# Patient Record
Sex: Male | Born: 1965 | Race: White | Hispanic: No | Marital: Married | Smoking: Current some day smoker
Health system: Southern US, Community
[De-identification: ages and names within clinical notes are randomized; demographics above are authoritative.]

## PROBLEM LIST (undated history)

## (undated) DIAGNOSIS — E11621 Type 2 diabetes mellitus with foot ulcer: Secondary | ICD-10-CM

## (undated) DIAGNOSIS — Z951 Presence of aortocoronary bypass graft: Secondary | ICD-10-CM

## (undated) DIAGNOSIS — G4733 Obstructive sleep apnea (adult) (pediatric): Secondary | ICD-10-CM

## (undated) DIAGNOSIS — I739 Peripheral vascular disease, unspecified: Secondary | ICD-10-CM

## (undated) DIAGNOSIS — Z8781 Personal history of (healed) traumatic fracture: Secondary | ICD-10-CM

## (undated) DIAGNOSIS — F32A Depression, unspecified: Secondary | ICD-10-CM

## (undated) DIAGNOSIS — Z972 Presence of dental prosthetic device (complete) (partial): Secondary | ICD-10-CM

## (undated) DIAGNOSIS — Z8739 Personal history of other diseases of the musculoskeletal system and connective tissue: Secondary | ICD-10-CM

## (undated) DIAGNOSIS — E119 Type 2 diabetes mellitus without complications: Secondary | ICD-10-CM

## (undated) DIAGNOSIS — I509 Heart failure, unspecified: Secondary | ICD-10-CM

## (undated) DIAGNOSIS — M542 Cervicalgia: Secondary | ICD-10-CM

## (undated) DIAGNOSIS — I208 Other forms of angina pectoris: Secondary | ICD-10-CM

## (undated) DIAGNOSIS — E782 Mixed hyperlipidemia: Secondary | ICD-10-CM

## (undated) DIAGNOSIS — R200 Anesthesia of skin: Secondary | ICD-10-CM

## (undated) DIAGNOSIS — Z955 Presence of coronary angioplasty implant and graft: Secondary | ICD-10-CM

## (undated) DIAGNOSIS — I2510 Atherosclerotic heart disease of native coronary artery without angina pectoris: Secondary | ICD-10-CM

## (undated) DIAGNOSIS — M069 Rheumatoid arthritis, unspecified: Secondary | ICD-10-CM

## (undated) DIAGNOSIS — R202 Paresthesia of skin: Secondary | ICD-10-CM

## (undated) DIAGNOSIS — I252 Old myocardial infarction: Secondary | ICD-10-CM

## (undated) DIAGNOSIS — F319 Bipolar disorder, unspecified: Secondary | ICD-10-CM

## (undated) DIAGNOSIS — F329 Major depressive disorder, single episode, unspecified: Secondary | ICD-10-CM

## (undated) DIAGNOSIS — R399 Unspecified symptoms and signs involving the genitourinary system: Secondary | ICD-10-CM

## (undated) DIAGNOSIS — L97509 Non-pressure chronic ulcer of other part of unspecified foot with unspecified severity: Secondary | ICD-10-CM

## (undated) DIAGNOSIS — Z87898 Personal history of other specified conditions: Secondary | ICD-10-CM

## (undated) DIAGNOSIS — K219 Gastro-esophageal reflux disease without esophagitis: Secondary | ICD-10-CM

## (undated) DIAGNOSIS — I2089 Other forms of angina pectoris: Secondary | ICD-10-CM

## (undated) DIAGNOSIS — G894 Chronic pain syndrome: Secondary | ICD-10-CM

## (undated) DIAGNOSIS — I255 Ischemic cardiomyopathy: Secondary | ICD-10-CM

## (undated) DIAGNOSIS — F1991 Other psychoactive substance use, unspecified, in remission: Secondary | ICD-10-CM

## (undated) DIAGNOSIS — N529 Male erectile dysfunction, unspecified: Secondary | ICD-10-CM

## (undated) DIAGNOSIS — F1190 Opioid use, unspecified, uncomplicated: Secondary | ICD-10-CM

## (undated) DIAGNOSIS — M869 Osteomyelitis, unspecified: Secondary | ICD-10-CM

## (undated) DIAGNOSIS — G5603 Carpal tunnel syndrome, bilateral upper limbs: Secondary | ICD-10-CM

## (undated) HISTORY — PX: LAPAROSCOPIC CHOLECYSTECTOMY: SUR755

## (undated) HISTORY — PX: TRANSTHORACIC ECHOCARDIOGRAM: SHX275

## (undated) HISTORY — PX: NASAL SINUS SURGERY: SHX719

## (undated) HISTORY — DX: Carpal tunnel syndrome, bilateral upper limbs: G56.03

## (undated) HISTORY — PX: CARDIOVASCULAR STRESS TEST: SHX262

## (undated) HISTORY — PX: TONSILLECTOMY: SUR1361

---

## 2003-12-19 HISTORY — PX: CERVICAL LAMINECTOMY: SHX94

## 2004-12-18 HISTORY — PX: OTHER SURGICAL HISTORY: SHX169

## 2013-03-10 DIAGNOSIS — M9981 Other biomechanical lesions of cervical region: Secondary | ICD-10-CM | POA: Diagnosis not present

## 2013-03-10 DIAGNOSIS — M4712 Other spondylosis with myelopathy, cervical region: Secondary | ICD-10-CM | POA: Diagnosis not present

## 2013-03-10 DIAGNOSIS — M542 Cervicalgia: Secondary | ICD-10-CM | POA: Diagnosis not present

## 2013-03-10 DIAGNOSIS — M5030 Other cervical disc degeneration, unspecified cervical region: Secondary | ICD-10-CM | POA: Diagnosis not present

## 2013-03-11 DIAGNOSIS — M5030 Other cervical disc degeneration, unspecified cervical region: Secondary | ICD-10-CM | POA: Diagnosis not present

## 2013-03-11 DIAGNOSIS — M4712 Other spondylosis with myelopathy, cervical region: Secondary | ICD-10-CM | POA: Diagnosis not present

## 2013-03-11 DIAGNOSIS — M542 Cervicalgia: Secondary | ICD-10-CM | POA: Diagnosis not present

## 2013-03-11 DIAGNOSIS — M9981 Other biomechanical lesions of cervical region: Secondary | ICD-10-CM | POA: Diagnosis not present

## 2013-03-17 DIAGNOSIS — M9981 Other biomechanical lesions of cervical region: Secondary | ICD-10-CM | POA: Diagnosis not present

## 2013-03-17 DIAGNOSIS — M542 Cervicalgia: Secondary | ICD-10-CM | POA: Diagnosis not present

## 2013-03-17 DIAGNOSIS — M4712 Other spondylosis with myelopathy, cervical region: Secondary | ICD-10-CM | POA: Diagnosis not present

## 2013-03-17 DIAGNOSIS — M5030 Other cervical disc degeneration, unspecified cervical region: Secondary | ICD-10-CM | POA: Diagnosis not present

## 2013-03-20 DIAGNOSIS — M5030 Other cervical disc degeneration, unspecified cervical region: Secondary | ICD-10-CM | POA: Diagnosis not present

## 2013-03-20 DIAGNOSIS — M542 Cervicalgia: Secondary | ICD-10-CM | POA: Diagnosis not present

## 2013-03-20 DIAGNOSIS — M4712 Other spondylosis with myelopathy, cervical region: Secondary | ICD-10-CM | POA: Diagnosis not present

## 2013-03-20 DIAGNOSIS — M9981 Other biomechanical lesions of cervical region: Secondary | ICD-10-CM | POA: Diagnosis not present

## 2013-03-25 DIAGNOSIS — N529 Male erectile dysfunction, unspecified: Secondary | ICD-10-CM | POA: Diagnosis not present

## 2013-03-25 DIAGNOSIS — M5030 Other cervical disc degeneration, unspecified cervical region: Secondary | ICD-10-CM | POA: Diagnosis not present

## 2013-03-25 DIAGNOSIS — G4730 Sleep apnea, unspecified: Secondary | ICD-10-CM | POA: Diagnosis not present

## 2013-03-25 DIAGNOSIS — M4712 Other spondylosis with myelopathy, cervical region: Secondary | ICD-10-CM | POA: Diagnosis not present

## 2013-03-25 DIAGNOSIS — M9981 Other biomechanical lesions of cervical region: Secondary | ICD-10-CM | POA: Diagnosis not present

## 2013-03-25 DIAGNOSIS — M542 Cervicalgia: Secondary | ICD-10-CM | POA: Diagnosis not present

## 2013-03-25 DIAGNOSIS — R5381 Other malaise: Secondary | ICD-10-CM | POA: Diagnosis not present

## 2013-03-25 DIAGNOSIS — E119 Type 2 diabetes mellitus without complications: Secondary | ICD-10-CM | POA: Diagnosis not present

## 2013-03-27 DIAGNOSIS — M4712 Other spondylosis with myelopathy, cervical region: Secondary | ICD-10-CM | POA: Diagnosis not present

## 2013-03-27 DIAGNOSIS — M5030 Other cervical disc degeneration, unspecified cervical region: Secondary | ICD-10-CM | POA: Diagnosis not present

## 2013-03-27 DIAGNOSIS — M542 Cervicalgia: Secondary | ICD-10-CM | POA: Diagnosis not present

## 2013-03-27 DIAGNOSIS — M9981 Other biomechanical lesions of cervical region: Secondary | ICD-10-CM | POA: Diagnosis not present

## 2013-04-02 DIAGNOSIS — M4712 Other spondylosis with myelopathy, cervical region: Secondary | ICD-10-CM | POA: Diagnosis not present

## 2013-04-02 DIAGNOSIS — M5030 Other cervical disc degeneration, unspecified cervical region: Secondary | ICD-10-CM | POA: Diagnosis not present

## 2013-04-02 DIAGNOSIS — M542 Cervicalgia: Secondary | ICD-10-CM | POA: Diagnosis not present

## 2013-04-02 DIAGNOSIS — M9981 Other biomechanical lesions of cervical region: Secondary | ICD-10-CM | POA: Diagnosis not present

## 2013-04-03 DIAGNOSIS — M9981 Other biomechanical lesions of cervical region: Secondary | ICD-10-CM | POA: Diagnosis not present

## 2013-04-03 DIAGNOSIS — M5030 Other cervical disc degeneration, unspecified cervical region: Secondary | ICD-10-CM | POA: Diagnosis not present

## 2013-04-03 DIAGNOSIS — M542 Cervicalgia: Secondary | ICD-10-CM | POA: Diagnosis not present

## 2013-04-03 DIAGNOSIS — M4712 Other spondylosis with myelopathy, cervical region: Secondary | ICD-10-CM | POA: Diagnosis not present

## 2013-04-08 DIAGNOSIS — M4712 Other spondylosis with myelopathy, cervical region: Secondary | ICD-10-CM | POA: Diagnosis not present

## 2013-04-08 DIAGNOSIS — M5030 Other cervical disc degeneration, unspecified cervical region: Secondary | ICD-10-CM | POA: Diagnosis not present

## 2013-04-08 DIAGNOSIS — M542 Cervicalgia: Secondary | ICD-10-CM | POA: Diagnosis not present

## 2013-04-08 DIAGNOSIS — M9981 Other biomechanical lesions of cervical region: Secondary | ICD-10-CM | POA: Diagnosis not present

## 2013-04-10 DIAGNOSIS — M9981 Other biomechanical lesions of cervical region: Secondary | ICD-10-CM | POA: Diagnosis not present

## 2013-04-10 DIAGNOSIS — M5030 Other cervical disc degeneration, unspecified cervical region: Secondary | ICD-10-CM | POA: Diagnosis not present

## 2013-04-10 DIAGNOSIS — M542 Cervicalgia: Secondary | ICD-10-CM | POA: Diagnosis not present

## 2013-04-10 DIAGNOSIS — M4712 Other spondylosis with myelopathy, cervical region: Secondary | ICD-10-CM | POA: Diagnosis not present

## 2013-04-15 DIAGNOSIS — M542 Cervicalgia: Secondary | ICD-10-CM | POA: Diagnosis not present

## 2013-04-15 DIAGNOSIS — M5030 Other cervical disc degeneration, unspecified cervical region: Secondary | ICD-10-CM | POA: Diagnosis not present

## 2013-04-15 DIAGNOSIS — M4712 Other spondylosis with myelopathy, cervical region: Secondary | ICD-10-CM | POA: Diagnosis not present

## 2013-04-15 DIAGNOSIS — M9981 Other biomechanical lesions of cervical region: Secondary | ICD-10-CM | POA: Diagnosis not present

## 2013-04-17 DIAGNOSIS — M542 Cervicalgia: Secondary | ICD-10-CM | POA: Diagnosis not present

## 2013-04-17 DIAGNOSIS — M5030 Other cervical disc degeneration, unspecified cervical region: Secondary | ICD-10-CM | POA: Diagnosis not present

## 2013-04-17 DIAGNOSIS — M4712 Other spondylosis with myelopathy, cervical region: Secondary | ICD-10-CM | POA: Diagnosis not present

## 2013-04-17 DIAGNOSIS — M9981 Other biomechanical lesions of cervical region: Secondary | ICD-10-CM | POA: Diagnosis not present

## 2013-04-17 DIAGNOSIS — N529 Male erectile dysfunction, unspecified: Secondary | ICD-10-CM | POA: Diagnosis not present

## 2013-04-22 DIAGNOSIS — M9981 Other biomechanical lesions of cervical region: Secondary | ICD-10-CM | POA: Diagnosis not present

## 2013-04-22 DIAGNOSIS — M542 Cervicalgia: Secondary | ICD-10-CM | POA: Diagnosis not present

## 2013-04-22 DIAGNOSIS — M5030 Other cervical disc degeneration, unspecified cervical region: Secondary | ICD-10-CM | POA: Diagnosis not present

## 2013-04-22 DIAGNOSIS — M4712 Other spondylosis with myelopathy, cervical region: Secondary | ICD-10-CM | POA: Diagnosis not present

## 2013-04-22 DIAGNOSIS — G4730 Sleep apnea, unspecified: Secondary | ICD-10-CM | POA: Diagnosis not present

## 2013-04-24 DIAGNOSIS — M9981 Other biomechanical lesions of cervical region: Secondary | ICD-10-CM | POA: Diagnosis not present

## 2013-04-24 DIAGNOSIS — M542 Cervicalgia: Secondary | ICD-10-CM | POA: Diagnosis not present

## 2013-04-24 DIAGNOSIS — M5030 Other cervical disc degeneration, unspecified cervical region: Secondary | ICD-10-CM | POA: Diagnosis not present

## 2013-04-24 DIAGNOSIS — M4712 Other spondylosis with myelopathy, cervical region: Secondary | ICD-10-CM | POA: Diagnosis not present

## 2013-04-29 DIAGNOSIS — M5030 Other cervical disc degeneration, unspecified cervical region: Secondary | ICD-10-CM | POA: Diagnosis not present

## 2013-04-29 DIAGNOSIS — M4712 Other spondylosis with myelopathy, cervical region: Secondary | ICD-10-CM | POA: Diagnosis not present

## 2013-04-29 DIAGNOSIS — M9981 Other biomechanical lesions of cervical region: Secondary | ICD-10-CM | POA: Diagnosis not present

## 2013-04-29 DIAGNOSIS — M542 Cervicalgia: Secondary | ICD-10-CM | POA: Diagnosis not present

## 2013-05-01 DIAGNOSIS — M9981 Other biomechanical lesions of cervical region: Secondary | ICD-10-CM | POA: Diagnosis not present

## 2013-05-01 DIAGNOSIS — M4712 Other spondylosis with myelopathy, cervical region: Secondary | ICD-10-CM | POA: Diagnosis not present

## 2013-05-01 DIAGNOSIS — M5030 Other cervical disc degeneration, unspecified cervical region: Secondary | ICD-10-CM | POA: Diagnosis not present

## 2013-05-01 DIAGNOSIS — M542 Cervicalgia: Secondary | ICD-10-CM | POA: Diagnosis not present

## 2013-05-06 DIAGNOSIS — M542 Cervicalgia: Secondary | ICD-10-CM | POA: Diagnosis not present

## 2013-05-06 DIAGNOSIS — M9981 Other biomechanical lesions of cervical region: Secondary | ICD-10-CM | POA: Diagnosis not present

## 2013-05-06 DIAGNOSIS — M5030 Other cervical disc degeneration, unspecified cervical region: Secondary | ICD-10-CM | POA: Diagnosis not present

## 2013-05-06 DIAGNOSIS — M4712 Other spondylosis with myelopathy, cervical region: Secondary | ICD-10-CM | POA: Diagnosis not present

## 2013-05-08 DIAGNOSIS — M542 Cervicalgia: Secondary | ICD-10-CM | POA: Diagnosis not present

## 2013-05-08 DIAGNOSIS — M4712 Other spondylosis with myelopathy, cervical region: Secondary | ICD-10-CM | POA: Diagnosis not present

## 2013-05-08 DIAGNOSIS — M9981 Other biomechanical lesions of cervical region: Secondary | ICD-10-CM | POA: Diagnosis not present

## 2013-05-08 DIAGNOSIS — M5030 Other cervical disc degeneration, unspecified cervical region: Secondary | ICD-10-CM | POA: Diagnosis not present

## 2013-06-18 DIAGNOSIS — L608 Other nail disorders: Secondary | ICD-10-CM | POA: Diagnosis not present

## 2013-06-18 DIAGNOSIS — E1149 Type 2 diabetes mellitus with other diabetic neurological complication: Secondary | ICD-10-CM | POA: Diagnosis not present

## 2013-06-18 DIAGNOSIS — M2020 Hallux rigidus, unspecified foot: Secondary | ICD-10-CM | POA: Diagnosis not present

## 2013-06-18 DIAGNOSIS — L97509 Non-pressure chronic ulcer of other part of unspecified foot with unspecified severity: Secondary | ICD-10-CM | POA: Diagnosis not present

## 2013-07-18 DIAGNOSIS — N529 Male erectile dysfunction, unspecified: Secondary | ICD-10-CM | POA: Diagnosis not present

## 2013-07-29 DIAGNOSIS — M999 Biomechanical lesion, unspecified: Secondary | ICD-10-CM | POA: Diagnosis not present

## 2013-07-29 DIAGNOSIS — M545 Low back pain: Secondary | ICD-10-CM | POA: Diagnosis not present

## 2013-07-29 DIAGNOSIS — M5137 Other intervertebral disc degeneration, lumbosacral region: Secondary | ICD-10-CM | POA: Diagnosis not present

## 2013-07-29 DIAGNOSIS — M4716 Other spondylosis with myelopathy, lumbar region: Secondary | ICD-10-CM | POA: Diagnosis not present

## 2013-08-04 DIAGNOSIS — M999 Biomechanical lesion, unspecified: Secondary | ICD-10-CM | POA: Diagnosis not present

## 2013-08-04 DIAGNOSIS — M5137 Other intervertebral disc degeneration, lumbosacral region: Secondary | ICD-10-CM | POA: Diagnosis not present

## 2013-08-04 DIAGNOSIS — M4716 Other spondylosis with myelopathy, lumbar region: Secondary | ICD-10-CM | POA: Diagnosis not present

## 2013-08-04 DIAGNOSIS — M545 Low back pain: Secondary | ICD-10-CM | POA: Diagnosis not present

## 2013-08-21 DIAGNOSIS — M5137 Other intervertebral disc degeneration, lumbosacral region: Secondary | ICD-10-CM | POA: Diagnosis not present

## 2013-08-21 DIAGNOSIS — M999 Biomechanical lesion, unspecified: Secondary | ICD-10-CM | POA: Diagnosis not present

## 2013-08-21 DIAGNOSIS — E1149 Type 2 diabetes mellitus with other diabetic neurological complication: Secondary | ICD-10-CM | POA: Diagnosis not present

## 2013-08-21 DIAGNOSIS — M545 Low back pain: Secondary | ICD-10-CM | POA: Diagnosis not present

## 2013-08-21 DIAGNOSIS — M4716 Other spondylosis with myelopathy, lumbar region: Secondary | ICD-10-CM | POA: Diagnosis not present

## 2013-08-21 DIAGNOSIS — L608 Other nail disorders: Secondary | ICD-10-CM | POA: Diagnosis not present

## 2013-09-01 DIAGNOSIS — G4730 Sleep apnea, unspecified: Secondary | ICD-10-CM | POA: Diagnosis not present

## 2013-09-01 DIAGNOSIS — R5381 Other malaise: Secondary | ICD-10-CM | POA: Diagnosis not present

## 2013-09-01 DIAGNOSIS — E119 Type 2 diabetes mellitus without complications: Secondary | ICD-10-CM | POA: Diagnosis not present

## 2013-09-04 DIAGNOSIS — M4716 Other spondylosis with myelopathy, lumbar region: Secondary | ICD-10-CM | POA: Diagnosis not present

## 2013-09-04 DIAGNOSIS — M999 Biomechanical lesion, unspecified: Secondary | ICD-10-CM | POA: Diagnosis not present

## 2013-09-04 DIAGNOSIS — M545 Low back pain: Secondary | ICD-10-CM | POA: Diagnosis not present

## 2013-09-04 DIAGNOSIS — M5137 Other intervertebral disc degeneration, lumbosacral region: Secondary | ICD-10-CM | POA: Diagnosis not present

## 2013-09-08 DIAGNOSIS — E119 Type 2 diabetes mellitus without complications: Secondary | ICD-10-CM | POA: Diagnosis not present

## 2013-09-08 DIAGNOSIS — G4730 Sleep apnea, unspecified: Secondary | ICD-10-CM | POA: Diagnosis not present

## 2013-09-08 DIAGNOSIS — F319 Bipolar disorder, unspecified: Secondary | ICD-10-CM | POA: Diagnosis not present

## 2013-09-08 DIAGNOSIS — N529 Male erectile dysfunction, unspecified: Secondary | ICD-10-CM | POA: Diagnosis not present

## 2013-09-25 DIAGNOSIS — M545 Low back pain: Secondary | ICD-10-CM | POA: Diagnosis not present

## 2013-09-25 DIAGNOSIS — M4716 Other spondylosis with myelopathy, lumbar region: Secondary | ICD-10-CM | POA: Diagnosis not present

## 2013-09-25 DIAGNOSIS — M999 Biomechanical lesion, unspecified: Secondary | ICD-10-CM | POA: Diagnosis not present

## 2013-09-25 DIAGNOSIS — M5137 Other intervertebral disc degeneration, lumbosacral region: Secondary | ICD-10-CM | POA: Diagnosis not present

## 2013-10-09 DIAGNOSIS — M4716 Other spondylosis with myelopathy, lumbar region: Secondary | ICD-10-CM | POA: Diagnosis not present

## 2013-10-09 DIAGNOSIS — M999 Biomechanical lesion, unspecified: Secondary | ICD-10-CM | POA: Diagnosis not present

## 2013-10-09 DIAGNOSIS — M545 Low back pain: Secondary | ICD-10-CM | POA: Diagnosis not present

## 2013-10-09 DIAGNOSIS — M5137 Other intervertebral disc degeneration, lumbosacral region: Secondary | ICD-10-CM | POA: Diagnosis not present

## 2014-02-19 DIAGNOSIS — E119 Type 2 diabetes mellitus without complications: Secondary | ICD-10-CM | POA: Diagnosis not present

## 2014-02-19 DIAGNOSIS — E785 Hyperlipidemia, unspecified: Secondary | ICD-10-CM | POA: Diagnosis not present

## 2014-02-26 DIAGNOSIS — N529 Male erectile dysfunction, unspecified: Secondary | ICD-10-CM | POA: Diagnosis not present

## 2014-02-26 DIAGNOSIS — G4730 Sleep apnea, unspecified: Secondary | ICD-10-CM | POA: Diagnosis not present

## 2014-02-26 DIAGNOSIS — IMO0001 Reserved for inherently not codable concepts without codable children: Secondary | ICD-10-CM | POA: Diagnosis not present

## 2014-02-26 DIAGNOSIS — E785 Hyperlipidemia, unspecified: Secondary | ICD-10-CM | POA: Diagnosis not present

## 2014-03-13 DIAGNOSIS — F3132 Bipolar disorder, current episode depressed, moderate: Secondary | ICD-10-CM | POA: Diagnosis not present

## 2014-04-28 DIAGNOSIS — F3132 Bipolar disorder, current episode depressed, moderate: Secondary | ICD-10-CM | POA: Diagnosis not present

## 2014-05-05 DIAGNOSIS — F3132 Bipolar disorder, current episode depressed, moderate: Secondary | ICD-10-CM | POA: Diagnosis not present

## 2014-05-26 DIAGNOSIS — F3132 Bipolar disorder, current episode depressed, moderate: Secondary | ICD-10-CM | POA: Diagnosis not present

## 2014-06-04 DIAGNOSIS — Z125 Encounter for screening for malignant neoplasm of prostate: Secondary | ICD-10-CM | POA: Diagnosis not present

## 2014-06-04 DIAGNOSIS — R5381 Other malaise: Secondary | ICD-10-CM | POA: Diagnosis not present

## 2014-06-04 DIAGNOSIS — E785 Hyperlipidemia, unspecified: Secondary | ICD-10-CM | POA: Diagnosis not present

## 2014-06-04 DIAGNOSIS — R5383 Other fatigue: Secondary | ICD-10-CM | POA: Diagnosis not present

## 2014-06-04 DIAGNOSIS — E119 Type 2 diabetes mellitus without complications: Secondary | ICD-10-CM | POA: Diagnosis not present

## 2014-06-11 DIAGNOSIS — E119 Type 2 diabetes mellitus without complications: Secondary | ICD-10-CM | POA: Diagnosis not present

## 2014-06-11 DIAGNOSIS — G4730 Sleep apnea, unspecified: Secondary | ICD-10-CM | POA: Diagnosis not present

## 2014-06-11 DIAGNOSIS — N529 Male erectile dysfunction, unspecified: Secondary | ICD-10-CM | POA: Diagnosis not present

## 2014-06-11 DIAGNOSIS — E785 Hyperlipidemia, unspecified: Secondary | ICD-10-CM | POA: Diagnosis not present

## 2014-06-30 DIAGNOSIS — F3132 Bipolar disorder, current episode depressed, moderate: Secondary | ICD-10-CM | POA: Diagnosis not present

## 2014-07-01 DIAGNOSIS — F3132 Bipolar disorder, current episode depressed, moderate: Secondary | ICD-10-CM | POA: Diagnosis not present

## 2014-07-07 DIAGNOSIS — F3132 Bipolar disorder, current episode depressed, moderate: Secondary | ICD-10-CM | POA: Diagnosis not present

## 2014-07-09 DIAGNOSIS — M79609 Pain in unspecified limb: Secondary | ICD-10-CM | POA: Diagnosis not present

## 2014-07-09 DIAGNOSIS — L84 Corns and callosities: Secondary | ICD-10-CM | POA: Diagnosis not present

## 2014-07-09 DIAGNOSIS — M2010 Hallux valgus (acquired), unspecified foot: Secondary | ICD-10-CM | POA: Diagnosis not present

## 2014-07-09 DIAGNOSIS — E1149 Type 2 diabetes mellitus with other diabetic neurological complication: Secondary | ICD-10-CM | POA: Diagnosis not present

## 2014-07-14 DIAGNOSIS — F3132 Bipolar disorder, current episode depressed, moderate: Secondary | ICD-10-CM | POA: Diagnosis not present

## 2014-08-18 DIAGNOSIS — F3132 Bipolar disorder, current episode depressed, moderate: Secondary | ICD-10-CM | POA: Diagnosis not present

## 2014-08-18 DIAGNOSIS — F4310 Post-traumatic stress disorder, unspecified: Secondary | ICD-10-CM | POA: Diagnosis not present

## 2014-09-01 DIAGNOSIS — F4310 Post-traumatic stress disorder, unspecified: Secondary | ICD-10-CM | POA: Diagnosis not present

## 2014-09-01 DIAGNOSIS — F3132 Bipolar disorder, current episode depressed, moderate: Secondary | ICD-10-CM | POA: Diagnosis not present

## 2014-09-18 DIAGNOSIS — E785 Hyperlipidemia, unspecified: Secondary | ICD-10-CM | POA: Diagnosis not present

## 2014-09-18 DIAGNOSIS — E119 Type 2 diabetes mellitus without complications: Secondary | ICD-10-CM | POA: Diagnosis not present

## 2014-09-18 DIAGNOSIS — E780 Pure hypercholesterolemia: Secondary | ICD-10-CM | POA: Diagnosis not present

## 2014-09-24 DIAGNOSIS — E1165 Type 2 diabetes mellitus with hyperglycemia: Secondary | ICD-10-CM | POA: Diagnosis not present

## 2014-09-24 DIAGNOSIS — N5201 Erectile dysfunction due to arterial insufficiency: Secondary | ICD-10-CM | POA: Diagnosis not present

## 2014-09-24 DIAGNOSIS — E782 Mixed hyperlipidemia: Secondary | ICD-10-CM | POA: Diagnosis not present

## 2014-09-24 DIAGNOSIS — M159 Polyosteoarthritis, unspecified: Secondary | ICD-10-CM | POA: Diagnosis not present

## 2014-12-04 DIAGNOSIS — F3132 Bipolar disorder, current episode depressed, moderate: Secondary | ICD-10-CM | POA: Diagnosis not present

## 2014-12-07 DIAGNOSIS — E1165 Type 2 diabetes mellitus with hyperglycemia: Secondary | ICD-10-CM | POA: Diagnosis not present

## 2014-12-07 DIAGNOSIS — M159 Polyosteoarthritis, unspecified: Secondary | ICD-10-CM | POA: Diagnosis not present

## 2014-12-30 DIAGNOSIS — F3132 Bipolar disorder, current episode depressed, moderate: Secondary | ICD-10-CM | POA: Diagnosis not present

## 2015-01-13 DIAGNOSIS — F3132 Bipolar disorder, current episode depressed, moderate: Secondary | ICD-10-CM | POA: Diagnosis not present

## 2015-01-19 DIAGNOSIS — E1165 Type 2 diabetes mellitus with hyperglycemia: Secondary | ICD-10-CM | POA: Diagnosis not present

## 2015-01-19 DIAGNOSIS — E782 Mixed hyperlipidemia: Secondary | ICD-10-CM | POA: Diagnosis not present

## 2015-01-26 DIAGNOSIS — E1165 Type 2 diabetes mellitus with hyperglycemia: Secondary | ICD-10-CM | POA: Diagnosis not present

## 2015-01-26 DIAGNOSIS — E782 Mixed hyperlipidemia: Secondary | ICD-10-CM | POA: Diagnosis not present

## 2015-01-26 DIAGNOSIS — N5201 Erectile dysfunction due to arterial insufficiency: Secondary | ICD-10-CM | POA: Diagnosis not present

## 2015-02-08 DIAGNOSIS — F3132 Bipolar disorder, current episode depressed, moderate: Secondary | ICD-10-CM | POA: Diagnosis not present

## 2015-02-17 DIAGNOSIS — F3132 Bipolar disorder, current episode depressed, moderate: Secondary | ICD-10-CM | POA: Diagnosis not present

## 2015-03-03 DIAGNOSIS — F3132 Bipolar disorder, current episode depressed, moderate: Secondary | ICD-10-CM | POA: Diagnosis not present

## 2015-03-04 DIAGNOSIS — F314 Bipolar disorder, current episode depressed, severe, without psychotic features: Secondary | ICD-10-CM | POA: Diagnosis not present

## 2015-03-22 DIAGNOSIS — F3132 Bipolar disorder, current episode depressed, moderate: Secondary | ICD-10-CM | POA: Diagnosis not present

## 2015-03-31 DIAGNOSIS — F3132 Bipolar disorder, current episode depressed, moderate: Secondary | ICD-10-CM | POA: Diagnosis not present

## 2015-04-12 DIAGNOSIS — F3132 Bipolar disorder, current episode depressed, moderate: Secondary | ICD-10-CM | POA: Diagnosis not present

## 2015-04-21 DIAGNOSIS — F3132 Bipolar disorder, current episode depressed, moderate: Secondary | ICD-10-CM | POA: Diagnosis not present

## 2015-05-04 DIAGNOSIS — E1165 Type 2 diabetes mellitus with hyperglycemia: Secondary | ICD-10-CM | POA: Diagnosis not present

## 2015-05-04 DIAGNOSIS — E782 Mixed hyperlipidemia: Secondary | ICD-10-CM | POA: Diagnosis not present

## 2015-05-04 DIAGNOSIS — N5201 Erectile dysfunction due to arterial insufficiency: Secondary | ICD-10-CM | POA: Diagnosis not present

## 2015-05-11 DIAGNOSIS — F3132 Bipolar disorder, current episode depressed, moderate: Secondary | ICD-10-CM | POA: Diagnosis not present

## 2015-05-11 DIAGNOSIS — E1165 Type 2 diabetes mellitus with hyperglycemia: Secondary | ICD-10-CM | POA: Diagnosis not present

## 2015-05-11 DIAGNOSIS — M159 Polyosteoarthritis, unspecified: Secondary | ICD-10-CM | POA: Diagnosis not present

## 2015-05-11 DIAGNOSIS — M2550 Pain in unspecified joint: Secondary | ICD-10-CM | POA: Diagnosis not present

## 2015-05-11 DIAGNOSIS — R634 Abnormal weight loss: Secondary | ICD-10-CM | POA: Diagnosis not present

## 2015-05-11 DIAGNOSIS — E782 Mixed hyperlipidemia: Secondary | ICD-10-CM | POA: Diagnosis not present

## 2015-05-11 DIAGNOSIS — M791 Myalgia: Secondary | ICD-10-CM | POA: Diagnosis not present

## 2015-05-18 ENCOUNTER — Other Ambulatory Visit: Payer: Self-pay | Admitting: Internal Medicine

## 2015-05-18 DIAGNOSIS — M159 Polyosteoarthritis, unspecified: Secondary | ICD-10-CM | POA: Diagnosis not present

## 2015-05-18 DIAGNOSIS — M2550 Pain in unspecified joint: Secondary | ICD-10-CM | POA: Diagnosis not present

## 2015-05-18 DIAGNOSIS — R634 Abnormal weight loss: Secondary | ICD-10-CM | POA: Diagnosis not present

## 2015-05-18 DIAGNOSIS — Z202 Contact with and (suspected) exposure to infections with a predominantly sexual mode of transmission: Secondary | ICD-10-CM | POA: Diagnosis not present

## 2015-05-20 ENCOUNTER — Ambulatory Visit
Admission: RE | Admit: 2015-05-20 | Discharge: 2015-05-20 | Disposition: A | Discharge status: Home / Self Care | Payer: Medicare Other | Source: Ambulatory Visit | Attending: Internal Medicine | Admitting: Internal Medicine

## 2015-05-20 DIAGNOSIS — R634 Abnormal weight loss: Secondary | ICD-10-CM | POA: Diagnosis not present

## 2015-05-20 DIAGNOSIS — N200 Calculus of kidney: Secondary | ICD-10-CM | POA: Diagnosis not present

## 2015-05-20 MED ORDER — IOPAMIDOL (ISOVUE-300) INJECTION 61%
125.0000 mL | Freq: Once | INTRAVENOUS | Status: AC | PRN
  Administered 2015-05-20: 125 mL via INTRAVENOUS

## 2015-05-26 DIAGNOSIS — F3132 Bipolar disorder, current episode depressed, moderate: Secondary | ICD-10-CM | POA: Diagnosis not present

## 2015-06-01 DIAGNOSIS — M159 Polyosteoarthritis, unspecified: Secondary | ICD-10-CM | POA: Diagnosis not present

## 2015-06-01 DIAGNOSIS — R634 Abnormal weight loss: Secondary | ICD-10-CM | POA: Diagnosis not present

## 2015-06-01 DIAGNOSIS — E782 Mixed hyperlipidemia: Secondary | ICD-10-CM | POA: Diagnosis not present

## 2015-06-01 DIAGNOSIS — E1165 Type 2 diabetes mellitus with hyperglycemia: Secondary | ICD-10-CM | POA: Diagnosis not present

## 2015-06-22 DIAGNOSIS — R768 Other specified abnormal immunological findings in serum: Secondary | ICD-10-CM | POA: Diagnosis not present

## 2015-06-22 DIAGNOSIS — R5383 Other fatigue: Secondary | ICD-10-CM | POA: Diagnosis not present

## 2015-06-22 DIAGNOSIS — M79642 Pain in left hand: Secondary | ICD-10-CM | POA: Diagnosis not present

## 2015-06-22 DIAGNOSIS — M2550 Pain in unspecified joint: Secondary | ICD-10-CM | POA: Diagnosis not present

## 2015-06-22 DIAGNOSIS — M79641 Pain in right hand: Secondary | ICD-10-CM | POA: Diagnosis not present

## 2015-06-22 DIAGNOSIS — M2560 Stiffness of unspecified joint, not elsewhere classified: Secondary | ICD-10-CM | POA: Diagnosis not present

## 2015-06-23 DIAGNOSIS — F3132 Bipolar disorder, current episode depressed, moderate: Secondary | ICD-10-CM | POA: Diagnosis not present

## 2015-06-23 DIAGNOSIS — F4310 Post-traumatic stress disorder, unspecified: Secondary | ICD-10-CM | POA: Diagnosis not present

## 2015-07-05 DIAGNOSIS — F3132 Bipolar disorder, current episode depressed, moderate: Secondary | ICD-10-CM | POA: Diagnosis not present

## 2015-07-05 DIAGNOSIS — F4310 Post-traumatic stress disorder, unspecified: Secondary | ICD-10-CM | POA: Diagnosis not present

## 2015-07-19 DIAGNOSIS — F3132 Bipolar disorder, current episode depressed, moderate: Secondary | ICD-10-CM | POA: Diagnosis not present

## 2015-07-25 DIAGNOSIS — I499 Cardiac arrhythmia, unspecified: Secondary | ICD-10-CM | POA: Diagnosis not present

## 2015-07-25 DIAGNOSIS — K088 Other specified disorders of teeth and supporting structures: Secondary | ICD-10-CM | POA: Diagnosis not present

## 2015-07-26 DIAGNOSIS — E119 Type 2 diabetes mellitus without complications: Secondary | ICD-10-CM | POA: Diagnosis not present

## 2015-07-26 DIAGNOSIS — H0289 Other specified disorders of eyelid: Secondary | ICD-10-CM | POA: Diagnosis not present

## 2015-07-26 DIAGNOSIS — H16149 Punctate keratitis, unspecified eye: Secondary | ICD-10-CM | POA: Diagnosis not present

## 2015-07-26 DIAGNOSIS — Z79899 Other long term (current) drug therapy: Secondary | ICD-10-CM | POA: Diagnosis not present

## 2015-08-05 DIAGNOSIS — F3132 Bipolar disorder, current episode depressed, moderate: Secondary | ICD-10-CM | POA: Diagnosis not present

## 2015-08-13 DIAGNOSIS — F3132 Bipolar disorder, current episode depressed, moderate: Secondary | ICD-10-CM | POA: Diagnosis not present

## 2015-08-17 DIAGNOSIS — M2560 Stiffness of unspecified joint, not elsewhere classified: Secondary | ICD-10-CM | POA: Diagnosis not present

## 2015-09-02 DIAGNOSIS — Z79899 Other long term (current) drug therapy: Secondary | ICD-10-CM | POA: Diagnosis not present

## 2015-09-02 DIAGNOSIS — G894 Chronic pain syndrome: Secondary | ICD-10-CM | POA: Diagnosis not present

## 2015-09-02 DIAGNOSIS — E669 Obesity, unspecified: Secondary | ICD-10-CM | POA: Diagnosis not present

## 2015-09-02 DIAGNOSIS — M47812 Spondylosis without myelopathy or radiculopathy, cervical region: Secondary | ICD-10-CM | POA: Diagnosis not present

## 2015-09-02 DIAGNOSIS — M542 Cervicalgia: Secondary | ICD-10-CM | POA: Diagnosis not present

## 2015-09-14 DIAGNOSIS — R768 Other specified abnormal immunological findings in serum: Secondary | ICD-10-CM | POA: Diagnosis not present

## 2015-09-14 DIAGNOSIS — M2550 Pain in unspecified joint: Secondary | ICD-10-CM | POA: Diagnosis not present

## 2015-09-14 DIAGNOSIS — M2560 Stiffness of unspecified joint, not elsewhere classified: Secondary | ICD-10-CM | POA: Diagnosis not present

## 2015-09-15 DIAGNOSIS — M47812 Spondylosis without myelopathy or radiculopathy, cervical region: Secondary | ICD-10-CM | POA: Diagnosis not present

## 2015-09-23 DIAGNOSIS — M5412 Radiculopathy, cervical region: Secondary | ICD-10-CM | POA: Diagnosis not present

## 2015-09-23 DIAGNOSIS — R200 Anesthesia of skin: Secondary | ICD-10-CM | POA: Diagnosis not present

## 2015-10-04 DIAGNOSIS — F3132 Bipolar disorder, current episode depressed, moderate: Secondary | ICD-10-CM | POA: Diagnosis not present

## 2015-11-04 DIAGNOSIS — F3132 Bipolar disorder, current episode depressed, moderate: Secondary | ICD-10-CM | POA: Diagnosis not present

## 2015-11-18 DIAGNOSIS — M47812 Spondylosis without myelopathy or radiculopathy, cervical region: Secondary | ICD-10-CM | POA: Diagnosis not present

## 2015-11-24 DIAGNOSIS — F3132 Bipolar disorder, current episode depressed, moderate: Secondary | ICD-10-CM | POA: Diagnosis not present

## 2015-12-01 DIAGNOSIS — F3132 Bipolar disorder, current episode depressed, moderate: Secondary | ICD-10-CM | POA: Diagnosis not present

## 2015-12-30 DIAGNOSIS — F3132 Bipolar disorder, current episode depressed, moderate: Secondary | ICD-10-CM | POA: Diagnosis not present

## 2016-01-12 ENCOUNTER — Emergency Department (HOSPITAL_COMMUNITY): Payer: Medicare Other

## 2016-01-12 ENCOUNTER — Inpatient Hospital Stay (HOSPITAL_COMMUNITY)
Admission: EM | Admit: 2016-01-12 | Discharge: 2016-01-27 | DRG: 234 | Disposition: A | Discharge status: Home / Self Care | Payer: Medicare Other | Attending: Cardiothoracic Surgery | Admitting: Cardiothoracic Surgery

## 2016-01-12 ENCOUNTER — Encounter (HOSPITAL_COMMUNITY): Payer: Self-pay | Admitting: Emergency Medicine

## 2016-01-12 DIAGNOSIS — E8770 Fluid overload, unspecified: Secondary | ICD-10-CM | POA: Diagnosis present

## 2016-01-12 DIAGNOSIS — E1165 Type 2 diabetes mellitus with hyperglycemia: Secondary | ICD-10-CM | POA: Diagnosis not present

## 2016-01-12 DIAGNOSIS — M7989 Other specified soft tissue disorders: Secondary | ICD-10-CM | POA: Diagnosis not present

## 2016-01-12 DIAGNOSIS — R748 Abnormal levels of other serum enzymes: Secondary | ICD-10-CM | POA: Diagnosis not present

## 2016-01-12 DIAGNOSIS — Z7984 Long term (current) use of oral hypoglycemic drugs: Secondary | ICD-10-CM

## 2016-01-12 DIAGNOSIS — I472 Ventricular tachycardia: Secondary | ICD-10-CM | POA: Diagnosis present

## 2016-01-12 DIAGNOSIS — R7989 Other specified abnormal findings of blood chemistry: Secondary | ICD-10-CM

## 2016-01-12 DIAGNOSIS — E871 Hypo-osmolality and hyponatremia: Secondary | ICD-10-CM | POA: Diagnosis present

## 2016-01-12 DIAGNOSIS — D62 Acute posthemorrhagic anemia: Secondary | ICD-10-CM | POA: Diagnosis not present

## 2016-01-12 DIAGNOSIS — I255 Ischemic cardiomyopathy: Secondary | ICD-10-CM | POA: Diagnosis present

## 2016-01-12 DIAGNOSIS — E11621 Type 2 diabetes mellitus with foot ulcer: Secondary | ICD-10-CM | POA: Diagnosis present

## 2016-01-12 DIAGNOSIS — I214 Non-ST elevation (NSTEMI) myocardial infarction: Secondary | ICD-10-CM | POA: Diagnosis not present

## 2016-01-12 DIAGNOSIS — Z01810 Encounter for preprocedural cardiovascular examination: Secondary | ICD-10-CM | POA: Diagnosis not present

## 2016-01-12 DIAGNOSIS — I25810 Atherosclerosis of coronary artery bypass graft(s) without angina pectoris: Secondary | ICD-10-CM | POA: Insufficient documentation

## 2016-01-12 DIAGNOSIS — E118 Type 2 diabetes mellitus with unspecified complications: Secondary | ICD-10-CM

## 2016-01-12 DIAGNOSIS — R008 Other abnormalities of heart beat: Secondary | ICD-10-CM | POA: Diagnosis present

## 2016-01-12 DIAGNOSIS — Z882 Allergy status to sulfonamides status: Secondary | ICD-10-CM | POA: Diagnosis not present

## 2016-01-12 DIAGNOSIS — R778 Other specified abnormalities of plasma proteins: Secondary | ICD-10-CM

## 2016-01-12 DIAGNOSIS — Z7952 Long term (current) use of systemic steroids: Secondary | ICD-10-CM

## 2016-01-12 DIAGNOSIS — Z794 Long term (current) use of insulin: Secondary | ICD-10-CM

## 2016-01-12 DIAGNOSIS — D696 Thrombocytopenia, unspecified: Secondary | ICD-10-CM | POA: Diagnosis not present

## 2016-01-12 DIAGNOSIS — Z09 Encounter for follow-up examination after completed treatment for conditions other than malignant neoplasm: Secondary | ICD-10-CM

## 2016-01-12 DIAGNOSIS — I499 Cardiac arrhythmia, unspecified: Secondary | ICD-10-CM | POA: Diagnosis not present

## 2016-01-12 DIAGNOSIS — J90 Pleural effusion, not elsewhere classified: Secondary | ICD-10-CM | POA: Diagnosis not present

## 2016-01-12 DIAGNOSIS — Z951 Presence of aortocoronary bypass graft: Secondary | ICD-10-CM

## 2016-01-12 DIAGNOSIS — I080 Rheumatic disorders of both mitral and aortic valves: Secondary | ICD-10-CM | POA: Diagnosis not present

## 2016-01-12 DIAGNOSIS — I2510 Atherosclerotic heart disease of native coronary artery without angina pectoris: Secondary | ICD-10-CM

## 2016-01-12 DIAGNOSIS — Z79899 Other long term (current) drug therapy: Secondary | ICD-10-CM | POA: Diagnosis not present

## 2016-01-12 DIAGNOSIS — I25110 Atherosclerotic heart disease of native coronary artery with unstable angina pectoris: Secondary | ICD-10-CM | POA: Diagnosis present

## 2016-01-12 DIAGNOSIS — E785 Hyperlipidemia, unspecified: Secondary | ICD-10-CM | POA: Diagnosis present

## 2016-01-12 DIAGNOSIS — R079 Chest pain, unspecified: Secondary | ICD-10-CM

## 2016-01-12 DIAGNOSIS — F319 Bipolar disorder, unspecified: Secondary | ICD-10-CM | POA: Diagnosis not present

## 2016-01-12 DIAGNOSIS — R509 Fever, unspecified: Secondary | ICD-10-CM | POA: Diagnosis not present

## 2016-01-12 DIAGNOSIS — R0789 Other chest pain: Secondary | ICD-10-CM | POA: Diagnosis not present

## 2016-01-12 DIAGNOSIS — Z01818 Encounter for other preprocedural examination: Secondary | ICD-10-CM

## 2016-01-12 DIAGNOSIS — F17290 Nicotine dependence, other tobacco product, uncomplicated: Secondary | ICD-10-CM | POA: Diagnosis present

## 2016-01-12 DIAGNOSIS — L97519 Non-pressure chronic ulcer of other part of right foot with unspecified severity: Secondary | ICD-10-CM | POA: Diagnosis present

## 2016-01-12 DIAGNOSIS — R0602 Shortness of breath: Secondary | ICD-10-CM | POA: Diagnosis not present

## 2016-01-12 DIAGNOSIS — I252 Old myocardial infarction: Secondary | ICD-10-CM

## 2016-01-12 DIAGNOSIS — IMO0002 Reserved for concepts with insufficient information to code with codable children: Secondary | ICD-10-CM | POA: Diagnosis present

## 2016-01-12 HISTORY — DX: Bipolar disorder, unspecified: F31.9

## 2016-01-12 HISTORY — DX: Gastro-esophageal reflux disease without esophagitis: K21.9

## 2016-01-12 HISTORY — DX: Old myocardial infarction: I25.2

## 2016-01-12 LAB — RAPID URINE DRUG SCREEN, HOSP PERFORMED
AMPHETAMINES: NOT DETECTED
Barbiturates: NOT DETECTED
Benzodiazepines: NOT DETECTED
COCAINE: NOT DETECTED
OPIATES: NOT DETECTED
Tetrahydrocannabinol: NOT DETECTED

## 2016-01-12 LAB — BASIC METABOLIC PANEL
Anion gap: 13 (ref 5–15)
BUN: 17 mg/dL (ref 6–20)
CHLORIDE: 98 mmol/L — AB (ref 101–111)
CO2: 25 mmol/L (ref 22–32)
CREATININE: 1.08 mg/dL (ref 0.61–1.24)
Calcium: 9.8 mg/dL (ref 8.9–10.3)
GFR calc Af Amer: >60 mL/min (ref >60)
GFR calc non Af Amer: >60 mL/min (ref >60)
GLUCOSE: 361 mg/dL — AB (ref 65–99)
POTASSIUM: 4.6 mmol/L (ref 3.5–5.1)
SODIUM: 136 mmol/L (ref 135–145)

## 2016-01-12 LAB — BRAIN NATRIURETIC PEPTIDE: B Natriuretic Peptide: 85.2 pg/mL (ref 0.0–100.0)

## 2016-01-12 LAB — GLUCOSE, CAPILLARY: GLUCOSE-CAPILLARY: 232 mg/dL — AB (ref 65–99)

## 2016-01-12 LAB — I-STAT TROPONIN, ED: Troponin i, poc: 0.86 ng/mL (ref 0.00–0.08)

## 2016-01-12 LAB — PROTIME-INR
INR: 0.92 (ref 0.00–1.49)
PROTHROMBIN TIME: 12.6 s (ref 11.6–15.2)

## 2016-01-12 LAB — CBC
HEMATOCRIT: 47.3 % (ref 39.0–52.0)
HEMOGLOBIN: 16.2 g/dL (ref 13.0–17.0)
MCH: 30.7 pg (ref 26.0–34.0)
MCHC: 34.2 g/dL (ref 30.0–36.0)
MCV: 89.8 fL (ref 78.0–100.0)
PLATELETS: 187 10*3/uL (ref 150–400)
RBC: 5.27 MIL/uL (ref 4.22–5.81)
RDW: 13 % (ref 11.5–15.5)
WBC: 9.5 10*3/uL (ref 4.0–10.5)

## 2016-01-12 LAB — TROPONIN I
Troponin I: 1.19 ng/mL (ref <0.031)
Troponin I: 4.26 ng/mL (ref <0.031)

## 2016-01-12 LAB — APTT: APTT: 28 s (ref 24–37)

## 2016-01-12 MED ORDER — ACETAMINOPHEN 325 MG PO TABS
650.0000 mg | ORAL_TABLET | ORAL | Status: DC | PRN
  Administered 2016-01-13: 650 mg via ORAL
  Filled 2016-01-12: qty 2

## 2016-01-12 MED ORDER — HEPARIN (PORCINE) IN NACL 100-0.45 UNIT/ML-% IJ SOLN
1650.0000 [IU]/h | INTRAMUSCULAR | Status: DC
  Administered 2016-01-12: 1500 [IU]/h via INTRAVENOUS
  Filled 2016-01-12 (×2): qty 250

## 2016-01-12 MED ORDER — ONDANSETRON HCL 4 MG/2ML IJ SOLN: 4.0000 mg | Freq: Four times a day (QID) | INTRAMUSCULAR | Status: DC | PRN

## 2016-01-12 MED ORDER — HEPARIN BOLUS VIA INFUSION
4000.0000 [IU] | Freq: Once | INTRAVENOUS | Status: AC
  Administered 2016-01-12: 4000 [IU] via INTRAVENOUS
  Filled 2016-01-12: qty 4000

## 2016-01-12 MED ORDER — INSULIN ASPART 100 UNIT/ML ~~LOC~~ SOLN
0.0000 [IU] | Freq: Every day | SUBCUTANEOUS | Status: DC
  Administered 2016-01-12 – 2016-01-13 (×2): 2 [IU] via SUBCUTANEOUS
  Administered 2016-01-17: 4 [IU] via SUBCUTANEOUS

## 2016-01-12 MED ORDER — PREDNISOLONE 5 MG PO TABS: 5.0000 mg | ORAL_TABLET | Freq: Every day | ORAL | Status: DC

## 2016-01-12 MED ORDER — PREDNISONE 10 MG PO TABS
5.0000 mg | ORAL_TABLET | Freq: Every day | ORAL | Status: DC
  Administered 2016-01-12 – 2016-01-21 (×10): 5 mg via ORAL
  Filled 2016-01-12 (×10): qty 1

## 2016-01-12 MED ORDER — ATORVASTATIN CALCIUM 40 MG PO TABS
40.0000 mg | ORAL_TABLET | Freq: Every day | ORAL | Status: DC
  Administered 2016-01-12: 40 mg via ORAL
  Filled 2016-01-12: qty 1

## 2016-01-12 MED ORDER — TRAZODONE HCL 100 MG PO TABS
100.0000 mg | ORAL_TABLET | Freq: Every day | ORAL | Status: DC
  Administered 2016-01-12 – 2016-01-26 (×15): 100 mg via ORAL
  Filled 2016-01-12: qty 1
  Filled 2016-01-12: qty 2
  Filled 2016-01-12 (×7): qty 1
  Filled 2016-01-12: qty 2
  Filled 2016-01-12 (×3): qty 1
  Filled 2016-01-12: qty 2
  Filled 2016-01-12: qty 1

## 2016-01-12 MED ORDER — NITROGLYCERIN 0.4 MG SL SUBL
0.4000 mg | SUBLINGUAL_TABLET | SUBLINGUAL | Status: DC | PRN
  Administered 2016-01-12: 0.4 mg via SUBLINGUAL
  Filled 2016-01-12: qty 1

## 2016-01-12 MED ORDER — NITROGLYCERIN 2 % TD OINT
1.0000 [in_us] | TOPICAL_OINTMENT | Freq: Once | TRANSDERMAL | Status: AC
  Administered 2016-01-12: 1 [in_us] via TOPICAL
  Filled 2016-01-12: qty 1

## 2016-01-12 MED ORDER — TICAGRELOR 90 MG PO TABS
180.0000 mg | ORAL_TABLET | Freq: Once | ORAL | Status: AC
  Administered 2016-01-12: 180 mg via ORAL
  Filled 2016-01-12: qty 2

## 2016-01-12 MED ORDER — INSULIN ASPART 100 UNIT/ML ~~LOC~~ SOLN
0.0000 [IU] | Freq: Three times a day (TID) | SUBCUTANEOUS | Status: DC
  Administered 2016-01-13: 07:00:00 5 [IU] via SUBCUTANEOUS
  Administered 2016-01-13: 3 [IU] via SUBCUTANEOUS
  Administered 2016-01-14: 5 [IU] via SUBCUTANEOUS
  Administered 2016-01-14: 3 [IU] via SUBCUTANEOUS
  Administered 2016-01-14 – 2016-01-15 (×2): 5 [IU] via SUBCUTANEOUS
  Administered 2016-01-15 (×2): 3 [IU] via SUBCUTANEOUS
  Administered 2016-01-16: 2 [IU] via SUBCUTANEOUS
  Administered 2016-01-16: 3 [IU] via SUBCUTANEOUS
  Administered 2016-01-16: 5 [IU] via SUBCUTANEOUS
  Administered 2016-01-17 – 2016-01-19 (×7): 3 [IU] via SUBCUTANEOUS

## 2016-01-12 MED ORDER — ASPIRIN 81 MG PO CHEW
324.0000 mg | CHEWABLE_TABLET | Freq: Once | ORAL | Status: AC
  Administered 2016-01-12: 324 mg via ORAL
  Filled 2016-01-12: qty 4

## 2016-01-12 MED ORDER — TICAGRELOR 90 MG PO TABS
90.0000 mg | ORAL_TABLET | Freq: Two times a day (BID) | ORAL | Status: DC
  Administered 2016-01-13: 90 mg via ORAL
  Filled 2016-01-12: qty 1

## 2016-01-12 MED ORDER — NITROGLYCERIN IN D5W 200-5 MCG/ML-% IV SOLN
3.0000 ug/min | INTRAVENOUS | Status: DC
  Administered 2016-01-12 – 2016-01-18 (×2): 5 ug/min via INTRAVENOUS
  Filled 2016-01-12 (×3): qty 250

## 2016-01-12 MED ORDER — METOPROLOL TARTRATE 12.5 MG HALF TABLET
12.5000 mg | ORAL_TABLET | Freq: Two times a day (BID) | ORAL | Status: DC
Start: 2016-01-12 — End: 2016-01-13
  Administered 2016-01-12 – 2016-01-13 (×2): 12.5 mg via ORAL
  Filled 2016-01-12 (×2): qty 1

## 2016-01-12 MED ORDER — ASPIRIN EC 81 MG PO TBEC: 81.0000 mg | DELAYED_RELEASE_TABLET | Freq: Every day | ORAL | Status: DC

## 2016-01-12 MED ORDER — ALUM & MAG HYDROXIDE-SIMETH 200-200-20 MG/5ML PO SUSP
30.0000 mL | ORAL | Status: DC | PRN
  Filled 2016-01-12: qty 30

## 2016-01-12 NOTE — ED Notes (Addendum)
C/o sharp pain to center of chest that radiates to L neck and L arm with sob and dizziness since 4:30 pm.  Reports intermittent pain x 1 week.

## 2016-01-12 NOTE — Consult Note (Signed)
ANTICOAGULATION CONSULT NOTE - Initial Consult  Pharmacy Consult for Heparin Indication: chest pain/ACS  Allergies not on file  Patient Measurements: Height: 6\' 3"  (190.5 cm) Weight: 260 lb (117.935 kg) IBW/kg (Calculated) : 84.5 Heparin Dosing Weight: 109kg  Vital Signs: Temp: 98.7 F (37.1 C) (01/25 1941) Temp Source: Oral (01/25 1941) BP: 155/73 mmHg (01/25 1941) Pulse Rate: 100 (01/25 1941)  Labs:  Recent Labs  01/12/16 1949  HGB 16.2  HCT 47.3  PLT 187  CREATININE 1.08    Estimated Creatinine Clearance: 113.3 mL/min (by C-G formula based on Cr of 1.08).   Medical History: Past Medical History  Diagnosis Date  . Drug abuse   . Irregular heart beat   . Diabetes mellitus without complication (HCC)   . Bipolar 1 disorder (HCC)     Medications:  No anticoagulants pta  Assessment: 49yom presents to the ED with chest pain that radiates to his left neck and arm. First troponin positive at 0.86. He will begin IV heparin. Baseline labs wnl.  Goal of Therapy:  Heparin level 0.3-0.7 units/ml Monitor platelets by anticoagulation protocol: Yes   Plan:  1) Heparin bolus 4000 units x 1 2) Heparin drip at 1500 units/hr 3) Check 6 hour heparin level 4) Daily heparin level and CBC  01/14/16 01/12/2016,8:17 PM

## 2016-01-12 NOTE — ED Provider Notes (Signed)
CSN: [id number]     Arrival date & time 01/12/16  1931 History   First MD Initiated Contact with Patient 01/12/16 2002     Chief Complaint  Patient presents with  . Chest Pain     (Consider location/radiation/quality/duration/timing/severity/associated sxs/prior Treatment) Patient is a 50 y.o. male presenting with chest pain. The history is provided by the patient.  Chest Pain Pain location:  Substernal area Pain quality: pressure   Pain radiates to:  L shoulder and L jaw Pain radiates to the back: no   Pain severity:  Moderate Onset quality:  Sudden Duration:  1 hour Timing:  Constant Progression:  Waxing and waning Chronicity:  Recurrent (has had several times over last week) Context: at rest   Relieved by:  Nothing Worsened by:  Nothing tried Ineffective treatments:  None tried Associated symptoms: no abdominal pain, no back pain, no cough, no lower extremity edema, no shortness of breath and not vomiting   Risk factors: diabetes mellitus, high cholesterol and smoking (cigars)   Risk factors comment:  Father with ACS at young age   Past Medical History  Diagnosis Date  . Drug abuse   . Irregular heart beat   . Diabetes mellitus without complication (HCC)   . Bipolar 1 disorder [facility])    Past Surgical History  Procedure Laterality Date  . Leg surgery    . Neck surgery    . Nasal sinus surgery    . Wrist surgery    . Cholecystectomy     No family history on file. Social History  Substance Use Topics  . Smoking status: Current Every Day Smoker    Types: Cigars  . Smokeless tobacco: None  . Alcohol Use: No    Review of Systems  Respiratory: Negative for cough and shortness of breath.   Cardiovascular: Positive for chest pain.  Gastrointestinal: Negative for vomiting and abdominal pain.  Musculoskeletal: Negative for back pain.  All other systems reviewed and are negative.     Allergies  Review of patient's allergies indicates not on file.  Home  Medications   Prior to Admission medications   Not on File   BP 155/73 mmHg  Pulse 100  Temp(Src) 98.7 F (37.1 C) (Oral)  Resp 18  Ht 6\' 3"  (1.905 m)  Wt 260 lb (117.935 kg)  BMI 32.50 kg/m2  SpO2 100% Physical Exam  Constitutional: He is oriented to person, place, and time. He appears well-developed and well-nourished. No distress.  HENT:  Head: Normocephalic and atraumatic.  Eyes: Conjunctivae are normal.  Neck: Neck supple. No tracheal deviation present.  Cardiovascular: Normal rate, regular rhythm and normal heart sounds.   Pulmonary/Chest: Effort normal and breath sounds normal. No respiratory distress.  Abdominal: Soft. He exhibits no distension.  Neurological: He is alert and oriented to person, place, and time.  Skin: Skin is warm and dry.  Psychiatric: He has a normal mood and affect.  Vitals reviewed.   ED Course  Procedures (including critical care time)  CRITICAL CARE Performed by: Total critical care time: 30 minutes Critical care time was exclusive of separately billable procedures and treating other patients. Critical care was necessary to treat or prevent imminent or life-threatening deterioration. Critical care was time spent personally by me on the following activities: development of treatment plan with patient and/or surrogate as well as nursing, discussions with consultants, evaluation of patient's response to treatment, examination of patient, obtaining history from patient or surrogate, ordering and performing treatments and interventions,  ordering and review of laboratory studies, ordering and review of radiographic studies, pulse oximetry and re-evaluation of patient's condition.   Labs Review Labs Reviewed  Rosezena Sensor, ED - Abnormal; Notable for the following:    Troponin i, poc 0.86 (*)    All other components within normal limits  CBC  BASIC METABOLIC PANEL  TROPONIN I    Imaging Review Dg Chest Port 1  View  01/12/2016  CLINICAL DATA:  Chest pain radiating into left arm for several weeks. Shortness of breath. EXAM: PORTABLE CHEST 1 VIEW COMPARISON:  May 18, 2015 FINDINGS: Lungs are clear. Heart size and pulmonary vascularity are normal. No adenopathy. No bone lesions. No pneumothorax. IMPRESSION: No edema or consolidation. Electronically Signed   By: Bretta Bang III M.D.   On: 01/12/2016 20:48   I have personally reviewed and evaluated these images and lab results as part of my medical decision-making.   EKG Interpretation   Date/Time:  Wednesday January 12 2016 19:36:34 EST Ventricular Rate:  104 PR Interval:  176 QRS Duration: 100 QT Interval:  354 QTC Calculation: 465 R Axis:   -30 Text Interpretation:  Sinus tachycardia with frequent Premature  ventricular complexes Left axis deviation Cannot rule out Anterior infarct  , age undetermined Abnormal ECG No previous tracing Confirmed by Navdeep Halt MD,  Sie Formisano [phone]) on 01/12/2016 8:02:24 PM      MDM   Final diagnoses:  Chest pain  NSTEMI (non-ST elevated myocardial infarction) (HCC)  Troponin level elevated    50 y.o. male presents with central chest pain radiating to L arm intermittently but acutely worse over last few hours. Currently having improved pain. Given SL NTG and paste applied. Given ASA, heparinized. EKG without active ischemic findings but troponin elevated. Cardiology consulted for NSTEMI, recommending Pt get brilinta and will plan for cath. Admitted to cardiology.    Lyndal Pulley, MD 01/13/16 [phone]

## 2016-01-12 NOTE — H&P (Signed)
CC: CP  HPI: 62 [place] man with T2DMx12 years, dyslipidemia, and possible RA (on prednisone x few months as was not able to tolerate some other meds before), prior history of alcohol and illicit drugs including cocaine, active tobacco smoker (cigars), presents with CP. Reports intermittent mid-sternal and epigastric pressure and ache for the past couple months associated with nausea, sweating over head and SOB. Tried OTC antiacids. Today symptoms were severe and lasted over 3 hours before he presented to ER. No prior history of CAD, MI, HF, CVA, cancer, bleeding. No upcoming surgery. Does not recall stress test or heart cath in the past. He reports having abnormal rhythm in the past. Never saw a cardiologist. Denies any recent use of illicit drugs.   Reports trauma to left leg requiring coiling of one artery in his thigh some years ago.   In the ER today, his POC TnI was 0.8 and lab TnI is 1.19,  CBC and renal function within normal limits. ECG: NSR, PVCs. Normal AV conduction, left axis, no ST elevation   Review of Systems:  10 systems reviewed unremarkable except as noted in HPI    Past Medical History  Diagnosis Date  . Drug abuse   . Irregular heart beat   . Diabetes mellitus without complication (HCC)   . Bipolar 1 disorder (HCC)     Home Meds:  Insulin Metformin Trazodone  Prednisone Trazodone   Allergies  Allergen Reactions  . Sulfa Antibiotics Other (See Comments)    Childhood     Social History   Social History  . Marital Status: Married    Spouse Name: N/A  . Number of Children: N/A  . Years of Education: N/A   Occupational History  . Not on file.   Social History Main Topics  . Smoking status: Current Every Day Smoker    Types: Cigars  . Smokeless tobacco: Not on file  . Alcohol Use: No  . Drug Use: Yes    Special: Cocaine  . Sexual Activity: Not on file   Other Topics Concern  . Not on file   Social History Narrative  . No narrative on file     No family history on file.  PHYSICAL EXAM: Filed Vitals:   01/12/16 1941  BP: 155/73  Pulse: 100  Temp: 98.7 F (37.1 C)  Resp: 18   General:  Well appearing. No respiratory difficulty HEENT: normal Neck: supple. no JVD. Carotids 2+ bilat; no bruits. No lymphadenopathy or thryomegaly appreciated. Cor: PMI nondisplaced. Regular rate & rhythm. No rubs, gallops or murmurs. Lungs: clear Abdomen: soft, nontender, nondistended. No hepatosplenomegaly. No bruits or masses. Good bowel sounds. Extremities: no cyanosis, clubbing, rash, edema Neuro: alert & oriented x 3, cranial nerves grossly intact. moves all 4 extremities w/o difficulty. Affect pleasant.   Results for orders placed or performed during the hospital encounter of 01/12/16 (from the past 24 hour(s))  Basic metabolic panel     Status: Abnormal   Collection Time: 01/12/16  7:49 PM  Result Value Ref Range   Sodium 136 135 - 145 mmol/L   Potassium 4.6 3.5 - 5.1 mmol/L   Chloride 98 (L) 101 - 111 mmol/L   CO2 25 22 - 32 mmol/L   Glucose, Bld 361 (H) 65 - 99 mg/dL   BUN 17 6 - 20 mg/dL   Creatinine, Ser 7.68 0.61 - 1.24 mg/dL   Calcium 9.8 8.9 - 11.5 mg/dL   GFR calc non Af Amer >60 >60 mL/min  GFR calc Af Amer >60 >60 mL/min   Anion gap 13 5 - 15  I-stat troponin, ED (not at [facility], [facility])     Status: Abnormal   Collection Time: 01/12/16  7:49 PM  Result Value Ref Range   Troponin i, poc 0.86 (HH) 0.00 - 0.08 ng/mL   Comment NOTIFIED PHYSICIAN    Comment 3          CBC     Status: None   Collection Time: 01/12/16  7:49 PM  Result Value Ref Range   WBC 9.5 4.0 - 10.5 K/uL   RBC 5.27 4.22 - 5.81 MIL/uL   Hemoglobin 16.2 13.0 - 17.0 g/dL   HCT 35.4 65.6 - 81.2 %   MCV 89.8 78.0 - 100.0 fL   MCH 30.7 26.0 - 34.0 pg   MCHC 34.2 30.0 - 36.0 g/dL   RDW 75.1 70.0 - 17.4 %   Platelets 187 150 - 400 K/uL   No results found.   ASSESSMENT:  1. NSTE-ACS - Normal BP - No overt HF or unstable arrhtythmia  2. T2DM  and dyslipidemia 3. Chronic Prednisone for questionable RA 4. Prior history of illicit drug use   PLAN/DISCUSSION:  Please refer to orders for details. He will be admitted to cardiology in step down. ASA and Brilinta loaded in ER and we will continue those. Heparin and NTG infusion are being initiated. Lipitor. SSI. Check BNP, echo, TSH, urine drug screen. Serial TnI.  Plan for cath in am. Discussed risks and benefits of procedure. He agrees to proceed.   Counseled for complete smoking cessation.   Nevin Bloodgood, MD Cardiology

## 2016-01-12 NOTE — Progress Notes (Signed)
UR COMPLETED  

## 2016-01-13 ENCOUNTER — Inpatient Hospital Stay (HOSPITAL_COMMUNITY): Payer: Medicare Other

## 2016-01-13 ENCOUNTER — Encounter (HOSPITAL_COMMUNITY)
Admission: EM | Disposition: A | Discharge status: Home / Self Care | Payer: Self-pay | Source: Home / Self Care | Attending: Cardiothoracic Surgery

## 2016-01-13 ENCOUNTER — Encounter (HOSPITAL_COMMUNITY): Payer: Self-pay | Admitting: General Practice

## 2016-01-13 DIAGNOSIS — R079 Chest pain, unspecified: Secondary | ICD-10-CM

## 2016-01-13 DIAGNOSIS — I2510 Atherosclerotic heart disease of native coronary artery without angina pectoris: Secondary | ICD-10-CM

## 2016-01-13 DIAGNOSIS — R778 Other specified abnormalities of plasma proteins: Secondary | ICD-10-CM | POA: Insufficient documentation

## 2016-01-13 DIAGNOSIS — E118 Type 2 diabetes mellitus with unspecified complications: Secondary | ICD-10-CM

## 2016-01-13 DIAGNOSIS — IMO0002 Reserved for concepts with insufficient information to code with codable children: Secondary | ICD-10-CM | POA: Diagnosis present

## 2016-01-13 DIAGNOSIS — I472 Ventricular tachycardia: Secondary | ICD-10-CM

## 2016-01-13 DIAGNOSIS — R7989 Other specified abnormal findings of blood chemistry: Secondary | ICD-10-CM

## 2016-01-13 DIAGNOSIS — E1165 Type 2 diabetes mellitus with hyperglycemia: Secondary | ICD-10-CM | POA: Diagnosis present

## 2016-01-13 DIAGNOSIS — E785 Hyperlipidemia, unspecified: Secondary | ICD-10-CM

## 2016-01-13 HISTORY — PX: CARDIAC CATHETERIZATION: SHX172

## 2016-01-13 LAB — HEPATIC FUNCTION PANEL
ALBUMIN: 3.6 g/dL (ref 3.5–5.0)
ALK PHOS: 54 U/L (ref 38–126)
ALT: 20 U/L (ref 17–63)
AST: 47 U/L — AB (ref 15–41)
BILIRUBIN DIRECT: 0.2 mg/dL (ref 0.1–0.5)
BILIRUBIN TOTAL: 0.9 mg/dL (ref 0.3–1.2)
Indirect Bilirubin: 0.7 mg/dL (ref 0.3–0.9)
Total Protein: 6.2 g/dL — ABNORMAL LOW (ref 6.5–8.1)

## 2016-01-13 LAB — GLUCOSE, CAPILLARY
GLUCOSE-CAPILLARY: 228 mg/dL — AB (ref 65–99)
GLUCOSE-CAPILLARY: 165 mg/dL — AB (ref 65–99)
GLUCOSE-CAPILLARY: 206 mg/dL — AB (ref 65–99)

## 2016-01-13 LAB — BASIC METABOLIC PANEL
ANION GAP: 10 (ref 5–15)
BUN: 14 mg/dL (ref 6–20)
CALCIUM: 9.3 mg/dL (ref 8.9–10.3)
CHLORIDE: 103 mmol/L (ref 101–111)
CO2: 25 mmol/L (ref 22–32)
Creatinine, Ser: 0.95 mg/dL (ref 0.61–1.24)
GFR calc non Af Amer: >60 mL/min (ref >60)
GLUCOSE: 254 mg/dL — AB (ref 65–99)
POTASSIUM: 4.7 mmol/L (ref 3.5–5.1)
Sodium: 138 mmol/L (ref 135–145)

## 2016-01-13 LAB — LIPID PANEL
CHOL/HDL RATIO: 5.5 ratio
Cholesterol: 188 mg/dL (ref 0–200)
HDL: 34 mg/dL — AB (ref >40)
LDL CALC: 89 mg/dL (ref 0–99)
TRIGLYCERIDES: 326 mg/dL — AB (ref <150)
VLDL: 65 mg/dL — AB (ref 0–40)

## 2016-01-13 LAB — CBC
HEMATOCRIT: 46.3 % (ref 39.0–52.0)
Hemoglobin: 15.9 g/dL (ref 13.0–17.0)
MCH: 30.5 pg (ref 26.0–34.0)
MCHC: 34.3 g/dL (ref 30.0–36.0)
MCV: 88.7 fL (ref 78.0–100.0)
PLATELETS: 167 10*3/uL (ref 150–400)
RBC: 5.22 MIL/uL (ref 4.22–5.81)
RDW: 13 % (ref 11.5–15.5)
WBC: 10.9 10*3/uL — AB (ref 4.0–10.5)

## 2016-01-13 LAB — TROPONIN I
TROPONIN I: 5.95 ng/mL — AB (ref <0.031)
Troponin I: 7.78 ng/mL (ref <0.031)
Troponin I: 7.21 ng/mL (ref <0.031)

## 2016-01-13 LAB — TSH: TSH: 2.137 u[IU]/mL (ref 0.350–4.500)

## 2016-01-13 LAB — MAGNESIUM: Magnesium: 2.1 mg/dL (ref 1.7–2.4)

## 2016-01-13 LAB — HEPARIN LEVEL (UNFRACTIONATED): Heparin Unfractionated: 0.27 IU/mL — ABNORMAL LOW (ref 0.30–0.70)

## 2016-01-13 LAB — MRSA PCR SCREENING: MRSA by PCR: NEGATIVE

## 2016-01-13 SURGERY — LEFT HEART CATH AND CORONARY ANGIOGRAPHY

## 2016-01-13 MED ORDER — MIDAZOLAM HCL 2 MG/2ML IJ SOLN
INTRAMUSCULAR | Status: AC
  Filled 2016-01-13: qty 2

## 2016-01-13 MED ORDER — VERAPAMIL HCL 2.5 MG/ML IV SOLN
INTRAVENOUS | Status: DC | PRN
  Administered 2016-01-13: 18:00:00 via INTRA_ARTERIAL

## 2016-01-13 MED ORDER — HEPARIN (PORCINE) IN NACL 2-0.9 UNIT/ML-% IJ SOLN
INTRAMUSCULAR | Status: AC
  Filled 2016-01-13: qty 1000

## 2016-01-13 MED ORDER — ASPIRIN 81 MG PO CHEW
81.0000 mg | CHEWABLE_TABLET | Freq: Every day | ORAL | Status: DC
  Administered 2016-01-14 – 2016-01-19 (×6): 81 mg via ORAL
  Filled 2016-01-13 (×6): qty 1

## 2016-01-13 MED ORDER — MIDAZOLAM HCL 2 MG/2ML IJ SOLN
INTRAMUSCULAR | Status: DC | PRN
  Administered 2016-01-13 (×3): 1 mg via INTRAVENOUS

## 2016-01-13 MED ORDER — ASPIRIN 81 MG PO CHEW
81.0000 mg | CHEWABLE_TABLET | ORAL | Status: AC
  Administered 2016-01-13: 11:00:00 81 mg via ORAL
  Filled 2016-01-13 (×2): qty 1

## 2016-01-13 MED ORDER — LIDOCAINE HCL (PF) 1 % IJ SOLN
INTRAMUSCULAR | Status: AC
  Filled 2016-01-13: qty 30

## 2016-01-13 MED ORDER — ATORVASTATIN CALCIUM 80 MG PO TABS
80.0000 mg | ORAL_TABLET | Freq: Every day | ORAL | Status: DC
  Administered 2016-01-14 – 2016-01-26 (×12): 80 mg via ORAL
  Filled 2016-01-13 (×11): qty 1

## 2016-01-13 MED ORDER — SODIUM CHLORIDE 0.9 % IV SOLN
INTRAVENOUS | Status: DC
  Administered 2016-01-13 (×2): via INTRAVENOUS

## 2016-01-13 MED ORDER — ~~LOC~~ CARDIAC SURGERY, PATIENT & FAMILY EDUCATION
Freq: Once | Status: AC
  Administered 2016-01-13: 21:00:00
  Filled 2016-01-13: qty 1

## 2016-01-13 MED ORDER — IOHEXOL 350 MG/ML SOLN
INTRAVENOUS | Status: DC | PRN
  Administered 2016-01-13: 95 mL via INTRAVENOUS

## 2016-01-13 MED ORDER — METOPROLOL TARTRATE 25 MG PO TABS
25.0000 mg | ORAL_TABLET | Freq: Two times a day (BID) | ORAL | Status: DC
  Administered 2016-01-13 – 2016-01-19 (×12): 25 mg via ORAL
  Filled 2016-01-13 (×13): qty 1

## 2016-01-13 MED ORDER — LIDOCAINE HCL (PF) 1 % IJ SOLN
INTRAMUSCULAR | Status: DC | PRN
  Administered 2016-01-13: 18:00:00

## 2016-01-13 MED ORDER — NITROGLYCERIN 1 MG/10 ML FOR IR/CATH LAB
INTRA_ARTERIAL | Status: DC | PRN
  Administered 2016-01-13: 200 ug via INTRACORONARY

## 2016-01-13 MED ORDER — VERAPAMIL HCL 2.5 MG/ML IV SOLN
INTRAVENOUS | Status: AC
  Filled 2016-01-13: qty 2

## 2016-01-13 MED ORDER — SODIUM CHLORIDE 0.9% FLUSH: 3.0000 mL | INTRAVENOUS | Status: DC | PRN

## 2016-01-13 MED ORDER — HEPARIN SODIUM (PORCINE) 1000 UNIT/ML IJ SOLN
INTRAMUSCULAR | Status: AC
  Filled 2016-01-13: qty 1

## 2016-01-13 MED ORDER — ACETAMINOPHEN 325 MG PO TABS
650.0000 mg | ORAL_TABLET | ORAL | Status: DC | PRN
  Administered 2016-01-15: 07:00:00 650 mg via ORAL
  Filled 2016-01-13 (×2): qty 2

## 2016-01-13 MED ORDER — HEPARIN (PORCINE) IN NACL 100-0.45 UNIT/ML-% IJ SOLN
1900.0000 [IU]/h | INTRAMUSCULAR | Status: DC
  Administered 2016-01-14: 1650 [IU]/h via INTRAVENOUS
  Administered 2016-01-14 – 2016-01-16 (×4): 1850 [IU]/h via INTRAVENOUS
  Administered 2016-01-18 – 2016-01-19 (×4): 1900 [IU]/h via INTRAVENOUS
  Filled 2016-01-13 (×11): qty 250

## 2016-01-13 MED ORDER — SODIUM CHLORIDE 0.9% FLUSH: 3.0000 mL | Freq: Two times a day (BID) | INTRAVENOUS | Status: DC

## 2016-01-13 MED ORDER — HEPARIN SODIUM (PORCINE) 1000 UNIT/ML IJ SOLN
INTRAMUSCULAR | Status: DC | PRN
  Administered 2016-01-13: 5000 [IU] via INTRAVENOUS

## 2016-01-13 MED ORDER — SODIUM CHLORIDE 0.9 % IV SOLN: 250.0000 mL | INTRAVENOUS | Status: DC | PRN

## 2016-01-13 MED ORDER — SODIUM CHLORIDE 0.9% FLUSH
3.0000 mL | Freq: Two times a day (BID) | INTRAVENOUS | Status: DC
  Administered 2016-01-13: 12:00:00 3 mL via INTRAVENOUS

## 2016-01-13 MED ORDER — METOPROLOL TARTRATE 12.5 MG HALF TABLET
12.5000 mg | ORAL_TABLET | Freq: Once | ORAL | Status: AC
  Administered 2016-01-13: 12:00:00 12.5 mg via ORAL
  Filled 2016-01-13: qty 1

## 2016-01-13 MED ORDER — ANGIOPLASTY BOOK
Freq: Once | Status: AC
  Administered 2016-01-13: 11:00:00
  Filled 2016-01-13: qty 1

## 2016-01-13 MED ORDER — ASPIRIN 81 MG PO CHEW: 81.0000 mg | CHEWABLE_TABLET | ORAL | Status: DC

## 2016-01-13 MED ORDER — SODIUM CHLORIDE 0.9 % IV SOLN: INTRAVENOUS | Status: AC

## 2016-01-13 SURGICAL SUPPLY — 9 items
CATH INFINITI 5 FR JL3.5 (CATHETERS) ×3 IMPLANT
CATH INFINITI JR4 5F (CATHETERS) ×3 IMPLANT
DEVICE RAD COMP TR BAND LRG (VASCULAR PRODUCTS) ×3 IMPLANT
GLIDESHEATH SLEND A-KIT 6F 22G (SHEATH) ×3 IMPLANT
KIT HEART LEFT (KITS) ×3 IMPLANT
PACK CARDIAC CATHETERIZATION (CUSTOM PROCEDURE TRAY) ×3 IMPLANT
TRANSDUCER W/STOPCOCK (MISCELLANEOUS) ×3 IMPLANT
TUBING CIL FLEX 10 FLL-RA (TUBING) ×3 IMPLANT
WIRE SAFE-T 1.5MM-J .035X260CM (WIRE) ×3 IMPLANT

## 2016-01-13 NOTE — Interval H&P Note (Signed)
Cath Lab Visit (complete for each Cath Lab visit)  Clinical Evaluation Leading to the Procedure:   ACS: Yes.    Non-ACS:    Anginal Classification: CCS III  Anti-ischemic medical therapy: Maximal Therapy (2 or more classes of medications)  Non-Invasive Test Results: No non-invasive testing performed  Prior CABG: Previous CABG      History and Physical Interval Note:  01/13/2016 5:03 PM  Austin Arnold  has presented today for surgery, with the diagnosis of non stemi  The various methods of treatment have been discussed with the patient and family. After consideration of risks, benefits and other options for treatment, the patient has consented to  Procedure(s): Left Heart Cath and Coronary Angiography (N/A) as a surgical intervention .  The patient's history has been reviewed, patient examined, no change in status, stable for surgery.  I have reviewed the patient's chart and labs.  Questions were answered to the patient's satisfaction.     Lesleigh Noe

## 2016-01-13 NOTE — H&P (View-Only) (Signed)
Patient Name: Austin Arnold Date of Encounter: 01/13/2016   SUBJECTIVE  Feeling well. No chest pain, sob or palpitations.   CURRENT MEDS . aspirin EC  81 mg Oral Daily  . atorvastatin  40 mg Oral q1800  . insulin aspart  0-15 Units Subcutaneous TID WC  . insulin aspart  0-5 Units Subcutaneous QHS  . metoprolol tartrate  12.5 mg Oral BID  . predniSONE  5 mg Oral QHS  . ticagrelor  90 mg Oral BID  . traZODone  100 mg Oral QHS    OBJECTIVE  Filed Vitals:   01/12/16 2200 01/12/16 2201 01/13/16 0349 01/13/16 0759  BP: 158/97 158/97 109/67 109/59  Pulse: 96 87 72 80  Temp:  98.1 F (36.7 C) 97.7 F (36.5 C) 97.7 F (36.5 C)  TempSrc:  Oral Oral Oral  Resp: 18 15 11 21   Height:  6\' 3"  (1.905 m)    Weight:  254 lb 13.6 oz (115.6 kg) 254 lb 13.6 oz (115.6 kg)   SpO2: 99% 99% 100% 99%    Intake/Output Summary (Last 24 hours) at 01/13/16 0839 Last data filed at 01/13/16 0709  Gross per 24 hour  Intake  171.5 ml  Output    800 ml  Net -628.5 ml   Filed Weights   01/12/16 1941 01/12/16 2201 01/13/16 0349  Weight: 260 lb (117.935 kg) 254 lb 13.6 oz (115.6 kg) 254 lb 13.6 oz (115.6 kg)    PHYSICAL EXAM  General: Pleasant, NAD. Neuro: Alert and oriented X 3. Moves all extremities spontaneously. Psych: Normal affect. HEENT:  Normal  Neck: Supple without bruits or JVD. Lungs:  Resp regular and unlabored, CTA. Heart: RRR no s3, s4, or murmurs. Abdomen: Soft, non-tender, non-distended, BS + x 4.  Extremities: No clubbing, cyanosis or edema. DP/PT/Radials 2+ and equal bilaterally.  Accessory Clinical Findings  CBC  Recent Labs  01/12/16 1949 01/13/16 0342  WBC 9.5 10.9*  HGB 16.2 15.9  HCT 47.3 46.3  MCV 89.8 88.7  PLT 187 167   Basic Metabolic Panel  Recent Labs  01/12/16 1949 01/13/16 0342  NA 136 138  K 4.6 4.7  CL 98* 103  CO2 25 25  GLUCOSE 361* 254*  BUN 17 14  CREATININE 1.08 0.95  CALCIUM 9.8 9.3   Cardiac Enzymes  Recent Labs  01/12/16 1944 01/12/16 2305 01/13/16 0342  TROPONINI 1.19* 4.26* 5.95*  Fasting Lipid Panel  Recent Labs  01/13/16 0342  CHOL 188  HDL 34*  LDLCALC 89  TRIG 01/15/16*  CHOLHDL 5.5   Thyroid Function Tests  Recent Labs  01/12/16 2305  TSH 2.137    TELE  Sinus rhythm with frequent small beats of NSVT and ventricular bigemeny  Radiology/Studies  Dg Chest Port 1 View  01/12/2016  CLINICAL DATA:  Chest pain radiating into left arm for several weeks. Shortness of breath. EXAM: PORTABLE CHEST 1 VIEW COMPARISON:  May 18, 2015 FINDINGS: Lungs are clear. Heart size and pulmonary vascularity are normal. No adenopathy. No bone lesions. No pneumothorax. IMPRESSION: No edema or consolidation. Electronically Signed   By: 01/14/2016 III M.D.   On: 01/12/2016 20:48    ASSESSMENT AND PLAN   1. NSTEMI (non-ST elevated myocardial infarction) (HCC) - Troponin trending up. Continue cycle troponin. Currently chest pain free. For cath later today.  - Continue 81, lipitor 80mg , IV heparin, IV nitro, Briliinta and metoprolol 12.5mg  BID. ASA and Brilinta loaded in ER.  - TSH normal  2. HL -  01/13/2016: Cholesterol 188; HDL 34*; LDL Cholesterol 89; Triglycerides 326*; VLDL 65*  - Lipitor 80mg . Check lipid panel and LFT in 4-6 weeks.   3. NSVT (frequent small beats) and ventricular bigeminy - K normal. Will get Mg. Continue BB. For cath.   4. DM - On SSI. Pending A1C  5. Chronic Prednisone for questionable RA  6. Prior history of illicit drug use: Quit 10 years ago. UDS negative  7. Tobacco abuse - Smokes 4-6 cigars a day. Willing to quit. Not interested in assistance yet.    The patient understands that risks include but are not limited to stroke (1 in 1000), death (1 in 1000), kidney failure [usually temporary] (1 in 500), bleeding (1 in 200), allergic reaction [possibly serious] (1 in 200), and agrees to proceed.   Signed, Bhagat,Bhavinkumar PA-C Pager [phone]  As above,  patient seen and examined. Patient denies chest pain or dyspnea at present. He has ruled in for a non-ST elevation myocardial infarction. Continue aspirin, heparin and statin. Frequent ectopy noted on telemetry. Increase metoprolol to 25 mg twice a day. Proceed with cardiac catheterization today. The risks and benefits were discussed and he agrees to proceed.Patient counseled on discontinuing tobacco use. [phone]

## 2016-01-13 NOTE — Progress Notes (Signed)
  Echocardiogram 2D Echocardiogram has been performed.  Austin Arnold 01/13/2016, 3:15 PM

## 2016-01-13 NOTE — Progress Notes (Signed)
Inpatient Diabetes Program Recommendations  AACE/ADA: New Consensus Statement on Inpatient Glycemic Control (2015)  Target Ranges:  Prepandial:   less than 140 mg/dL      Peak postprandial:   less than 180 mg/dL (1-2 hours)      Critically ill patients:  140 - 180 mg/dL   Results for Austin Arnold, Austin Arnold (MRN [id number]) as of 01/13/2016 10:26  Ref. Range 01/12/2016 22:22 01/13/2016 07:11  Glucose-Capillary Latest Ref Range: 65-99 mg/dL 889 (H) 169 (H)     Admit with: CP  History: DM  Home DM Meds: Toujeo basal insulin 48 units daily       Metformin 1000 mg bid  Current Insulin Orders: Novolog Moderate SSI (0-15 units) TID AC + HS     MD- Note patient NPO this AM for cardiac cath.    Please consider staring at least 50% of patient's home dose of basal insulin tonight.  Recommend Lantus 24 units QHS     --Will follow patient during hospitalization--  Ambrose Finland RN, MSN, CDE Diabetes Coordinator Inpatient Glycemic Control Team Team Pager: [phone] (8a-5p)

## 2016-01-13 NOTE — Consult Note (Signed)
ANTICOAGULATION CONSULT NOTE  Pharmacy Consult for Heparin Indication: chest pain/ACS  Allergies  Allergen Reactions  . Sulfa Antibiotics Other (See Comments)    Childhood     Patient Measurements: Height: 6\' 3"  (190.5 cm) Weight: 254 lb 13.6 oz (115.6 kg) IBW/kg (Calculated) : 84.5 Heparin Dosing Weight: 109kg  Vital Signs: Temp: 97.7 F (36.5 C) (01/26 1625) Temp Source: Oral (01/26 1625) BP: 107/66 mmHg (01/26 1759) Pulse Rate: 73 (01/26 1759)  Labs:  Recent Labs  01/12/16 1949 01/12/16 2142 01/12/16 2305 01/13/16 0342 01/13/16 1019  HGB 16.2  --   --  15.9  --   HCT 47.3  --   --  46.3  --   PLT 187  --   --  167  --   APTT  --  28  --   --   --   LABPROT  --  12.6  --   --   --   INR  --  0.92  --   --   --   HEPARINUNFRC  --   --   --  0.27*  --   CREATININE 1.08  --   --  0.95  --   TROPONINI  --   --  4.26* 5.95* 7.78*    Estimated Creatinine Clearance: 127.5 mL/min (by C-G formula based on Cr of 0.95).   Assessment: 49yom presents to the ED with chest pain that radiates to his left neck and arm. First troponin positive at 0.86. He will begin IV heparin. Baseline labs wnl. Initial HL = 0.27. Pt underwent cath this afternoon and pharmacy is consulted to restart heparin 8h post sheath removal. Sheath was removed at approximately 1800.   Goal of Therapy:  Heparin level 0.3-0.7 units/ml Monitor platelets by anticoagulation protocol: Yes   Plan:  Start heparin 1650 units/hr 6h HL Daily HL/CBC  01/15/16. Arlean Hopping, PharmD, BCPS Clinical Pharmacist Pager [phone] 01/13/2016,6:57 PM

## 2016-01-13 NOTE — Progress Notes (Signed)
 Patient Name: Austin Arnold Date of Encounter: 01/13/2016   SUBJECTIVE  Feeling well. No chest pain, sob or palpitations.   CURRENT MEDS . aspirin EC  81 mg Oral Daily  . atorvastatin  40 mg Oral q1800  . insulin aspart  0-15 Units Subcutaneous TID WC  . insulin aspart  0-5 Units Subcutaneous QHS  . metoprolol tartrate  12.5 mg Oral BID  . predniSONE  5 mg Oral QHS  . ticagrelor  90 mg Oral BID  . traZODone  100 mg Oral QHS    OBJECTIVE  Filed Vitals:   01/12/16 2200 01/12/16 2201 01/13/16 0349 01/13/16 0759  BP: 158/97 158/97 109/67 109/59  Pulse: 96 87 72 80  Temp:  98.1 F (36.7 C) 97.7 F (36.5 C) 97.7 F (36.5 C)  TempSrc:  Oral Oral Oral  Resp: 18 15 11 21  Height:  6' 3" (1.905 m)    Weight:  254 lb 13.6 oz (115.6 kg) 254 lb 13.6 oz (115.6 kg)   SpO2: 99% 99% 100% 99%    Intake/Output Summary (Last 24 hours) at 01/13/16 0839 Last data filed at 01/13/16 0709  Gross per 24 hour  Intake  171.5 ml  Output    800 ml  Net -628.5 ml   Filed Weights   01/12/16 1941 01/12/16 2201 01/13/16 0349  Weight: 260 lb (117.935 kg) 254 lb 13.6 oz (115.6 kg) 254 lb 13.6 oz (115.6 kg)    PHYSICAL EXAM  General: Pleasant, NAD. Neuro: Alert and oriented X 3. Moves all extremities spontaneously. Psych: Normal affect. HEENT:  Normal  Neck: Supple without bruits or JVD. Lungs:  Resp regular and unlabored, CTA. Heart: RRR no s3, s4, or murmurs. Abdomen: Soft, non-tender, non-distended, BS + x 4.  Extremities: No clubbing, cyanosis or edema. DP/PT/Radials 2+ and equal bilaterally.  Accessory Clinical Findings  CBC  Recent Labs  01/12/16 1949 01/13/16 0342  WBC 9.5 10.9*  HGB 16.2 15.9  HCT 47.3 46.3  MCV 89.8 88.7  PLT 187 167   Basic Metabolic Panel  Recent Labs  01/12/16 1949 01/13/16 0342  NA 136 138  K 4.6 4.7  CL 98* 103  CO2 25 25  GLUCOSE 361* 254*  BUN 17 14  CREATININE 1.08 0.95  CALCIUM 9.8 9.3   Cardiac Enzymes  Recent Labs  01/12/16 1944 01/12/16 2305 01/13/16 0342  TROPONINI 1.19* 4.26* 5.95*  Fasting Lipid Panel  Recent Labs  01/13/16 0342  CHOL 188  HDL 34*  LDLCALC 89  TRIG 326*  CHOLHDL 5.5   Thyroid Function Tests  Recent Labs  01/12/16 2305  TSH 2.137    TELE  Sinus rhythm with frequent small beats of NSVT and ventricular bigemeny  Radiology/Studies  Dg Chest Port 1 View  01/12/2016  CLINICAL DATA:  Chest pain radiating into left arm for several weeks. Shortness of breath. EXAM: PORTABLE CHEST 1 VIEW COMPARISON:  May 18, 2015 FINDINGS: Lungs are clear. Heart size and pulmonary vascularity are normal. No adenopathy. No bone lesions. No pneumothorax. IMPRESSION: No edema or consolidation. Electronically Signed   By: William  Woodruff III M.D.   On: 01/12/2016 20:48    ASSESSMENT AND PLAN   1. NSTEMI (non-ST elevated myocardial infarction) (HCC) - Troponin trending up. Continue cycle troponin. Currently chest pain free. For cath later today.  - Continue 81, lipitor 80mg, IV heparin, IV nitro, Briliinta and metoprolol 12.5mg BID. ASA and Brilinta loaded in ER.  - TSH normal  2. HL -   01/13/2016: Cholesterol 188; HDL 34*; LDL Cholesterol 89; Triglycerides 326*; VLDL 65*  - Lipitor 80mg . Check lipid panel and LFT in 4-6 weeks.   3. NSVT (frequent small beats) and ventricular bigeminy - K normal. Will get Mg. Continue BB. For cath.   4. DM - On SSI. Pending A1C  5. Chronic Prednisone for questionable RA  6. Prior history of illicit drug use: Quit 10 years ago. UDS negative  7. Tobacco abuse - Smokes 4-6 cigars a day. Willing to quit. Not interested in assistance yet.    The patient understands that risks include but are not limited to stroke (1 in 1000), death (1 in 1000), kidney failure [usually temporary] (1 in 500), bleeding (1 in 200), allergic reaction [possibly serious] (1 in 200), and agrees to proceed.   Signed, Bhagat,Bhavinkumar PA-C Pager [phone]  As above,  patient seen and examined. Patient denies chest pain or dyspnea at present. He has ruled in for a non-ST elevation myocardial infarction. Continue aspirin, heparin and statin. Frequent ectopy noted on telemetry. Increase metoprolol to 25 mg twice a day. Proceed with cardiac catheterization today. The risks and benefits were discussed and he agrees to proceed.Patient counseled on discontinuing tobacco use. [phone]

## 2016-01-13 NOTE — Consult Note (Signed)
ANTICOAGULATION CONSULT NOTE  Pharmacy Consult for Heparin Indication: chest pain/ACS  Allergies  Allergen Reactions  . Sulfa Antibiotics Other (See Comments)    Childhood     Patient Measurements: Height: 6\' 3"  (190.5 cm) Weight: 254 lb 13.6 oz (115.6 kg) IBW/kg (Calculated) : 84.5 Heparin Dosing Weight: 109kg  Vital Signs: Temp: 97.7 F (36.5 C) (01/26 0349) Temp Source: Oral (01/26 0349) BP: 109/67 mmHg (01/26 0349) Pulse Rate: 72 (01/26 0349)  Labs:  Recent Labs  01/12/16 1944 01/12/16 1949 01/12/16 2142 01/12/16 2305 01/13/16 0342  HGB  --  16.2  --   --  15.9  HCT  --  47.3  --   --  46.3  PLT  --  187  --   --  167  APTT  --   --  28  --   --   LABPROT  --   --  12.6  --   --   INR  --   --  0.92  --   --   HEPARINUNFRC  --   --   --   --  0.27*  CREATININE  --  1.08  --   --   --   TROPONINI 1.19*  --   --  4.26*  --     Estimated Creatinine Clearance: 112.2 mL/min (by C-G formula based on Cr of 1.08).   Assessment: 49yom presents to the ED with chest pain that radiates to his left neck and arm. First troponin positive at 0.86. He will begin IV heparin. Baseline labs wnl. Initial HL = 0.27   Goal of Therapy:  Heparin level 0.3-0.7 units/ml Monitor platelets by anticoagulation protocol: Yes   Plan:  Heparin to 1650 units / hr Follow up after cath  Thank you 01/15/16, PharmD [phone]  [phone] 01/13/2016,4:10 AM

## 2016-01-14 ENCOUNTER — Inpatient Hospital Stay (HOSPITAL_COMMUNITY): Payer: Medicare Other

## 2016-01-14 ENCOUNTER — Other Ambulatory Visit: Payer: Self-pay | Admitting: *Deleted

## 2016-01-14 ENCOUNTER — Encounter (HOSPITAL_COMMUNITY): Payer: Self-pay | Admitting: Interventional Cardiology

## 2016-01-14 DIAGNOSIS — I2510 Atherosclerotic heart disease of native coronary artery without angina pectoris: Secondary | ICD-10-CM

## 2016-01-14 LAB — CBC
HCT: 41.9 % (ref 39.0–52.0)
Hemoglobin: 14.1 g/dL (ref 13.0–17.0)
MCH: 30.5 pg (ref 26.0–34.0)
MCHC: 33.7 g/dL (ref 30.0–36.0)
MCV: 90.7 fL (ref 78.0–100.0)
Platelets: 167 10*3/uL (ref 150–400)
RBC: 4.62 MIL/uL (ref 4.22–5.81)
RDW: 13.2 % (ref 11.5–15.5)
WBC: 9.6 10*3/uL (ref 4.0–10.5)

## 2016-01-14 LAB — HEMOGLOBIN A1C
HEMOGLOBIN A1C: 8.2 % — AB (ref 4.8–5.6)
MEAN PLASMA GLUCOSE: 189 mg/dL

## 2016-01-14 LAB — TROPONIN I: Troponin I: 4.75 ng/mL (ref <0.031)

## 2016-01-14 LAB — HEPARIN LEVEL (UNFRACTIONATED)
HEPARIN UNFRACTIONATED: 0.27 [IU]/mL — AB (ref 0.30–0.70)
Heparin Unfractionated: 0.49 IU/mL (ref 0.30–0.70)

## 2016-01-14 LAB — GLUCOSE, CAPILLARY
GLUCOSE-CAPILLARY: 204 mg/dL — AB (ref 65–99)
GLUCOSE-CAPILLARY: 156 mg/dL — AB (ref 65–99)
Glucose-Capillary: 214 mg/dL — ABNORMAL HIGH (ref 65–99)
Glucose-Capillary: 182 mg/dL — ABNORMAL HIGH (ref 65–99)

## 2016-01-14 MED ORDER — LISINOPRIL 2.5 MG PO TABS
2.5000 mg | ORAL_TABLET | Freq: Every day | ORAL | Status: DC
  Administered 2016-01-14 – 2016-01-19 (×6): 2.5 mg via ORAL
  Filled 2016-01-14 (×7): qty 1

## 2016-01-14 MED ORDER — MUPIROCIN CALCIUM 2 % EX CREA
TOPICAL_CREAM | Freq: Two times a day (BID) | CUTANEOUS | Status: DC
  Administered 2016-01-14 – 2016-01-15 (×3): via TOPICAL
  Administered 2016-01-15 – 2016-01-16 (×2): 1 via TOPICAL
  Administered 2016-01-17 (×2): via TOPICAL
  Administered 2016-01-18 – 2016-01-19 (×3): 1 via TOPICAL
  Administered 2016-01-19: 22:00:00 via TOPICAL
  Administered 2016-01-21: 1 via TOPICAL
  Administered 2016-01-21 – 2016-01-24 (×4): via TOPICAL
  Administered 2016-01-24 – 2016-01-25 (×2): 1 via TOPICAL
  Administered 2016-01-25 – 2016-01-27 (×4): via TOPICAL
  Filled 2016-01-14 (×3): qty 15

## 2016-01-14 NOTE — Progress Notes (Signed)
ANTICOAGULATION CONSULT NOTE - Follow Up Consult  Pharmacy Consult for Heparin Indication: chest pain/ACS, severe CAD pending TCTS eval  Allergies  Allergen Reactions  . Sulfa Antibiotics Other (See Comments)    Childhood     Patient Measurements: Height: 6\' 3"  (190.5 cm) Weight: 253 lb 15.5 oz (115.2 kg) IBW/kg (Calculated) : 84.5 Heparin Dosing Weight: 108 kg  Vital Signs: Temp: 98.2 F (36.8 C) (01/27 1922) Temp Source: Oral (01/27 1922) BP: 126/76 mmHg (01/27 1922) Pulse Rate: 80 (01/27 1922)  Labs:  Recent Labs  01/12/16 1949 01/12/16 2142  01/13/16 0342 01/13/16 1019 01/13/16 1830 01/13/16 2357 01/14/16 0259 01/14/16 0830 01/14/16 1924  HGB 16.2  --   --  15.9  --   --   --  14.1  --   --   HCT 47.3  --   --  46.3  --   --   --  41.9  --   --   PLT 187  --   --  167  --   --   --  167  --   --   APTT  --  28  --   --   --   --   --   --   --   --   LABPROT  --  12.6  --   --   --   --   --   --   --   --   INR  --  0.92  --   --   --   --   --   --   --   --   HEPARINUNFRC  --   --   --  0.27*  --   --   --   --  0.27* 0.49  CREATININE 1.08  --   --  0.95  --   --   --   --   --   --   TROPONINI  --   --   < > 5.95* 7.78* 7.21* 4.75*  --   --   --   < > = values in this interval not displayed.  Estimated Creatinine Clearance: 127.4 mL/min (by C-G formula based on Cr of 0.95).   Medications:  Scheduled:  . aspirin  81 mg Oral Daily  . atorvastatin  80 mg Oral q1800  . insulin aspart  0-15 Units Subcutaneous TID WC  . insulin aspart  0-5 Units Subcutaneous QHS  . lisinopril  2.5 mg Oral Daily  . metoprolol tartrate  25 mg Oral BID  . mupirocin cream   Topical BID  . predniSONE  5 mg Oral QHS  . traZODone  100 mg Oral QHS   Infusions:  . heparin 1,850 Units/hr (01/14/16 1553)  . nitroGLYCERIN 10 mcg/min (01/14/16 0400)    Assessment: 51 yo M s/p cath 1/26 showing severe CAD pending TCTS eval.   Heparin level is at goal on 1850 units/hr.    Goal of Therapy:  Heparin level 0.3-0.7 units/ml Monitor platelets by anticoagulation protocol: Yes   Plan:  Continue heparin at 1850 units/hr Daily heparin level and CBC while on heparin Follow up TCTS recommendations  2/26, PharmD, BCPS Clinical Pharmacist [phone]  01/14/2016 8:06 PM

## 2016-01-14 NOTE — Progress Notes (Signed)
Patient Name: Shamon Cothran Date of Encounter: 01/14/2016   SUBJECTIVE  No chest pain or dyspnea  CURRENT MEDS . aspirin  81 mg Oral Daily  . atorvastatin  80 mg Oral q1800  . insulin aspart  0-15 Units Subcutaneous TID WC  . insulin aspart  0-5 Units Subcutaneous QHS  . metoprolol tartrate  25 mg Oral BID  . predniSONE  5 mg Oral QHS  . traZODone  100 mg Oral QHS    OBJECTIVE  Filed Vitals:   01/13/16 2000 01/14/16 0329 01/14/16 0742 01/14/16 0800  BP: 123/69 104/50 119/65 112/55  Pulse: 78 83 62 47  Temp: 98 F (36.7 C) 98 F (36.7 C) 97.9 F (36.6 C)   TempSrc: Oral Oral Oral   Resp: 13 16 16 11   Height:      Weight:  253 lb 15.5 oz (115.2 kg)    SpO2: 100% 96% 96% 96%    Intake/Output Summary (Last 24 hours) at 01/14/16 0933 Last data filed at 01/14/16 0717  Gross per 24 hour  Intake 3577.84 ml  Output    450 ml  Net 3127.84 ml   Filed Weights   01/12/16 2201 01/13/16 0349 01/14/16 0329  Weight: 254 lb 13.6 oz (115.6 kg) 254 lb 13.6 oz (115.6 kg) 253 lb 15.5 oz (115.2 kg)    PHYSICAL EXAM  General: Pleasant, NAD. Neuro: Alert and oriented X 3. Moves all extremities spontaneously. Psych: Normal affect. HEENT:  Normal  Neck: Supple  Lungs:  CTA. Heart: RRR  Abdomen: Soft, non-tender, non-distended Extremities: No edema. Radial cath site with no hematoma Accessory Clinical Findings  CBC  Recent Labs  01/13/16 0342 01/14/16 0259  WBC 10.9* 9.6  HGB 15.9 14.1  HCT 46.3 41.9  MCV 88.7 90.7  PLT 167 167   Basic Metabolic Panel  Recent Labs  01/12/16 1949 01/13/16 0342 01/13/16 1019  NA 136 138  --   K 4.6 4.7  --   CL 98* 103  --   CO2 25 25  --   GLUCOSE 361* 254*  --   BUN 17 14  --   CREATININE 1.08 0.95  --   CALCIUM 9.8 9.3  --   MG  --   --  2.1   Cardiac Enzymes  Recent Labs  01/13/16 1019 01/13/16 1830 01/13/16 2357  TROPONINI 7.78* 7.21* 4.75*  Fasting Lipid Panel  Recent Labs  01/13/16 0342  CHOL 188    HDL 34*  LDLCALC 89  TRIG 01/15/16*  CHOLHDL 5.5   Thyroid Function Tests  Recent Labs  01/12/16 2305  TSH 2.137    TELE  Sinus rhythm with frequent PVCs  Radiology/Studies  Dg Chest Port 1 View  01/12/2016  CLINICAL DATA:  Chest pain radiating into left arm for several weeks. Shortness of breath. EXAM: PORTABLE CHEST 1 VIEW COMPARISON:  May 18, 2015 FINDINGS: Lungs are clear. Heart size and pulmonary vascularity are normal. No adenopathy. No bone lesions. No pneumothorax. IMPRESSION: No edema or consolidation. Electronically Signed   By: May 20, 2015 III M.D.   On: 01/12/2016 20:48    ASSESSMENT AND PLAN   1. NSTEMI (non-ST elevated myocardial infarction) (HCC) - Cath reveals severe CAD; CVTS consult pending for CABG. - Continue ASA 81, lipitor 80mg , IV heparin, IV nitro, and metoprolol 12.5mg  BID. Brilinta started by fellow on admission has been DCed.  2. HL - continue statin  3. Ventricular ectopy - continue metoprolol  4. DM - On SSI.  5. Chronic Prednisone for questionable RA  6. Prior history of illicit drug use: Quit 10 years ago. UDS negative  7. Tobacco abuse - Counseled on DCing  8. ICM - LV function reduced on cath but by report normal on echo; will review. Add low dose ACEI.  Signed, Milus Height

## 2016-01-14 NOTE — Care Management Important Message (Signed)
Important Message  Patient Details  Name: Austin Arnold MRN: [id number] Date of Birth: 1966/03/13   Medicare Important Message Given:  Yes    Austin Arnold Austin Arnold 01/14/2016, 4:29 PM

## 2016-01-14 NOTE — Progress Notes (Signed)
TR BAND REMOVAL  LOCATION:    right radial  DEFLATED PER PROTOCOL:    Yes.    TIME BAND OFF / DRESSING APPLIED:    22:00   SITE UPON ARRIVAL:    Level 0  SITE AFTER BAND REMOVAL:    Level 0  CIRCULATION SENSATION AND MOVEMENT:    Within Normal Limits   Yes.    COMMENTS:    

## 2016-01-14 NOTE — Progress Notes (Addendum)
Inpatient Diabetes Program Recommendations  AACE/ADA: New Consensus Statement on Inpatient Glycemic Control (2015)  Target Ranges:  Prepandial:   less than 140 mg/dL      Peak postprandial:   less than 180 mg/dL (1-2 hours)      Critically ill patients:  140 - 180 mg/dL   Results for Austin Arnold, Austin Arnold (MRN [id number]) as of 01/14/2016 10:08  Ref. Range 01/13/2016 07:11 01/13/2016 13:36 01/13/2016 19:57  Glucose-Capillary Latest Ref Range: 65-99 mg/dL 716 (H) 967 (H) 893 (H)   Results for Austin Arnold, Austin Arnold (MRN [id number]) as of 01/14/2016 10:08  Ref. Range 01/14/2016 06:02  Glucose-Capillary Latest Ref Range: 65-99 mg/dL 585 (H)   Admit with: CP  History: DM  Home DM Meds: Toujeo basal insulin 48 units daily  Metformin 1000 mg bid  Current Insulin Orders: Novolog Moderate SSI (0-15 units) TID AC + HS     MD- Please consider staring at least 50% of patient's home dose of basal insulin.  Recommend Lantus 24 units daily (please start today)     --Will follow patient during hospitalization--  Ambrose Finland RN, MSN, CDE Diabetes Coordinator Inpatient Glycemic Control Team Team Pager: [phone] (8a-5p)

## 2016-01-14 NOTE — Progress Notes (Signed)
CARDIAC REHAB PHASE I   Pt off unit in testing. Will follow-up tomorrow for pre-op education.  Joylene Grapes, RN, BSN 01/14/2016 2:52 PM

## 2016-01-14 NOTE — Progress Notes (Signed)
VASCULAR LAB PRELIMINARY  PRELIMINARY  PRELIMINARY  PRELIMINARY  Pre-op Cardiac Surgery  Carotid Findings:  Bilateral:  1-39% ICA stenosis.  Vertebral artery flow is antegrade.      Upper Extremity Right Left  Brachial Pressures 118  T 109  T  Radial Waveforms T T  Ulnar Waveforms T T  Palmar Arch (Allen's Test) Doppler normal with radial compression, obliterates with ulnar compression. Doppler obliterates with radial compression, normal with ulnar compression.    Lower  Extremity Right Left  Dorsalis Pedis 148  T 140  T  Anterior Tibial    Posterior Tibial 144  T 150  T  Ankle/Brachial Indices 1.25 1.27    Austin Arnold, RVT 01/14/2016, 3:42 PM

## 2016-01-14 NOTE — Consult Note (Addendum)
WOC wound consult note Reason for Consult: Consult requested for right great toe wound.  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 He has been using Bactroban ointment and was getting his callous trimmed periodically.  He is very well-informed regarding topical treatment. Pt states "wound appearance is usually red but he had it uncovered and was wearing socks and it became dirty recently." Wound type: Right plantar great toe with full thickness wound Measurement: .5X.5X.2cm Wound bed: dark brown dry wound bed Drainage (amount, consistency, odor) Scant amt tan drainage, no odor  Periwound: Intact skin surrounding Dressing procedure/placement/frequency: Continue present plan of care with Bactroban to provide antibacterial benefits and promote moist healing.  Encouraged pt to find a new podiatrist in this area after discharge. Please re-consult if further assistance is needed.  Thank-you,  Cammie Mcgee MSN, RN, CWOCN, [place], CNS [phone]

## 2016-01-14 NOTE — Progress Notes (Signed)
ANTICOAGULATION CONSULT NOTE - Follow Up Consult  Pharmacy Consult for Heparin Indication: chest pain/ACS, severe CAD pending TCTS eval  Allergies  Allergen Reactions  . Sulfa Antibiotics Other (See Comments)    Childhood     Patient Measurements: Height: 6\' 3"  (190.5 cm) Weight: 253 lb 15.5 oz (115.2 kg) IBW/kg (Calculated) : 84.5 Heparin Dosing Weight: 108 kg  Vital Signs: Temp: 97.9 F (36.6 C) (01/27 0742) Temp Source: Oral (01/27 0742) BP: 112/55 mmHg (01/27 0800) Pulse Rate: 47 (01/27 0800)  Labs:  Recent Labs  01/12/16 1949 01/12/16 2142  01/13/16 0342 01/13/16 1019 01/13/16 1830 01/13/16 2357 01/14/16 0259 01/14/16 0830  HGB 16.2  --   --  15.9  --   --   --  14.1  --   HCT 47.3  --   --  46.3  --   --   --  41.9  --   PLT 187  --   --  167  --   --   --  167  --   APTT  --  28  --   --   --   --   --   --   --   LABPROT  --  12.6  --   --   --   --   --   --   --   INR  --  0.92  --   --   --   --   --   --   --   HEPARINUNFRC  --   --   --  0.27*  --   --   --   --  0.27*  CREATININE 1.08  --   --  0.95  --   --   --   --   --   TROPONINI  --   --   < > 5.95* 7.78* 7.21* 4.75*  --   --   < > = values in this interval not displayed.  Estimated Creatinine Clearance: 127.4 mL/min (by C-G formula based on Cr of 0.95).   Medications:  Scheduled:  . aspirin  81 mg Oral Daily  . atorvastatin  80 mg Oral q1800  . insulin aspart  0-15 Units Subcutaneous TID WC  . insulin aspart  0-5 Units Subcutaneous QHS  . lisinopril  2.5 mg Oral Daily  . metoprolol tartrate  25 mg Oral BID  . mupirocin cream   Topical BID  . predniSONE  5 mg Oral QHS  . traZODone  100 mg Oral QHS   Infusions:  . heparin 1,650 Units/hr (01/14/16 0400)  . nitroGLYCERIN 10 mcg/min (01/14/16 0400)    Assessment: 50 yo M s/p cath 1/26 showing severe CAD pending TCTS eval.  Heparin level is slightly < goal on 1650 units/hr.  No bleeding noted.  Goal of Therapy:  Heparin level  0.3-0.7 units/ml Monitor platelets by anticoagulation protocol: Yes   Plan:  Increase heparin to 1850 units/hr Heparin level at 1800 Daily heparin level and CBC while on heparin Follow up TCTS recommendations  2/26, Pharm.D., BCPS Clinical Pharmacist Pager [phone] 01/14/2016 11:29 AM

## 2016-01-15 DIAGNOSIS — E118 Type 2 diabetes mellitus with unspecified complications: Secondary | ICD-10-CM

## 2016-01-15 DIAGNOSIS — Z794 Long term (current) use of insulin: Secondary | ICD-10-CM

## 2016-01-15 DIAGNOSIS — E1165 Type 2 diabetes mellitus with hyperglycemia: Secondary | ICD-10-CM

## 2016-01-15 DIAGNOSIS — I25110 Atherosclerotic heart disease of native coronary artery with unstable angina pectoris: Secondary | ICD-10-CM

## 2016-01-15 LAB — HEPARIN LEVEL (UNFRACTIONATED): HEPARIN UNFRACTIONATED: 0.4 [IU]/mL (ref 0.30–0.70)

## 2016-01-15 LAB — BASIC METABOLIC PANEL
ANION GAP: 5 (ref 5–15)
BUN: 16 mg/dL (ref 6–20)
CALCIUM: 9 mg/dL (ref 8.9–10.3)
CO2: 24 mmol/L (ref 22–32)
Chloride: 107 mmol/L (ref 101–111)
Creatinine, Ser: 1.01 mg/dL (ref 0.61–1.24)
GFR calc Af Amer: >60 mL/min (ref >60)
GLUCOSE: 265 mg/dL — AB (ref 65–99)
Potassium: 4.9 mmol/L (ref 3.5–5.1)
Sodium: 136 mmol/L (ref 135–145)

## 2016-01-15 LAB — GLUCOSE, CAPILLARY
GLUCOSE-CAPILLARY: 191 mg/dL — AB (ref 65–99)
GLUCOSE-CAPILLARY: 175 mg/dL — AB (ref 65–99)
Glucose-Capillary: 203 mg/dL — ABNORMAL HIGH (ref 65–99)
Glucose-Capillary: 176 mg/dL — ABNORMAL HIGH (ref 65–99)

## 2016-01-15 LAB — CBC
HCT: 41.5 % (ref 39.0–52.0)
Hemoglobin: 13.9 g/dL (ref 13.0–17.0)
MCH: 30 pg (ref 26.0–34.0)
MCHC: 33.5 g/dL (ref 30.0–36.0)
MCV: 89.6 fL (ref 78.0–100.0)
PLATELETS: 151 10*3/uL (ref 150–400)
RBC: 4.63 MIL/uL (ref 4.22–5.81)
RDW: 13.1 % (ref 11.5–15.5)
WBC: 9.3 10*3/uL (ref 4.0–10.5)

## 2016-01-15 LAB — URINALYSIS, ROUTINE W REFLEX MICROSCOPIC
Bilirubin Urine: NEGATIVE
Glucose, UA: >1000 mg/dL — AB
Ketones, ur: NEGATIVE mg/dL
Leukocytes, UA: NEGATIVE
Nitrite: NEGATIVE
Protein, ur: NEGATIVE mg/dL
Specific Gravity, Urine: 1.017 (ref 1.005–1.030)
pH: 5.5 (ref 5.0–8.0)

## 2016-01-15 LAB — URINE MICROSCOPIC-ADD ON

## 2016-01-15 MED ORDER — INSULIN GLARGINE 100 UNIT/ML ~~LOC~~ SOLN
20.0000 [IU] | Freq: Every day | SUBCUTANEOUS | Status: DC
  Administered 2016-01-15: 10:00:00 20 [IU] via SUBCUTANEOUS
  Filled 2016-01-15: qty 0.2

## 2016-01-15 MED ORDER — INSULIN GLARGINE 100 UNIT/ML ~~LOC~~ SOLN
20.0000 [IU] | Freq: Two times a day (BID) | SUBCUTANEOUS | Status: DC
  Administered 2016-01-15 – 2016-01-19 (×8): 20 [IU] via SUBCUTANEOUS
  Filled 2016-01-15 (×11): qty 0.2

## 2016-01-15 MED ORDER — MOVING RIGHT ALONG BOOK
Freq: Once | Status: AC
  Administered 2016-01-15: 10:00:00
  Filled 2016-01-15: qty 1

## 2016-01-15 NOTE — Progress Notes (Signed)
U/A obtained; surgical PCR obtained 01/12/16 prior previous procedure - second request PCR screen d/c'ed.
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
## 2016-01-15 NOTE — Progress Notes (Signed)
CARDIAC REHAB PHASE I   PRE:  Rate/Rhythm: SR with pvc 74  BP:  Supine:   Sitting: 104/62  Standing:    SaO2: 97 RA  MODE:  Ambulation: 550 ft   POST:  Rate/Rhythm: 82   BP:  Supine:   Sitting: 108/58  Standing:    SaO2: 97 RA  Pt scheduled for heart surgery next week.  Pre-op teaching completed with pt and family at the bedside.  Pt given heart surgery booklet on yesterday.  Reviewed with pt what to to anticipate, exercise/ambulation.   Pt to watch the what to expect for heart surgery and recovery.  Pt given the phone number and video number to dial. Pt demonstrated appropriately how to get up from the bed without using his arms.  Pt asked about incentive spirometer. Advised that someone from RT would be.  Questions answered.  Ambulation without any assistance or complaints of chest discomfort.  Encouraged pt to ambulate as tolerated. Alanson Aly, BSN

## 2016-01-15 NOTE — Progress Notes (Signed)
ANTICOAGULATION CONSULT NOTE - Follow Up Consult  Pharmacy Consult for Heparin Indication: chest pain/ACS, severe CAD pending TCTS eval  Allergies  Allergen Reactions  . Sulfa Antibiotics Other (See Comments)    Childhood     Patient Measurements: Height: 6\' 3"  (190.5 cm) Weight: 254 lb 10.1 oz (115.5 kg) IBW/kg (Calculated) : 84.5 Heparin Dosing Weight: 108 kg  Vital Signs: Temp: 97.6 F (36.4 C) (01/28 0300) Temp Source: Oral (01/28 0300) BP: 111/65 mmHg (01/28 0300)  Labs:  Recent Labs  01/12/16 1949 01/12/16 2142  01/13/16 0342 01/13/16 1019 01/13/16 1830 01/13/16 2357 01/14/16 0259 01/14/16 0830 01/14/16 1924 01/15/16 0253  HGB 16.2  --   --  15.9  --   --   --  14.1  --   --  13.9  HCT 47.3  --   --  46.3  --   --   --  41.9  --   --  41.5  PLT 187  --   --  167  --   --   --  167  --   --  151  APTT  --  28  --   --   --   --   --   --   --   --   --   LABPROT  --  12.6  --   --   --   --   --   --   --   --   --   INR  --  0.92  --   --   --   --   --   --   --   --   --   HEPARINUNFRC  --   --   < > 0.27*  --   --   --   --  0.27* 0.49 0.40  CREATININE 1.08  --   --  0.95  --   --   --   --   --   --  1.01  TROPONINI  --   --   < > 5.95* 7.78* 7.21* 4.75*  --   --   --   --   < > = values in this interval not displayed.  Estimated Creatinine Clearance: 119.9 mL/min (by C-G formula based on Cr of 1.01).   Medications:  Scheduled:  . aspirin  81 mg Oral Daily  . atorvastatin  80 mg Oral q1800  . insulin aspart  0-15 Units Subcutaneous TID WC  . insulin aspart  0-5 Units Subcutaneous QHS  . insulin glargine  20 Units Subcutaneous Daily  . lisinopril  2.5 mg Oral Daily  . metoprolol tartrate  25 mg Oral BID  . mupirocin cream   Topical BID  . predniSONE  5 mg Oral QHS  . traZODone  100 mg Oral QHS   Infusions:  . heparin 1,850 Units/hr (01/15/16 0700)  . nitroGLYCERIN 10 mcg/min (01/15/16 0700)    Assessment: 50 yo M s/p cath 1/26 showing  severe CAD pending CABG 1/31 .   Heparin level (0.4) is at goal on 1850 units/hr. Hgb 13.9, plt 151K, stable.  Goal of Therapy:  Heparin level 0.3-0.7 units/ml Monitor platelets by anticoagulation protocol: Yes   Plan:  Continue heparin at 1850 units/hr Daily heparin level and CBC while on heparin Follow up TCTS recommendations  2/31, PharmD, BCPS  Clinical Pharmacist  Pager: [phone]   01/15/2016 8:31 AM

## 2016-01-15 NOTE — Progress Notes (Addendum)
Subjective: No CP, PND, orthopnea or SOB  Objective: Vital signs in last 24 hours: Temp:  [97.6 F (36.4 C)-98.2 F (36.8 C)] 97.6 F (36.4 C) (01/28 0300) Pulse Rate:  [47-80] 75 (01/27 2000) Resp:  [11-25] 15 (01/28 0300) BP: (111-126)/(55-76) 111/65 mmHg (01/28 0300) SpO2:  [96 %-99 %] 99 % (01/28 0300) Weight:  [254 lb 10.1 oz (115.5 kg)] 254 lb 10.1 oz (115.5 kg) (01/28 0300) Last BM Date: 01/14/16  Intake/Output from previous day: 01/27 0701 - 01/28 0700 In: 1524.8 [P.O.:960; I.V.:564.8] Out: -  Intake/Output this shift:    Medications Scheduled Meds: . aspirin  81 mg Oral Daily  . atorvastatin  80 mg Oral q1800  . insulin aspart  0-15 Units Subcutaneous TID WC  . insulin aspart  0-5 Units Subcutaneous QHS  . lisinopril  2.5 mg Oral Daily  . metoprolol tartrate  25 mg Oral BID  . mupirocin cream   Topical BID  . predniSONE  5 mg Oral QHS  . traZODone  100 mg Oral QHS   Continuous Infusions: . heparin 1,850 Units/hr (01/15/16 0700)  . nitroGLYCERIN 10 mcg/min (01/15/16 0700)   PRN Meds:.acetaminophen, alum & mag hydroxide-simeth, nitroGLYCERIN, ondansetron (ZOFRAN) IV  PE: General appearance: alert, cooperative and no distress Lungs: clear to auscultation bilaterally Heart: regular rate and rhythm Extremities: No LEE Pulses: 2+ and symmetric Skin: Warm and dry, radial cath site stable.  No ecchymosis. Neurologic: Grossly normal  Lab Results:   Recent Labs  01/13/16 0342 01/14/16 0259 01/15/16 0253  WBC 10.9* 9.6 9.3  HGB 15.9 14.1 13.9  HCT 46.3 41.9 41.5  PLT 167 167 151   BMET  Recent Labs  01/12/16 1949 01/13/16 0342 01/15/16 0253  NA 136 138 136  K 4.6 4.7 4.9  CL 98* 103 107  CO2 25 25 24   GLUCOSE 361* 254* 265*  BUN 17 14 16   CREATININE 1.08 0.95 1.01  CALCIUM 9.8 9.3 9.0   PT/INR  Recent Labs  01/12/16 2142  LABPROT 12.6  INR 0.92   Cholesterol  Recent Labs  01/13/16 0342  CHOL 188   Lipid Panel       Component Value Date/Time   CHOL 188 01/13/2016 0342   TRIG 326* 01/13/2016 0342   HDL 34* 01/13/2016 0342   CHOLHDL 5.5 01/13/2016 0342   VLDL 65* 01/13/2016 0342   LDLCALC 89 01/13/2016 0342       Assessment/Plan  1. NSTEMI (non-ST elevated myocardial infarction) [facility]) - Cath reveals severe CAD;  CABG on Tuesday.  - Continue ASA 81, lipitor 80mg , IV heparin, IV nitro, and metoprolol 12.5mg  BID. Lisinopril 2.5.  Brilinta started by fellow on admission has been DCed. BP stable and controlled.  2. HL - continue statin  3. Ventricular ectopy - continue metoprolol  4. DM - On SSI. A1C 8.2.  On Glargine and metformin at home.  Start 20 units lantus daily today and titrate.  Seen by DM coor already.  5. Chronic Prednisone for questionable RA  6. Prior history of illicit drug use: Quit 10 years ago. UDS negative  7. Tobacco abuse - Counseled on DCing  8. ICM - LV function reduced on cath but by report, normal on echo(frequent ectopy made LVEF assessment difficult);  G2DD     LOS: 3 days    HAGER, BRYAN PA-C 01/15/2016 7:23 AM  Patient seen and examined with Monday, PA-C. We discussed all aspects of the encounter. I agree with the assessment and  plan as stated above. He is stable. Plan for cath on Tuesday. Continue current regimen with IV heparin and NTG. Brilinta is off. Keep on tele. Lantus adjusted.   Bensimhon, Daniel,MD 9:14 AM
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
## 2016-01-15 NOTE — Progress Notes (Addendum)
Patient has expressed concerns about the need for narcotic pain medication post surgery.  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  He has been in regular contact with his sponsor during this hospitalization.   I have offered counsel and advice to him on this topic both Thursday and today.  I have also arranged for him to speak to a pharmacist for further advice and counsel.  Patient states that pharmacist suggested Tramadol and methadone might be one approach, and reassured him that pain medication usage could be minimized and that he will help drive decisions to use.  I discussed with him the pros of adequate pain control, e.g., mobility, coughing/deep breathing.  The patient is highly motivated and needs support from all health care providers in this regard. He has agreed to a spiritual care consult and I have order this as well.  He enjoys a strong marriage and is strongly supported by his wife.

## 2016-01-16 LAB — CBC
HCT: 41.3 % (ref 39.0–52.0)
Hemoglobin: 13.8 g/dL (ref 13.0–17.0)
MCH: 29.9 pg (ref 26.0–34.0)
MCHC: 33.4 g/dL (ref 30.0–36.0)
MCV: 89.6 fL (ref 78.0–100.0)
PLATELETS: 159 10*3/uL (ref 150–400)
RBC: 4.61 MIL/uL (ref 4.22–5.81)
RDW: 13.3 % (ref 11.5–15.5)
WBC: 8.5 10*3/uL (ref 4.0–10.5)

## 2016-01-16 LAB — GLUCOSE, CAPILLARY
GLUCOSE-CAPILLARY: 241 mg/dL — AB (ref 65–99)
GLUCOSE-CAPILLARY: 152 mg/dL — AB (ref 65–99)
Glucose-Capillary: 150 mg/dL — ABNORMAL HIGH (ref 65–99)
Glucose-Capillary: 164 mg/dL — ABNORMAL HIGH (ref 65–99)

## 2016-01-16 LAB — TSH: TSH: 1.299 u[IU]/mL (ref 0.350–4.500)

## 2016-01-16 LAB — HEPARIN LEVEL (UNFRACTIONATED): Heparin Unfractionated: 0.4 IU/mL (ref 0.30–0.70)

## 2016-01-16 NOTE — Progress Notes (Signed)
ANTICOAGULATION CONSULT NOTE - Follow Up Consult  Pharmacy Consult for Heparin Indication: severe CAD awaiting CABG  Allergies  Allergen Reactions  . Sulfa Antibiotics Other (See Comments)    Childhood     Patient Measurements: Height: 6\' 3"  (190.5 cm) Weight: 253 lb 9.6 oz (115.032 kg) IBW/kg (Calculated) : 84.5 Heparin Dosing Weight: 108kg  Vital Signs: Temp: 97.3 F (36.3 C) (01/29 0538) Temp Source: Oral (01/29 0538) BP: 107/59 mmHg (01/29 0538) Pulse Rate: 69 (01/29 0538)  Labs:  Recent Labs  01/13/16 1830 01/13/16 2357  01/14/16 0259  01/14/16 1924 01/15/16 0253 01/16/16 0052 01/16/16 0503  HGB  --   --   < > 14.1  --   --  13.9 13.8  --   HCT  --   --   --  41.9  --   --  41.5 41.3  --   PLT  --   --   --  167  --   --  151 159  --   HEPARINUNFRC  --   --   --   --   < > 0.49 0.40  --  0.40  CREATININE  --   --   --   --   --   --  1.01  --   --   TROPONINI 7.21* 4.75*  --   --   --   --   --   --   --   < > = values in this interval not displayed.  Estimated Creatinine Clearance: 119.7 mL/min (by C-G formula based on Cr of 1.01).   Medications:  Heparin @ 1850 units/hr  Assessment: 49yom continues on heparin for his severe CAD awaiting CABG 1/31. Heparin level is therapeutic at 0.40. CBC stable. No bleeding issues.  Goal of Therapy:  Heparin level 0.3-0.7 units/ml Monitor platelets by anticoagulation protocol: Yes   Plan:  1) Continue heparin at 1850 units/hr 2) Daily heparin level and CBC  2/31 01/16/2016,11:50 AM

## 2016-01-16 NOTE — Progress Notes (Signed)
    Subjective: He states that he is chest pain free, no SOB, walking around the floor.  Objective: Vital signs in last 24 hours: Temp:  [97.3 F (36.3 C)-98.4 F (36.9 C)] 97.3 F (36.3 C) (01/29 0538) Pulse Rate:  [64-69] 69 (01/29 0538) Resp:  [16-18] 16 (01/29 0538) BP: (99-107)/(59-65) 107/59 mmHg (01/29 0538) SpO2:  [97 %-100 %] 99 % (01/29 0538) Weight:  [253 lb 9.6 oz (115.032 kg)] 253 lb 9.6 oz (115.032 kg) (01/29 0538) Last BM Date: 01/15/16  Intake/Output from previous day: 01/28 0701 - 01/29 0700 In: 840 [P.O.:840] Out: 1351 [Urine:1350; Stool:1] Intake/Output this shift:    Medications Scheduled Meds: . aspirin  81 mg Oral Daily  . atorvastatin  80 mg Oral q1800  . insulin aspart  0-15 Units Subcutaneous TID WC  . insulin aspart  0-5 Units Subcutaneous QHS  . insulin glargine  20 Units Subcutaneous BID  . lisinopril  2.5 mg Oral Daily  . metoprolol tartrate  25 mg Oral BID  . mupirocin cream   Topical BID  . predniSONE  5 mg Oral QHS  . traZODone  100 mg Oral QHS   Continuous Infusions: . heparin 1,850 Units/hr (01/16/16 0945)  . nitroGLYCERIN 10 mcg/min (01/15/16 0700)   PRN Meds:.acetaminophen, alum & mag hydroxide-simeth, nitroGLYCERIN, ondansetron (ZOFRAN) IV  PE: General appearance: alert, cooperative and no distress Lungs: clear to auscultation bilaterally Heart: regular rate and rhythm Extremities: No LEE Pulses: 2+ and symmetric Skin: Warm and dry, radial cath site stable.  No ecchymosis. Neurologic: Grossly normal  Lab Results:   Recent Labs  01/14/16 0259 01/15/16 0253 01/16/16 0052  WBC 9.6 9.3 8.5  HGB 14.1 13.9 13.8  HCT 41.9 41.5 41.3  PLT 167 151 159   BMET  Recent Labs  01/15/16 0253  NA 136  K 4.9  CL 107  CO2 24  GLUCOSE 265*  BUN 16  CREATININE 1.01  CALCIUM 9.0   PT/INR No results for input(s): LABPROT, INR in the last 72 hours. Cholesterol No results for input(s): CHOL in the last 72 hours. Lipid  Panel     Component Value Date/Time   CHOL 188 01/13/2016 0342   TRIG 326* 01/13/2016 0342   HDL 34* 01/13/2016 0342   CHOLHDL 5.5 01/13/2016 0342   VLDL 65* 01/13/2016 0342   LDLCALC 89 01/13/2016 0342       Assessment/Plan  1. NSTEMI (non-ST elevated myocardial infarction) [facility]) - Cath reveals severe CAD;  CABG on Tuesday.  - Continue ASA 81, lipitor 80mg , IV heparin, IV nitro, and metoprolol 12.5mg  BID. Lisinopril 2.5.  Brilinta started by fellow on admission has been DCed. BP stable and controlled.  2. HL - continue statin  3. Ventricular ectopy - continue metoprolol  4. DM - On SSI. A1C 8.2.  On Glargine and metformin at home.  Start 20 units lantus daily today and titrate.  Seen by DM coor already.  5. Chronic Prednisone for questionable RA  6. Prior history of illicit drug use: Quit 10 years ago. UDS negative  7. Tobacco abuse - Counseled on DCing  8. ICM - LV function reduced on cath but by report, normal on echo(frequent ectopy made LVEF assessment difficult);  G2DD   H,MD 10:29 AM

## 2016-01-17 ENCOUNTER — Inpatient Hospital Stay (HOSPITAL_COMMUNITY): Payer: Medicare Other

## 2016-01-17 DIAGNOSIS — R7989 Other specified abnormal findings of blood chemistry: Secondary | ICD-10-CM

## 2016-01-17 LAB — GLUCOSE, CAPILLARY
GLUCOSE-CAPILLARY: 182 mg/dL — AB (ref 65–99)
Glucose-Capillary: 186 mg/dL — ABNORMAL HIGH (ref 65–99)
Glucose-Capillary: 190 mg/dL — ABNORMAL HIGH (ref 65–99)
Glucose-Capillary: 332 mg/dL — ABNORMAL HIGH (ref 65–99)

## 2016-01-17 LAB — PULMONARY FUNCTION TEST
DL/VA % pred: 79 %
DL/VA: 3.81 ml/min/mmHg/L
DLCO unc % pred: 87 %
DLCO unc: 31.65 ml/min/mmHg
FEF 25-75 Post: 3.7 L/sec
FEF 25-75 Pre: 3.48 L/sec
FEF2575-%Change-Post: 6 %
FEF2575-%Pred-Post: 98 %
FEF2575-%Pred-Pre: 92 %
FEV1-%Change-Post: -1 %
FEV1-%Pred-Post: 96 %
FEV1-%Pred-Pre: 97 %
FEV1-Post: 4.17 L
FEV1-Pre: 4.22 L
FEV1FVC-%Change-Post: 0 %
FEV1FVC-%Pred-Pre: 96 %
FEV6-%Change-Post: 0 %
FEV6-%Pred-Post: 102 %
FEV6-%Pred-Pre: 103 %
FEV6-Post: 5.53 L
FEV6-Pre: 5.55 L
FEV6FVC-%Change-Post: 0 %
FEV6FVC-%Pred-Post: 102 %
FEV6FVC-%Pred-Pre: 102 %
FVC-%Change-Post: 0 %
FVC-%Pred-Post: 99 %
FVC-%Pred-Pre: 100 %
FVC-Post: 5.56 L
FVC-Pre: 5.59 L
Post FEV1/FVC ratio: 75 %
Post FEV6/FVC ratio: 99 %
Pre FEV1/FVC ratio: 75 %
Pre FEV6/FVC Ratio: 99 %
RV % pred: 113 %
RV: 2.49 L
TLC % pred: 122 %
TLC: 9.28 L

## 2016-01-17 LAB — CBC
HEMATOCRIT: 41.4 % (ref 39.0–52.0)
Hemoglobin: 13.8 g/dL (ref 13.0–17.0)
MCH: 29.9 pg (ref 26.0–34.0)
MCHC: 33.3 g/dL (ref 30.0–36.0)
MCV: 89.8 fL (ref 78.0–100.0)
Platelets: 162 10*3/uL (ref 150–400)
RBC: 4.61 MIL/uL (ref 4.22–5.81)
RDW: 13 % (ref 11.5–15.5)
WBC: 8.9 10*3/uL (ref 4.0–10.5)

## 2016-01-17 LAB — HEPARIN LEVEL (UNFRACTIONATED): Heparin Unfractionated: 0.3 IU/mL (ref 0.30–0.70)

## 2016-01-17 MED ORDER — ALBUTEROL SULFATE (2.5 MG/3ML) 0.083% IN NEBU
2.5000 mg | INHALATION_SOLUTION | Freq: Once | RESPIRATORY_TRACT | Status: AC
  Administered 2016-01-17: 2.5 mg via RESPIRATORY_TRACT

## 2016-01-17 NOTE — Progress Notes (Signed)
Chaplain presented to the patient's room to provide spiritual care support. Patient shared that he is scheduled for heart surgery on Thursday. 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 Chaplain offered prayer of healing and wholeness for the patient. Chaplain Janell Quiet [phone]

## 2016-01-17 NOTE — Progress Notes (Signed)
ANTICOAGULATION CONSULT NOTE - Follow Up Consult  Pharmacy Consult for Heparin Indication: severe CAD awaiting CABG  Allergies  Allergen Reactions  . Sulfa Antibiotics Other (See Comments)    Childhood     Patient Measurements: Height: 6\' 3"  (190.5 cm) Weight: 253 lb 1.6 oz (114.805 kg) IBW/kg (Calculated) : 84.5 Heparin Dosing Weight: 108kg  Vital Signs: Temp: 98.1 F (36.7 C) (01/30 0506) Temp Source: Oral (01/30 0506) BP: 101/59 mmHg (01/30 0506) Pulse Rate: 79 (01/30 0506)  Labs:  Recent Labs  01/15/16 0253 01/16/16 0052 01/16/16 0503 01/17/16 0406  HGB 13.9 13.8  --  13.8  HCT 41.5 41.3  --  41.4  PLT 151 159  --  162  HEPARINUNFRC 0.40  --  0.40 0.30  CREATININE 1.01  --   --   --     Estimated Creatinine Clearance: 119.6 mL/min (by C-G formula based on Cr of 1.01).    Assessment:  Anticoagulation: NSTEMI, restarted after cath for severe CAD, CABG 2/2 (awaiting Brilinta washout). HL 0.3, CBC WNL.  Goal of Therapy:  Heparin level 0.3-0.7 units/ml Monitor platelets by anticoagulation protocol: Yes   Plan:  Increase heparin slightly to 1900 units/hr to prevent drop Daily heparin level and CBC CABG 2/2   Kelise Kuch S. 01/19/16, PharmD, [facility] Clinical Staff Pharmacist Pager [phone]  [phone] [place] 01/17/2016,9:47 AM

## 2016-01-17 NOTE — Care Management Note (Signed)
Case Management Note Donn Pierini RN, BSN Unit 2W-Case Manager [phone]  Patient Details  Name: Austin Arnold MRN: [id number] Date of Birth: 07/30/1966  Subjective/Objective:    Pt admitted with NSTEMI, cath showed severe multi vessel dx- plan for CABG possibly Thur 01/20/16               Action/Plan: PTA pt lived at home with spouse- anticipate return home with spouse- CM will follow post op for pt progression and d/c needs  Expected Discharge Date:                  Expected Discharge Plan:  Home/Self Care  In-House Referral:     Discharge planning Services  CM Consult  Post Acute Care Choice:    Choice offered to:     DME Arranged:    DME Agency:     HH Arranged:    HH Agency:     Status of Service:  In process, will continue to follow  Medicare Important Message Given:  Yes Date Medicare IM Given:    Medicare IM give by:    Date Additional Medicare IM Given:    Additional Medicare Important Message give by:     If discussed at Long Length of Stay Meetings, dates discussed:  01/18/16  Additional Comments:  Darrold Span, RN 01/17/2016, 12:03 PM

## 2016-01-17 NOTE — Progress Notes (Signed)
CARDIAC REHAB PHASE I   Pt walking independently long distance. Sts he had one episode of CP that resolved with rest. Encouraged pt not to leave the floor alone. Pt had questions regarding pain meds post op (deferred to surgeon), diet, smoking cessation, and ex. Very receptive, all questions answered. Will sign off preop and await reorder post-op. Pt is very interested in CRPII after d/c. [phone]  Harriet Masson CES, ACSM 01/17/2016 11:16 AM

## 2016-01-17 NOTE — Progress Notes (Signed)
Utilization review completed.  

## 2016-01-17 NOTE — Progress Notes (Signed)
 Subjective:  No recurrent chest pain; no dyspnea  Objective:   Vital Signs : Filed Vitals:   01/16/16 0538 01/16/16 1359 01/17/16 0506 01/17/16 1114  BP: 107/59 113/56 101/59 120/77  Pulse: 69 56 79 61  Temp: 97.3 F (36.3 C) 98.5 F (36.9 C) 98.1 F (36.7 C)   TempSrc: Oral Oral Oral   Resp: 16 18 18   Height:      Weight: 253 lb 9.6 oz (115.032 kg)  253 lb 1.6 oz (114.805 kg)   SpO2: 99% 100% 100%     Intake/Output from previous day:  Intake/Output Summary (Last 24 hours) at 01/17/16 1347 Last data filed at 01/17/16 0900  Gross per 24 hour  Intake    978 ml  Output    550 ml  Net    428 ml    I/O since admission: +3221  Wt Readings from Last 3 Encounters:  01/17/16 253 lb 1.6 oz (114.805 kg)    Medications: . aspirin  81 mg Oral Daily  . atorvastatin  80 mg Oral q1800  . insulin aspart  0-15 Units Subcutaneous TID WC  . insulin aspart  0-5 Units Subcutaneous QHS  . insulin glargine  20 Units Subcutaneous BID  . lisinopril  2.5 mg Oral Daily  . metoprolol tartrate  25 mg Oral BID  . mupirocin cream   Topical BID  . predniSONE  5 mg Oral QHS  . traZODone  100 mg Oral QHS    . heparin 1,900 Units/hr (01/17/16 1024)  . nitroGLYCERIN 5 mcg/min (01/17/16 1121)    Physical Exam:   General appearance: alert, cooperative and no distress Neck: no adenopathy, no carotid bruit, no JVD, supple, symmetrical, trachea midline and thyroid not enlarged, symmetric, no tenderness/mass/nodules Lungs: decreased BS; no wheezing Heart: regular rate and rhythm Abdomen: soft, non-tender; bowel sounds normal; no masses,  no organomegaly Extremities: no edema, redness or tenderness in the calves or thighs Pulses: 2+ and symmetric Skin: Skin color, texture, turgor normal. No rashes or lesions or numerous tatoos Neurologic: Grossly normal   Rate: 65  Rhythm: normal sinus rhythm  ECG (independently read by me): from 01/12/16:  Sinus at 91 with frequent PVC  Will repeat  ECG today  Lab Results:   Recent Labs  01/15/16 0253  NA 136  K 4.9  CL 107  CO2 24  GLUCOSE 265*  BUN 16  CREATININE 1.01  CALCIUM 9.0    Hepatic Function Latest Ref Rng 01/13/2016  Total Protein 6.5 - 8.1 g/dL 6.2(L)  Albumin 3.5 - 5.0 g/dL 3.6  AST 15 - 41 U/L 47(H)  ALT 17 - 63 U/L 20  Alk Phosphatase 38 - 126 U/L 54  Total Bilirubin 0.3 - 1.2 mg/dL 0.9  Bilirubin, Direct 0.1 - 0.5 mg/dL 0.2     Recent Labs  01/15/16 0253 01/16/16 0052 01/17/16 0406  WBC 9.3 8.5 8.9  HGB 13.9 13.8 13.8  HCT 41.5 41.3 41.4  MCV 89.6 89.6 89.8  PLT 151 159 162    No results for input(s): TROPONINI in the last 72 hours.  Invalid input(s): CK, MB  Lab Results  Component Value Date   TSH 1.299 01/16/2016   No results for input(s): HGBA1C in the last 72 hours.  No results for input(s): PROT, ALBUMIN, AST, ALT, ALKPHOS, BILITOT, BILIDIR, IBILI in the last 72 hours. No results for input(s): INR in the last 72 hours. BNP (last 3 results)  Recent Labs  01/12/16 2141  BNP   85.2    ProBNP (last 3 results) No results for input(s): PROBNP in the last 8760 hours.   Lipid Panel     Component Value Date/Time   CHOL 188 01/13/2016 0342   TRIG 326* 01/13/2016 0342   HDL 34* 01/13/2016 0342   CHOLHDL 5.5 01/13/2016 0342   VLDL 65* 01/13/2016 0342   LDLCALC 89 01/13/2016 0342      Imaging:  Cardiac Cath   Left dominant coronary anatomy  Segmental 95% proximal LAD  Focal 95% mid circumflex bifurcation lesion  80% first obtuse marginal  Severe anterior wall and apical hypokinesis with EF 30-35%   Echo Study Conclusions  - Left ventricle: The cavity size was mildly dilated. Systolic function was normal. The estimated ejection fraction was in the range of 55% to 60%. Wall motion was normal; there were no regional wall motion abnormalities. Features are consistent with a pseudonormal left ventricular filling pattern, with concomitant abnormal  relaxation and increased filling pressure (grade 2 diastolic dysfunction). - Right ventricle: The cavity size was normal. Wall thickness was normal. Systolic function was normal. - Tricuspid valve: There was no regurgitation. - Difficult to assess LVEF and wall motion due to freqent ventricular ectopy. Appears grossly normal.  Assessment/Plan:   Active Problems:   NSTEMI (non-ST elevated myocardial infarction) (HCC)   DM (diabetes mellitus), type 2, uncontrolled with complications (HCC)   Hyperlipidemia   Troponin level elevated   CAD (coronary artery disease)  1. NSTEMI; cath as above; for CABG now rescheduled for Thursday according to patient 2. Hyperlipidemia: on atorvastatin 80 mg daily 3. Ventricular ectopy on BB. 4. Type 2 DM x 12 yrs 5. Tobacco abuse 6. 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 Will titrate metoprolol to 50 mg bid. F/U ECG. Will add fish oil to high potency statin with atherogenic dyslipidemic pattern on lipid panel.   Thomas A. Kelly, MD, FACC 01/17/2016, 1:47 PM 

## 2016-01-17 NOTE — Progress Notes (Signed)
4 Days Post-Op Procedure(s) (LRB): Left Heart Cath and Coronary Angiography (N/A) Subjective: Stable on iv heparin DM control is improving CABG rescheduled for later in week due to urgent cases  Objective: Vital signs in last 24 hours: Temp:  [98.1 F (36.7 C)-98.5 F (36.9 C)] 98.1 F (36.7 C) (01/30 0506) Pulse Rate:  [56-79] 79 (01/30 0506) Cardiac Rhythm:  [-] Normal sinus rhythm (01/29 1900) Resp:  [18] 18 (01/30 0506) BP: (101-113)/(56-59) 101/59 mmHg (01/30 0506) SpO2:  [100 %] 100 % (01/30 0506) Weight:  [253 lb 1.6 oz (114.805 kg)] 253 lb 1.6 oz (114.805 kg) (01/30 0506)  Hemodynamic parameters for last 24 hours:   stable Intake/Output from previous day: 01/29 0701 - 01/30 0700 In: 1338 [P.O.:1080; I.V.:258] Out: 1750 [Urine:1750] Intake/Output this shift:         Exam    General- alert and comfortable   Lungs- clear without rales, wheezes   Cor- regular rate and rhythm, no murmur , gallop   Abdomen- soft, non-tender   Extremities - warm, non-tender, minimal edema   Neuro- oriented, appropriate, no focal weakness   Lab Results:  Recent Labs  01/16/16 0052 01/17/16 0406  WBC 8.5 8.9  HGB 13.8 13.8  HCT 41.3 41.4  PLT 159 162   BMET:  Recent Labs  01/15/16 0253  NA 136  K 4.9  CL 107  CO2 24  GLUCOSE 265*  BUN 16  CREATININE 1.01  CALCIUM 9.0    PT/INR: No results for input(s): LABPROT, INR in the last 72 hours. ABG No results found for: PHART, HCO3, TCO2, ACIDBASEDEF, O2SAT CBG (last 3)   Recent Labs  01/16/16 1604 01/16/16 2137 01/17/16 0641  GLUCAP 152* 164* 186*    Assessment/Plan: S/P Procedure(s) (LRB): Left Heart Cath and Coronary Angiography (N/A) Mobilize Diabetes control cabg thurs am   LOS: 5 days    Kathlee Nations Trigt III 01/17/2016

## 2016-01-18 DIAGNOSIS — I255 Ischemic cardiomyopathy: Secondary | ICD-10-CM

## 2016-01-18 DIAGNOSIS — F319 Bipolar disorder, unspecified: Secondary | ICD-10-CM

## 2016-01-18 LAB — GLUCOSE, CAPILLARY
GLUCOSE-CAPILLARY: 122 mg/dL — AB (ref 65–99)
GLUCOSE-CAPILLARY: 151 mg/dL — AB (ref 65–99)
GLUCOSE-CAPILLARY: 127 mg/dL — AB (ref 65–99)
Glucose-Capillary: 181 mg/dL — ABNORMAL HIGH (ref 65–99)

## 2016-01-18 LAB — HEPARIN LEVEL (UNFRACTIONATED): Heparin Unfractionated: 0.37 IU/mL (ref 0.30–0.70)

## 2016-01-18 MED ORDER — LITHIUM CARBONATE 300 MG PO CAPS
600.0000 mg | ORAL_CAPSULE | Freq: Every day | ORAL | Status: DC
  Administered 2016-01-18 – 2016-01-27 (×9): 600 mg via ORAL
  Filled 2016-01-18 (×11): qty 2

## 2016-01-18 NOTE — Progress Notes (Signed)
ANTICOAGULATION CONSULT NOTE - Follow Up Consult  Pharmacy Consult for Heparin Indication: severe CAD awaiting CABG  Allergies  Allergen Reactions  . Sulfa Antibiotics Other (See Comments)    Childhood     Patient Measurements: Height: 6\' 3"  (190.5 cm) Weight: 253 lb 3.2 oz (114.851 kg) IBW/kg (Calculated) : 84.5 Heparin Dosing Weight: 108kg  Vital Signs: Temp: 98.3 F (36.8 C) (01/31 0527) Temp Source: Oral (01/31 0527) BP: 117/66 mmHg (01/31 1014) Pulse Rate: 64 (01/31 1017)  Labs:  Recent Labs  01/16/16 0052 01/16/16 0503 01/17/16 0406 01/18/16 0302  HGB 13.8  --  13.8  --   HCT 41.3  --  41.4  --   PLT 159  --  162  --   HEPARINUNFRC  --  0.40 0.30 0.37    Estimated Creatinine Clearance: 119.7 mL/min (by C-G formula based on Cr of 1.01).    Assessment:  Anticoagulation: heparin for r/o ACS, restarted after cath for severe CAD, CABG 2/2 (awaiting Brilinta washout). HL 0.37, CBC WNL.  Goal of Therapy:  Heparin level 0.3-0.7 units/ml Monitor platelets by anticoagulation protocol: Yes   Plan:  Continue heparin at 1900 units/hr Daily heparin level and CBC CABG 2/2  Emery Binz S. 01/20/16, PharmD, [facility] Clinical Staff Pharmacist Pager [phone]  [phone] [place] 01/18/2016,11:18 AM

## 2016-01-18 NOTE — Progress Notes (Signed)
Subjective:  No recurrent chest pain; no dyspnea  Objective:   Vital Signs : Filed Vitals:   01/18/16 0527 01/18/16 1014 01/18/16 1017 01/18/16 1344  BP: 97/56 117/66  104/65  Pulse: 56  64 68  Temp: 98.3 F (36.8 C)   98.1 F (36.7 C)  TempSrc: Oral   Oral  Resp: 18   17  Height:      Weight: 253 lb 3.2 oz (114.851 kg)     SpO2: 99%   100%    Intake/Output from previous day:  Intake/Output Summary (Last 24 hours) at 01/18/16 1501 Last data filed at 01/18/16 1343  Gross per 24 hour  Intake    840 ml  Output      0 ml  Net    840 ml    I/O since admission: +3221  Wt Readings from Last 3 Encounters:  01/18/16 253 lb 3.2 oz (114.851 kg)    Medications: . aspirin  81 mg Oral Daily  . atorvastatin  80 mg Oral q1800  . insulin aspart  0-15 Units Subcutaneous TID WC  . insulin aspart  0-5 Units Subcutaneous QHS  . insulin glargine  20 Units Subcutaneous BID  . lisinopril  2.5 mg Oral Daily  . metoprolol tartrate  25 mg Oral BID  . mupirocin cream   Topical BID  . predniSONE  5 mg Oral QHS  . traZODone  100 mg Oral QHS    . heparin 1,900 Units/hr (01/18/16 0258)  . nitroGLYCERIN 5 mcg/min (01/17/16 1121)    Physical Exam:   General appearance: alert, cooperative and no distress Neck: no adenopathy, no carotid bruit, no JVD, supple, symmetrical, trachea midline and thyroid not enlarged, symmetric, no tenderness/mass/nodules Lungs: decreased BS; no wheezing Heart: regular rate and rhythm Abdomen: soft, non-tender; bowel sounds normal; no masses,  no organomegaly Extremities: no edema, redness or tenderness in the calves or thighs Pulses: 2+ and symmetric Skin: Skin color, texture, turgor normal. No rashes or lesions or numerous tatoos Neurologic: Grossly normal   Rate: 65  Rhythm: normal sinus rhythm PVCs  ECG  01/12/16:  Sinus at 91 with frequent PVC  Lab Results:  No results for input(s): NA, K, CL, CO2, GLUCOSE, BUN, CREATININE, CALCIUM, MG, PHOS in  the last 72 hours.  Hepatic Function Latest Ref Rng 01/13/2016  Total Protein 6.5 - 8.1 g/dL 6.2(L)  Albumin 3.5 - 5.0 g/dL 3.6  AST 15 - 41 U/L 47(H)  ALT 17 - 63 U/L 20  Alk Phosphatase 38 - 126 U/L 54  Total Bilirubin 0.3 - 1.2 mg/dL 0.9  Bilirubin, Direct 0.1 - 0.5 mg/dL 0.2     Recent Labs  01/16/16 0052 01/17/16 0406  WBC 8.5 8.9  HGB 13.8 13.8  HCT 41.3 41.4  MCV 89.6 89.8  PLT 159 162    Lipid Panel     Component Value Date/Time   CHOL 188 01/13/2016 0342   TRIG 326* 01/13/2016 0342   HDL 34* 01/13/2016 0342   CHOLHDL 5.5 01/13/2016 0342   VLDL 65* 01/13/2016 0342   LDLCALC 89 01/13/2016 0342      Imaging:  Cardiac Cath   Left dominant coronary anatomy  Segmental 95% proximal LAD  Focal 95% mid circumflex bifurcation lesion  80% first obtuse marginal  Severe anterior wall and apical hypokinesis with EF 30-35%   Echo Study Conclusions  - Left ventricle: The cavity size was mildly dilated. Systolic function was normal. The estimated ejection fraction was in the  range of 55% to 60%. Wall motion was normal; there were no regional wall motion abnormalities. Features are consistent with a pseudonormal left ventricular filling pattern, with concomitant abnormal relaxation and increased filling pressure (grade 2 diastolic dysfunction). - Right ventricle: The cavity size was normal. Wall thickness was normal. Systolic function was normal. - Tricuspid valve: There was no regurgitation. - Difficult to assess LVEF and wall motion due to freqent ventricular ectopy. Appears grossly normal.  Assessment/Plan:   Active Problems:   NSTEMI (non-ST elevated myocardial infarction) ([place])   CAD 3V at cath- needs CABG   Cardiomyopathy, ischemic-EF 30-35% at cath   DM (diabetes mellitus), type 2, uncontrolled with complications ([place])   Hyperlipidemia   Bipolar 1 disorder ([place])  1. NSTEMI; cath as above; for CABG now rescheduled for Thursday  according to patient 2. Hyperlipidemia: on atorvastatin 80 mg daily 3. Ventricular ectopy on BB. 4. Type 2 DM x 12 yrs 5. Tobacco abuse 6. 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 Change IV NTG to paste.   Pt apparently has bi polar disorder and was on Lithium pta-? OK to resume this (RN reports pt complained to her that he is feeling "depressed" she thinks he left the floor today for a few minutes). He relayed none of this to me- "Im doing fine".   Kerin Ransom PA-C 01/18/2016 3:01 PM   Patient seen and examined. Agree with assessment and plan. No recurrent chest pain on heparin and NTG. OK to resume lithium at home dose. Plan CABG on Thursday.   Troy Sine, MD, [facility] 01/18/2016 3:26 PM

## 2016-01-18 NOTE — Consult Note (Signed)
   [facility] CM Inpatient Consult   01/18/2016  Austin Arnold 07-13-1966 [id number] Referral received from inpatient RNCM, Kristi. Patient evaluated for community based chronic disease management services with [facility] Care Management Program as a benefit of patient's [employer]. Spoke with patient at bedside to explain [facility] Care Management services.  Consent form signed. Patient endorses [name], MD as his primary care provider.. Patient will receive post hospital discharge call and will be evaluated for monthly home visits for assessments and disease process education.  Left contact information and [facility] literature at bedside. Made Inpatient Case Manager aware that [facility] Care Management following. Of note, [facility] Care Management services does not replace or interfere with any services that are arranged by inpatient case management or social work.  For additional questions or referrals please contact:   Charlesetta Shanks, RN BSN CCM [facility]  [phone] business mobile phone Toll free office [phone]

## 2016-01-19 ENCOUNTER — Other Ambulatory Visit: Payer: Self-pay

## 2016-01-19 DIAGNOSIS — I2510 Atherosclerotic heart disease of native coronary artery without angina pectoris: Secondary | ICD-10-CM

## 2016-01-19 LAB — HEPARIN LEVEL (UNFRACTIONATED): HEPARIN UNFRACTIONATED: 0.48 [IU]/mL (ref 0.30–0.70)

## 2016-01-19 LAB — GLUCOSE, CAPILLARY
GLUCOSE-CAPILLARY: 168 mg/dL — AB (ref 65–99)
GLUCOSE-CAPILLARY: 104 mg/dL — AB (ref 65–99)
GLUCOSE-CAPILLARY: 167 mg/dL — AB (ref 65–99)
GLUCOSE-CAPILLARY: 208 mg/dL — AB (ref 65–99)
Glucose-Capillary: 180 mg/dL — ABNORMAL HIGH (ref 65–99)

## 2016-01-19 LAB — ABO/RH: ABO/RH(D): O POS

## 2016-01-19 MED ORDER — POTASSIUM CHLORIDE 2 MEQ/ML IV SOLN
80.0000 meq | INTRAVENOUS | Status: DC
  Filled 2016-01-19 (×2): qty 40

## 2016-01-19 MED ORDER — DEXTROSE 5 % IV SOLN
750.0000 mg | INTRAVENOUS | Status: DC
  Filled 2016-01-19 (×2): qty 750

## 2016-01-19 MED ORDER — INSULIN REGULAR HUMAN 100 UNIT/ML IJ SOLN
INTRAMUSCULAR | Status: DC
Start: 2016-01-20 — End: 2016-01-20
  Filled 2016-01-19 (×2): qty 2.5

## 2016-01-19 MED ORDER — DEXTROSE 5 % IV SOLN
1.5000 g | INTRAVENOUS | Status: DC
  Filled 2016-01-19 (×2): qty 1.5

## 2016-01-19 MED ORDER — BISACODYL 5 MG PO TBEC
5.0000 mg | DELAYED_RELEASE_TABLET | Freq: Once | ORAL | Status: DC
  Filled 2016-01-19: qty 1

## 2016-01-19 MED ORDER — PAPAVERINE HCL 30 MG/ML IJ SOLN
INTRAMUSCULAR | Status: DC
  Filled 2016-01-19 (×2): qty 2.5

## 2016-01-19 MED ORDER — DIAZEPAM 5 MG PO TABS
5.0000 mg | ORAL_TABLET | Freq: Once | ORAL | Status: AC
  Administered 2016-01-20: 5 mg via ORAL
  Filled 2016-01-19: qty 1

## 2016-01-19 MED ORDER — DEXMEDETOMIDINE HCL IN NACL 400 MCG/100ML IV SOLN
0.1000 ug/kg/h | INTRAVENOUS | Status: DC
  Filled 2016-01-19 (×2): qty 100

## 2016-01-19 MED ORDER — SODIUM CHLORIDE 0.9 % IV SOLN
INTRAVENOUS | Status: DC
  Filled 2016-01-19 (×2): qty 30

## 2016-01-19 MED ORDER — PHENYLEPHRINE HCL 10 MG/ML IJ SOLN
30.0000 ug/min | INTRAVENOUS | Status: DC
  Filled 2016-01-19 (×2): qty 2

## 2016-01-19 MED ORDER — CHLORHEXIDINE GLUCONATE 4 % EX LIQD
60.0000 mL | Freq: Once | CUTANEOUS | Status: AC
  Administered 2016-01-20: 4 via TOPICAL
  Filled 2016-01-19: qty 60

## 2016-01-19 MED ORDER — DOPAMINE-DEXTROSE 3.2-5 MG/ML-% IV SOLN
0.0000 ug/kg/min | INTRAVENOUS | Status: DC
  Filled 2016-01-19 (×2): qty 250

## 2016-01-19 MED ORDER — CHLORHEXIDINE GLUCONATE 0.12 % MT SOLN
15.0000 mL | Freq: Once | OROMUCOSAL | Status: AC
  Administered 2016-01-20: 15 mL via OROMUCOSAL
  Filled 2016-01-19: qty 15

## 2016-01-19 MED ORDER — TEMAZEPAM 15 MG PO CAPS: 15.0000 mg | ORAL_CAPSULE | Freq: Once | ORAL | Status: DC | PRN

## 2016-01-19 MED ORDER — SODIUM CHLORIDE 0.9 % IV SOLN
INTRAVENOUS | Status: DC
  Filled 2016-01-19 (×2): qty 40

## 2016-01-19 MED ORDER — MAGNESIUM SULFATE 50 % IJ SOLN
40.0000 meq | INTRAMUSCULAR | Status: DC
  Filled 2016-01-19 (×2): qty 10

## 2016-01-19 MED ORDER — NITROGLYCERIN IN D5W 200-5 MCG/ML-% IV SOLN
2.0000 ug/min | INTRAVENOUS | Status: DC
  Filled 2016-01-19 (×2): qty 250

## 2016-01-19 MED ORDER — VANCOMYCIN HCL 10 G IV SOLR
1500.0000 mg | INTRAVENOUS | Status: DC
  Filled 2016-01-19 (×2): qty 1500

## 2016-01-19 MED ORDER — DEXTROSE 5 % IV SOLN
0.0000 ug/min | INTRAVENOUS | Status: DC
  Filled 2016-01-19 (×2): qty 4

## 2016-01-19 MED ORDER — METOPROLOL TARTRATE 12.5 MG HALF TABLET
12.5000 mg | ORAL_TABLET | Freq: Once | ORAL | Status: DC
  Filled 2016-01-19: qty 1

## 2016-01-19 MED ORDER — CHLORHEXIDINE GLUCONATE 4 % EX LIQD
60.0000 mL | Freq: Once | CUTANEOUS | Status: AC
  Administered 2016-01-19: 4 via TOPICAL
  Filled 2016-01-19: qty 60

## 2016-01-19 NOTE — Progress Notes (Signed)
6 Days Post-Op Procedure(s) (LRB): Left Heart Cath and Coronary Angiography (N/A) Subjective:  No chest pain or shortness of breath. Anxious to get surgery over with.  Objective: Vital signs in last 24 hours: Temp:  [97.7 F (36.5 C)-98.3 F (36.8 C)] 97.7 F (36.5 C) (02/01 1350) Pulse Rate:  [52-87] 52 (02/01 1350) Cardiac Rhythm:  [-] Normal sinus rhythm;Heart block (02/01 0748) Resp:  [18] 18 (02/01 1350) BP: (101-127)/(52-74) 101/52 mmHg (02/01 1350) SpO2:  [97 %-100 %] 100 % (02/01 1350) Weight:  [114.08 kg (251 lb 8 oz)] 114.08 kg (251 lb 8 oz) (02/01 0616)  Hemodynamic parameters for last 24 hours:    Intake/Output from previous day: 01/31 0701 - 02/01 0700 In: 1320 [P.O.:1320] Out: -  Intake/Output this shift: Total I/O In: 360 [P.O.:360] Out: -   General appearance: alert and cooperative Heart: regular rate and rhythm, S1, S2 normal, no murmur, click, rub or gallop Lungs: clear to auscultation bilaterally  Lab Results:  Recent Labs  01/17/16 0406  WBC 8.9  HGB 13.8  HCT 41.4  PLT 162   BMET: No results for input(s): NA, K, CL, CO2, GLUCOSE, BUN, CREATININE, CALCIUM in the last 72 hours.  PT/INR: No results for input(s): LABPROT, INR in the last 72 hours. ABG No results found for: PHART, HCO3, TCO2, ACIDBASEDEF, O2SAT CBG (last 3)   Recent Labs  01/19/16 0612 01/19/16 1150 01/19/16 1617  GLUCAP 168* 104* 167*    Assessment/Plan:  Severe multi-vessel coronary disease Plan CABG by Dr. Maren Arnold tomorrow. I reviewed the procedure, benefits and risks with him and answered his questions. Stressed the importance of smoking cessation and maximum risk factor reduction.   LOS: 7 days    Austin Arnold 01/19/2016

## 2016-01-19 NOTE — Progress Notes (Signed)
ANTICOAGULATION CONSULT NOTE - Follow Up Consult  Pharmacy Consult for Heparin Indication: severe CAD awaiting CABG  Allergies  Allergen Reactions  . Sulfa Antibiotics Other (See Comments)    Childhood     Patient Measurements: Height: 6\' 3"  (190.5 cm) Weight: 251 lb 8 oz (114.08 kg) IBW/kg (Calculated) : 84.5 Heparin Dosing Weight: 108kg  Vital Signs: Temp: 97.9 F (36.6 C) (02/01 0616) Temp Source: Oral (02/01 0616) BP: 101/65 mmHg (02/01 0616) Pulse Rate: 55 (02/01 0616)  Labs:  Recent Labs  01/17/16 0406 01/18/16 0302 01/19/16 0400  HGB 13.8  --   --   HCT 41.4  --   --   PLT 162  --   --   HEPARINUNFRC 0.30 0.37 0.48    Estimated Creatinine Clearance: 119.2 mL/min (by C-G formula based on Cr of 1.01).    Assessment:  Anticoagulation: heparin for r/o ACS, restarted after cath for severe CAD, CABG 2/2 (awaiting Brilinta washout). HL 0.42,No CBC today..  Goal of Therapy:  Heparin level 0.3-0.7 units/ml Monitor platelets by anticoagulation protocol: Yes   Plan:  Continue heparin at 1900 units/hr Daily heparin level and CBC (reordered) CABG 2/2  Sequita Wise S. 03/18/16, PharmD, BCPS Clinical Staff Pharmacist Pager [phone]  [phone] [place] 01/19/2016,11:30 AM

## 2016-01-19 NOTE — Care Management Important Message (Signed)
Important Message  Patient Details  Name: Austin Arnold MRN: [id number] Date of Birth: 1966/12/01   Medicare Important Message Given:  Yes    [name] Abena 01/19/2016, 1:28 PM

## 2016-01-19 NOTE — Progress Notes (Signed)
Patient Name: Austin Arnold Date of Encounter: 01/19/2016   SUBJECTIVE  Feeling well. No chest pain, sob or palpitations.   CURRENT MEDS . [START ON 01/20/2016] aminocaproic acid (AMICAR) for OHS   Intravenous To OR  . aspirin  81 mg Oral Daily  . atorvastatin  80 mg Oral q1800  . [START ON 01/20/2016] cefUROXime (ZINACEF)  IV  1.5 g Intravenous To OR  . [START ON 01/20/2016] cefUROXime (ZINACEF)  IV  750 mg Intravenous To OR  . [START ON 01/20/2016] dexmedetomidine  0.1-0.7 mcg/kg/hr Intravenous To OR  . [START ON 01/20/2016] DOPamine  0-10 mcg/kg/min Intravenous To OR  . [START ON 01/20/2016] epinephrine  0-10 mcg/min Intravenous To OR  . [START ON 01/20/2016] heparin-papaverine-plasmalyte irrigation   Irrigation To OR  . [START ON 01/20/2016] heparin 30,000 units/NS 1000 mL solution for CELLSAVER   Other To OR  . insulin aspart  0-15 Units Subcutaneous TID WC  . insulin aspart  0-5 Units Subcutaneous QHS  . insulin glargine  20 Units Subcutaneous BID  . [START ON 01/20/2016] insulin (NOVOLIN-R) infusion   Intravenous To OR  . lisinopril  2.5 mg Oral Daily  . lithium carbonate  600 mg Oral Daily  . [START ON 01/20/2016] magnesium sulfate  40 mEq Other To OR  . metoprolol tartrate  25 mg Oral BID  . mupirocin cream   Topical BID  . [START ON 01/20/2016] nitroGLYCERIN  2-200 mcg/min Intravenous To OR  . [START ON 01/20/2016] phenylephrine (NEO-SYNEPHRINE) Adult infusion  30-200 mcg/min Intravenous To OR  . [START ON 01/20/2016] potassium chloride  80 mEq Other To OR  . predniSONE  5 mg Oral QHS  . traZODone  100 mg Oral QHS  . [START ON 01/20/2016] vancomycin  1,500 mg Intravenous To OR    OBJECTIVE  Filed Vitals:   01/18/16 1344 01/18/16 2143 01/19/16 0616 01/19/16 1350  BP: 104/65 127/74 101/65 101/52  Pulse: 68 87 55 52  Temp: 98.1 F (36.7 C) 98.3 F (36.8 C) 97.9 F (36.6 C) 97.7 F (36.5 C)  TempSrc: Oral Oral Oral Oral  Resp: 17 18 18 18   Height:      Weight:   251 lb 8 oz (114.08  kg)   SpO2: 100% 100% 97% 100%    Intake/Output Summary (Last 24 hours) at 01/19/16 1355 Last data filed at 01/19/16 0700  Gross per 24 hour  Intake    720 ml  Output      0 ml  Net    720 ml   Filed Weights   01/17/16 0506 01/18/16 0527 01/19/16 0616  Weight: 253 lb 1.6 oz (114.805 kg) 253 lb 3.2 oz (114.851 kg) 251 lb 8 oz (114.08 kg)    PHYSICAL EXAM  General: Pleasant, NAD. Neuro: Alert and oriented X 3. Moves all extremities spontaneously. Psych: Normal affect. HEENT:  Normal  Neck: Supple without bruits or JVD. Lungs:  Resp regular and unlabored, CTA. Heart: RRR no s3, s4, or murmurs. Abdomen: Soft, non-tender, non-distended, BS + x 4.  Extremities: No clubbing, cyanosis or edema. DP/PT/Radials 2+ and equal bilaterally.  Accessory Clinical Findings  CBC  Recent Labs  01/17/16 0406  WBC 8.9  HGB 13.8  HCT 41.4  MCV 89.8  PLT 162   Lipid Panel     Component Value Date/Time   CHOL 188 01/13/2016 0342   TRIG 326* 01/13/2016 0342   HDL 34* 01/13/2016 0342   CHOLHDL 5.5 01/13/2016 0342   VLDL 65* 01/13/2016  0342   LDLCALC 89 01/13/2016 0342    TELE  - sinus rhythm with PACs and PVCs  Radiology/Studies  Dg Chest Port 1 View  01/12/2016  CLINICAL DATA:  Chest pain radiating into left arm for several weeks. Shortness of breath. EXAM: PORTABLE CHEST 1 VIEW COMPARISON:  May 18, 2015 FINDINGS: Lungs are clear. Heart size and pulmonary vascularity are normal. No adenopathy. No bone lesions. No pneumothorax. IMPRESSION: No edema or consolidation. Electronically Signed   By: Bretta Bang III M.D.   On: 01/12/2016 20:48    ASSESSMENT AND PLAN Active Problems:   NSTEMI (non-ST elevated myocardial infarction) (HCC)   DM (diabetes mellitus), type 2, uncontrolled with complications (HCC)   Hyperlipidemia   CAD 3V at cath- needs CABG   Cardiomyopathy, ischemic-EF 30-35% at cath   Bipolar 1 disorder (HCC)    Plan: chest pain free. Cath with severe 3V  disease. For CABG tomorrow. Continue IV heparin and IV nitro. Resumed lithium. No feeling depressed.   Lorelei Pont PA-C Pager [phone]   Patient seen and examined. Agree with assessment and plan. No ecurrent chest pain or dyspnea on heparin and iv NTG. CABG tomorrow. Now on high potency statin, low dose ACE-I, and BB.  Lennette Bihari, MD, [facility] 01/19/2016 3:02 PM

## 2016-01-20 ENCOUNTER — Encounter (HOSPITAL_COMMUNITY)
Admission: EM | Disposition: A | Discharge status: Home / Self Care | Payer: Medicare Other | Source: Home / Self Care | Attending: Cardiothoracic Surgery

## 2016-01-20 ENCOUNTER — Inpatient Hospital Stay (HOSPITAL_COMMUNITY): Payer: Medicare Other | Admitting: Anesthesiology

## 2016-01-20 ENCOUNTER — Encounter (HOSPITAL_COMMUNITY): Payer: Self-pay | Admitting: Certified Registered Nurse Anesthetist

## 2016-01-20 ENCOUNTER — Inpatient Hospital Stay (HOSPITAL_COMMUNITY): Payer: Medicare Other

## 2016-01-20 ENCOUNTER — Inpatient Hospital Stay (HOSPITAL_COMMUNITY)
Admit: 2016-01-20 | Discharge: 2016-01-20 | Disposition: A | Discharge status: Home / Self Care | Payer: Medicare Other | Attending: Cardiothoracic Surgery | Admitting: Cardiothoracic Surgery

## 2016-01-20 ENCOUNTER — Other Ambulatory Visit: Payer: Self-pay

## 2016-01-20 DIAGNOSIS — I25110 Atherosclerotic heart disease of native coronary artery with unstable angina pectoris: Secondary | ICD-10-CM

## 2016-01-20 DIAGNOSIS — Z951 Presence of aortocoronary bypass graft: Secondary | ICD-10-CM

## 2016-01-20 HISTORY — PX: CORONARY ARTERY BYPASS GRAFT: SHX141

## 2016-01-20 HISTORY — DX: Presence of aortocoronary bypass graft: Z95.1

## 2016-01-20 HISTORY — PX: TEE WITHOUT CARDIOVERSION: SHX5443

## 2016-01-20 LAB — POCT I-STAT, CHEM 8
BUN: 18 mg/dL (ref 6–20)
BUN: 16 mg/dL (ref 6–20)
BUN: 16 mg/dL (ref 6–20)
BUN: 16 mg/dL (ref 6–20)
BUN: 17 mg/dL (ref 6–20)
BUN: 17 mg/dL (ref 6–20)
BUN: 16 mg/dL (ref 6–20)
BUN: 17 mg/dL (ref 6–20)
CALCIUM ION: 1.26 mmol/L — AB (ref 1.12–1.23)
CALCIUM ION: 1.23 mmol/L (ref 1.12–1.23)
CALCIUM ION: 1.2 mmol/L (ref 1.12–1.23)
CALCIUM ION: 1.28 mmol/L — AB (ref 1.12–1.23)
CHLORIDE: 106 mmol/L (ref 101–111)
CHLORIDE: 101 mmol/L (ref 101–111)
CREATININE: 0.6 mg/dL — AB (ref 0.61–1.24)
CREATININE: 0.7 mg/dL (ref 0.61–1.24)
CREATININE: 0.6 mg/dL — AB (ref 0.61–1.24)
Calcium, Ion: 1.15 mmol/L (ref 1.12–1.23)
Calcium, Ion: 1.16 mmol/L (ref 1.12–1.23)
Calcium, Ion: 1.21 mmol/L (ref 1.12–1.23)
Calcium, Ion: 1.22 mmol/L (ref 1.12–1.23)
Chloride: 106 mmol/L (ref 101–111)
Chloride: 99 mmol/L — ABNORMAL LOW (ref 101–111)
Chloride: 100 mmol/L — ABNORMAL LOW (ref 101–111)
Chloride: 104 mmol/L (ref 101–111)
Chloride: 101 mmol/L (ref 101–111)
Chloride: 104 mmol/L (ref 101–111)
Creatinine, Ser: 0.6 mg/dL — ABNORMAL LOW (ref 0.61–1.24)
Creatinine, Ser: 0.7 mg/dL (ref 0.61–1.24)
Creatinine, Ser: 0.8 mg/dL (ref 0.61–1.24)
Creatinine, Ser: 0.8 mg/dL (ref 0.61–1.24)
Creatinine, Ser: 0.8 mg/dL (ref 0.61–1.24)
GLUCOSE: 141 mg/dL — AB (ref 65–99)
GLUCOSE: 169 mg/dL — AB (ref 65–99)
GLUCOSE: 210 mg/dL — AB (ref 65–99)
Glucose, Bld: 167 mg/dL — ABNORMAL HIGH (ref 65–99)
Glucose, Bld: 162 mg/dL — ABNORMAL HIGH (ref 65–99)
Glucose, Bld: 213 mg/dL — ABNORMAL HIGH (ref 65–99)
Glucose, Bld: 213 mg/dL — ABNORMAL HIGH (ref 65–99)
Glucose, Bld: 140 mg/dL — ABNORMAL HIGH (ref 65–99)
HCT: 42 % (ref 39.0–52.0)
HCT: 36 % — ABNORMAL LOW (ref 39.0–52.0)
HCT: 33 % — ABNORMAL LOW (ref 39.0–52.0)
HEMATOCRIT: 39 % (ref 39.0–52.0)
HEMATOCRIT: 35 % — AB (ref 39.0–52.0)
HEMATOCRIT: 36 % — AB (ref 39.0–52.0)
HEMATOCRIT: 37 % — AB (ref 39.0–52.0)
HEMATOCRIT: 46 % (ref 39.0–52.0)
HEMOGLOBIN: 12.2 g/dL — AB (ref 13.0–17.0)
HEMOGLOBIN: 13.3 g/dL (ref 13.0–17.0)
HEMOGLOBIN: 11.9 g/dL — AB (ref 13.0–17.0)
HEMOGLOBIN: 15.6 g/dL (ref 13.0–17.0)
Hemoglobin: 14.3 g/dL (ref 13.0–17.0)
Hemoglobin: 12.2 g/dL — ABNORMAL LOW (ref 13.0–17.0)
Hemoglobin: 12.6 g/dL — ABNORMAL LOW (ref 13.0–17.0)
Hemoglobin: 11.2 g/dL — ABNORMAL LOW (ref 13.0–17.0)
POTASSIUM: 3.5 mmol/L (ref 3.5–5.1)
POTASSIUM: 5 mmol/L (ref 3.5–5.1)
Potassium: 4 mmol/L (ref 3.5–5.1)
Potassium: 3.3 mmol/L — ABNORMAL LOW (ref 3.5–5.1)
Potassium: 4.1 mmol/L (ref 3.5–5.1)
Potassium: 5 mmol/L (ref 3.5–5.1)
Potassium: 4.8 mmol/L (ref 3.5–5.1)
Potassium: 4.5 mmol/L (ref 3.5–5.1)
SODIUM: 140 mmol/L (ref 135–145)
SODIUM: 138 mmol/L (ref 135–145)
SODIUM: 138 mmol/L (ref 135–145)
SODIUM: 137 mmol/L (ref 135–145)
SODIUM: 140 mmol/L (ref 135–145)
Sodium: 139 mmol/L (ref 135–145)
Sodium: 135 mmol/L (ref 135–145)
Sodium: 135 mmol/L (ref 135–145)
TCO2: 26 mmol/L (ref 0–100)
TCO2: 25 mmol/L (ref 0–100)
TCO2: 28 mmol/L (ref 0–100)
TCO2: 25 mmol/L (ref 0–100)
TCO2: 23 mmol/L (ref 0–100)
TCO2: 26 mmol/L (ref 0–100)
TCO2: 26 mmol/L (ref 0–100)
TCO2: 24 mmol/L (ref 0–100)

## 2016-01-20 LAB — POCT I-STAT 3, ART BLOOD GAS (G3+)
ACID-BASE DEFICIT: 3 mmol/L — AB (ref 0.0–2.0)
ACID-BASE DEFICIT: 2 mmol/L (ref 0.0–2.0)
Acid-base deficit: 2 mmol/L (ref 0.0–2.0)
Acid-base deficit: 2 mmol/L (ref 0.0–2.0)
Bicarbonate: 23.4 mEq/L (ref 20.0–24.0)
Bicarbonate: 25.1 mEq/L — ABNORMAL HIGH (ref 20.0–24.0)
Bicarbonate: 25.4 mEq/L — ABNORMAL HIGH (ref 20.0–24.0)
Bicarbonate: 24.8 mEq/L — ABNORMAL HIGH (ref 20.0–24.0)
Bicarbonate: 24.1 mEq/L — ABNORMAL HIGH (ref 20.0–24.0)
O2 SAT: 100 %
O2 Saturation: 100 %
O2 Saturation: 100 %
O2 Saturation: 97 %
O2 Saturation: 96 %
PCO2 ART: 49.6 mmHg — AB (ref 35.0–45.0)
PCO2 ART: 46.6 mmHg — AB (ref 35.0–45.0)
PCO2 ART: 45.5 mmHg — AB (ref 35.0–45.0)
PH ART: 7.325 — AB (ref 7.350–7.450)
PH ART: 7.311 — AB (ref 7.350–7.450)
PH ART: 7.333 — AB (ref 7.350–7.450)
PO2 ART: 250 mmHg — AB (ref 80.0–100.0)
Patient temperature: 36.5
Patient temperature: 37.2
TCO2: 25 mmol/L (ref 0–100)
TCO2: 27 mmol/L (ref 0–100)
TCO2: 27 mmol/L (ref 0–100)
TCO2: 26 mmol/L (ref 0–100)
TCO2: 25 mmol/L (ref 0–100)
pCO2 arterial: 44.9 mmHg (ref 35.0–45.0)
pCO2 arterial: 45.5 mmHg — ABNORMAL HIGH (ref 35.0–45.0)
pH, Arterial: 7.354 (ref 7.350–7.450)
pH, Arterial: 7.331 — ABNORMAL LOW (ref 7.350–7.450)
pO2, Arterial: 228 mmHg — ABNORMAL HIGH (ref 80.0–100.0)
pO2, Arterial: 370 mmHg — ABNORMAL HIGH (ref 80.0–100.0)
pO2, Arterial: 98 mmHg (ref 80.0–100.0)
pO2, Arterial: 92 mmHg (ref 80.0–100.0)

## 2016-01-20 LAB — POCT I-STAT 4, (NA,K, GLUC, HGB,HCT)
Glucose, Bld: 180 mg/dL — ABNORMAL HIGH (ref 65–99)
HCT: 42 % (ref 39.0–52.0)
HEMOGLOBIN: 14.3 g/dL (ref 13.0–17.0)
Potassium: 4.4 mmol/L (ref 3.5–5.1)
SODIUM: 139 mmol/L (ref 135–145)

## 2016-01-20 LAB — BASIC METABOLIC PANEL
Anion gap: 10 (ref 5–15)
BUN: 17 mg/dL (ref 6–20)
CALCIUM: 9.6 mg/dL (ref 8.9–10.3)
CO2: 26 mmol/L (ref 22–32)
CREATININE: 1.05 mg/dL (ref 0.61–1.24)
Chloride: 102 mmol/L (ref 101–111)
GFR calc non Af Amer: >60 mL/min (ref >60)
Glucose, Bld: 229 mg/dL — ABNORMAL HIGH (ref 65–99)
Potassium: 4.5 mmol/L (ref 3.5–5.1)
SODIUM: 138 mmol/L (ref 135–145)

## 2016-01-20 LAB — CBC
HCT: 45 % (ref 39.0–52.0)
HCT: 41.8 % (ref 39.0–52.0)
HCT: 43.7 % (ref 39.0–52.0)
Hemoglobin: 15.4 g/dL (ref 13.0–17.0)
Hemoglobin: 14.4 g/dL (ref 13.0–17.0)
Hemoglobin: 15.3 g/dL (ref 13.0–17.0)
MCH: 30.7 pg (ref 26.0–34.0)
MCH: 30.7 pg (ref 26.0–34.0)
MCH: 31.3 pg (ref 26.0–34.0)
MCHC: 34.2 g/dL (ref 30.0–36.0)
MCHC: 34.4 g/dL (ref 30.0–36.0)
MCHC: 35 g/dL (ref 30.0–36.0)
MCV: 89.6 fL (ref 78.0–100.0)
MCV: 89.1 fL (ref 78.0–100.0)
MCV: 89.4 fL (ref 78.0–100.0)
PLATELETS: 106 10*3/uL — AB (ref 150–400)
Platelets: 170 10*3/uL (ref 150–400)
Platelets: 122 10*3/uL — ABNORMAL LOW (ref 150–400)
RBC: 5.02 MIL/uL (ref 4.22–5.81)
RBC: 4.69 MIL/uL (ref 4.22–5.81)
RBC: 4.89 MIL/uL (ref 4.22–5.81)
RDW: 13.2 % (ref 11.5–15.5)
RDW: 13.1 % (ref 11.5–15.5)
RDW: 13.2 % (ref 11.5–15.5)
WBC: 9.3 10*3/uL (ref 4.0–10.5)
WBC: 13.2 10*3/uL — AB (ref 4.0–10.5)
WBC: 16.5 10*3/uL — ABNORMAL HIGH (ref 4.0–10.5)

## 2016-01-20 LAB — BLOOD GAS, ARTERIAL
ACID-BASE DEFICIT: 2 mmol/L (ref 0.0–2.0)
Bicarbonate: 22.6 mEq/L (ref 20.0–24.0)
DRAWN BY: 225631
FIO2: 0.21
O2 SAT: 94.4 %
PATIENT TEMPERATURE: 98.6
PH ART: 7.363 (ref 7.350–7.450)
TCO2: 23.8 mmol/L (ref 0–100)
pCO2 arterial: 40.7 mmHg (ref 35.0–45.0)
pO2, Arterial: 70.6 mmHg — ABNORMAL LOW (ref 80.0–100.0)

## 2016-01-20 LAB — HEPARIN LEVEL (UNFRACTIONATED): HEPARIN UNFRACTIONATED: 0.31 [IU]/mL (ref 0.30–0.70)

## 2016-01-20 LAB — GLUCOSE, CAPILLARY: GLUCOSE-CAPILLARY: 180 mg/dL — AB (ref 65–99)

## 2016-01-20 LAB — PROTIME-INR
INR: 1.05 (ref 0.00–1.49)
INR: 1.34 (ref 0.00–1.49)
PROTHROMBIN TIME: 16.7 s — AB (ref 11.6–15.2)
Prothrombin Time: 13.9 seconds (ref 11.6–15.2)

## 2016-01-20 LAB — MAGNESIUM: Magnesium: 2.5 mg/dL — ABNORMAL HIGH (ref 1.7–2.4)

## 2016-01-20 LAB — PLATELET COUNT: Platelets: 99 10*3/uL — ABNORMAL LOW (ref 150–400)

## 2016-01-20 LAB — CREATININE, SERUM
Creatinine, Ser: 1.04 mg/dL (ref 0.61–1.24)
GFR calc Af Amer: >60 mL/min (ref >60)
GFR calc non Af Amer: >60 mL/min (ref >60)

## 2016-01-20 LAB — HEMOGLOBIN AND HEMATOCRIT, BLOOD
HCT: 34.8 % — ABNORMAL LOW (ref 39.0–52.0)
Hemoglobin: 12.1 g/dL — ABNORMAL LOW (ref 13.0–17.0)

## 2016-01-20 LAB — APTT
APTT: 74 s — AB (ref 24–37)
aPTT: 28 seconds (ref 24–37)

## 2016-01-20 LAB — PREPARE RBC (CROSSMATCH)

## 2016-01-20 SURGERY — CORONARY ARTERY BYPASS GRAFTING (CABG)
Anesthesia: General | Site: Chest

## 2016-01-20 MED ORDER — LACTATED RINGERS IV SOLN: 500.0000 mL | Freq: Once | INTRAVENOUS | Status: DC | PRN

## 2016-01-20 MED ORDER — METOPROLOL TARTRATE 12.5 MG HALF TABLET
12.5000 mg | ORAL_TABLET | Freq: Two times a day (BID) | ORAL | Status: DC
  Administered 2016-01-21: 12.5 mg via ORAL
  Filled 2016-01-20 (×4): qty 1

## 2016-01-20 MED ORDER — SODIUM CHLORIDE 0.9 % IV SOLN: 250.0000 mL | INTRAVENOUS | Status: DC

## 2016-01-20 MED ORDER — MAGNESIUM SULFATE 4 GM/100ML IV SOLN
4.0000 g | Freq: Once | INTRAVENOUS | Status: AC
  Administered 2016-01-20: 4 g via INTRAVENOUS
  Filled 2016-01-20: qty 100

## 2016-01-20 MED ORDER — VANCOMYCIN HCL IN DEXTROSE 1-5 GM/200ML-% IV SOLN
1000.0000 mg | Freq: Once | INTRAVENOUS | Status: DC
  Filled 2016-01-20: qty 200

## 2016-01-20 MED ORDER — BISACODYL 5 MG PO TBEC
10.0000 mg | DELAYED_RELEASE_TABLET | Freq: Every day | ORAL | Status: DC
  Administered 2016-01-21: 10 mg via ORAL
  Filled 2016-01-20 (×2): qty 2

## 2016-01-20 MED ORDER — DOPAMINE-DEXTROSE 1.6-5 MG/ML-% IV SOLN
INTRAVENOUS | Status: DC | PRN
  Administered 2016-01-20: 3 ug/kg/min via INTRAVENOUS

## 2016-01-20 MED ORDER — HYDROCORTISONE NA SUCCINATE PF 1000 MG IJ SOLR
INTRAMUSCULAR | Status: DC | PRN
  Administered 2016-01-20: 100 mg via INTRAVENOUS

## 2016-01-20 MED ORDER — METOCLOPRAMIDE HCL 5 MG/ML IJ SOLN
10.0000 mg | Freq: Four times a day (QID) | INTRAMUSCULAR | Status: AC
  Administered 2016-01-20 – 2016-01-21 (×3): 10 mg via INTRAVENOUS
  Filled 2016-01-20 (×3): qty 2

## 2016-01-20 MED ORDER — PHENYLEPHRINE 40 MCG/ML (10ML) SYRINGE FOR IV PUSH (FOR BLOOD PRESSURE SUPPORT)
PREFILLED_SYRINGE | INTRAVENOUS | Status: AC
  Filled 2016-01-20: qty 20

## 2016-01-20 MED ORDER — HEMOSTATIC AGENTS (NO CHARGE) OPTIME
TOPICAL | Status: DC | PRN
  Administered 2016-01-20: 1 via TOPICAL

## 2016-01-20 MED ORDER — BISACODYL 10 MG RE SUPP: 10.0000 mg | Freq: Every day | RECTAL | Status: DC

## 2016-01-20 MED ORDER — FENTANYL CITRATE (PF) 100 MCG/2ML IJ SOLN
INTRAMUSCULAR | Status: DC | PRN
  Administered 2016-01-20: 150 ug via INTRAVENOUS
  Administered 2016-01-20: 50 ug via INTRAVENOUS
  Administered 2016-01-20: 150 ug via INTRAVENOUS
  Administered 2016-01-20: 250 ug via INTRAVENOUS
  Administered 2016-01-20: 100 ug via INTRAVENOUS
  Administered 2016-01-20: 200 ug via INTRAVENOUS
  Administered 2016-01-20: 250 ug via INTRAVENOUS
  Administered 2016-01-20: 100 ug via INTRAVENOUS
  Administered 2016-01-20: 250 ug via INTRAVENOUS

## 2016-01-20 MED ORDER — ASPIRIN EC 325 MG PO TBEC
325.0000 mg | DELAYED_RELEASE_TABLET | Freq: Every day | ORAL | Status: DC
  Administered 2016-01-21: 325 mg via ORAL
  Filled 2016-01-20 (×2): qty 1

## 2016-01-20 MED ORDER — DEXTROSE 5 % IV SOLN
1.5000 g | Freq: Two times a day (BID) | INTRAVENOUS | Status: DC
  Administered 2016-01-21 – 2016-01-22 (×3): 1.5 g via INTRAVENOUS
  Filled 2016-01-20 (×4): qty 1.5

## 2016-01-20 MED ORDER — SODIUM CHLORIDE 0.9 % IV SOLN
0.5000 g/h | Freq: Once | INTRAVENOUS | Status: DC
  Filled 2016-01-20: qty 20

## 2016-01-20 MED ORDER — LACTATED RINGERS IV SOLN
INTRAVENOUS | Status: DC | PRN
  Administered 2016-01-20: 08:00:00 via INTRAVENOUS

## 2016-01-20 MED ORDER — LACTATED RINGERS IV SOLN
INTRAVENOUS | Status: DC | PRN
Start: 2016-01-20 — End: 2016-01-20
  Administered 2016-01-20: 08:00:00 via INTRAVENOUS

## 2016-01-20 MED ORDER — HYDROCORTISONE NA SUCCINATE PF 100 MG IJ SOLR
INTRAMUSCULAR | Status: AC
  Filled 2016-01-20: qty 2

## 2016-01-20 MED ORDER — TRAMADOL HCL 50 MG PO TABS
50.0000 mg | ORAL_TABLET | ORAL | Status: DC | PRN
  Administered 2016-01-21 – 2016-01-22 (×2): 100 mg via ORAL
  Filled 2016-01-20 (×2): qty 2

## 2016-01-20 MED ORDER — ONDANSETRON HCL 4 MG/2ML IJ SOLN: 4.0000 mg | Freq: Four times a day (QID) | INTRAMUSCULAR | Status: DC | PRN

## 2016-01-20 MED ORDER — SODIUM CHLORIDE 0.9 % IV SOLN
10.0000 g | INTRAVENOUS | Status: DC | PRN
  Administered 2016-01-20: 5 g/h via INTRAVENOUS

## 2016-01-20 MED ORDER — METOPROLOL TARTRATE 25 MG/10 ML ORAL SUSPENSION
12.5000 mg | Freq: Two times a day (BID) | ORAL | Status: DC
  Administered 2016-01-21: 12.5 mg
  Filled 2016-01-20: qty 5

## 2016-01-20 MED ORDER — MIDAZOLAM HCL 5 MG/5ML IJ SOLN
INTRAMUSCULAR | Status: DC | PRN
  Administered 2016-01-20 (×2): 2 mg via INTRAVENOUS
  Administered 2016-01-20: 1 mg via INTRAVENOUS
  Administered 2016-01-20: 3 mg via INTRAVENOUS
  Administered 2016-01-20: 2 mg via INTRAVENOUS

## 2016-01-20 MED ORDER — METOPROLOL TARTRATE 1 MG/ML IV SOLN
2.5000 mg | INTRAVENOUS | Status: DC | PRN
  Administered 2016-01-20: 5 mg via INTRAVENOUS

## 2016-01-20 MED ORDER — MORPHINE SULFATE (PF) 2 MG/ML IV SOLN
2.0000 mg | INTRAVENOUS | Status: DC | PRN
  Administered 2016-01-20 – 2016-01-21 (×6): 4 mg via INTRAVENOUS
  Administered 2016-01-21: 2 mg via INTRAVENOUS
  Administered 2016-01-21: 4 mg via INTRAVENOUS
  Filled 2016-01-20: qty 1
  Filled 2016-01-20 (×2): qty 2
  Filled 2016-01-20: qty 1
  Filled 2016-01-20 (×6): qty 2

## 2016-01-20 MED ORDER — PANTOPRAZOLE SODIUM 40 MG PO TBEC
40.0000 mg | DELAYED_RELEASE_TABLET | Freq: Every day | ORAL | Status: DC
  Filled 2016-01-20: qty 1

## 2016-01-20 MED ORDER — SODIUM CHLORIDE 0.9 % IV SOLN: INTRAVENOUS | Status: DC

## 2016-01-20 MED ORDER — DOPAMINE-DEXTROSE 3.2-5 MG/ML-% IV SOLN
3.0000 ug/kg/min | INTRAVENOUS | Status: DC
  Administered 2016-01-22: 3 ug/kg/min via INTRAVENOUS
  Filled 2016-01-20: qty 250

## 2016-01-20 MED ORDER — ACETAMINOPHEN 160 MG/5ML PO SOLN: 1000.0000 mg | Freq: Four times a day (QID) | ORAL | Status: DC

## 2016-01-20 MED ORDER — HEPARIN SODIUM (PORCINE) 1000 UNIT/ML IJ SOLN
INTRAMUSCULAR | Status: AC
  Filled 2016-01-20: qty 1

## 2016-01-20 MED ORDER — PHENYLEPHRINE HCL 10 MG/ML IJ SOLN
10.0000 mg | INTRAVENOUS | Status: DC | PRN
  Administered 2016-01-20: 15 ug/min via INTRAVENOUS

## 2016-01-20 MED ORDER — SODIUM CHLORIDE 0.45 % IV SOLN: INTRAVENOUS | Status: DC | PRN

## 2016-01-20 MED ORDER — POTASSIUM CHLORIDE 10 MEQ/50ML IV SOLN: 10.0000 meq | INTRAVENOUS | Status: AC

## 2016-01-20 MED ORDER — DEXMEDETOMIDINE HCL IN NACL 400 MCG/100ML IV SOLN
INTRAVENOUS | Status: DC | PRN
  Administered 2016-01-20: .3 ug/kg/h via INTRAVENOUS

## 2016-01-20 MED ORDER — ROCURONIUM BROMIDE 100 MG/10ML IV SOLN
INTRAVENOUS | Status: DC | PRN
  Administered 2016-01-20 (×3): 50 mg via INTRAVENOUS
  Administered 2016-01-20: 20 mg via INTRAVENOUS
  Administered 2016-01-20: 80 mg via INTRAVENOUS
  Administered 2016-01-20 (×2): 50 mg via INTRAVENOUS

## 2016-01-20 MED ORDER — PROTAMINE SULFATE 10 MG/ML IV SOLN
INTRAVENOUS | Status: AC
  Filled 2016-01-20: qty 25

## 2016-01-20 MED ORDER — MIDAZOLAM HCL 2 MG/2ML IJ SOLN
2.0000 mg | INTRAMUSCULAR | Status: DC | PRN
  Administered 2016-01-20 (×2): 2 mg via INTRAVENOUS
  Filled 2016-01-20 (×2): qty 2

## 2016-01-20 MED ORDER — FENTANYL CITRATE (PF) 250 MCG/5ML IJ SOLN
INTRAMUSCULAR | Status: AC
  Filled 2016-01-20: qty 5

## 2016-01-20 MED ORDER — CALCIUM CHLORIDE 10 % IV SOLN
INTRAVENOUS | Status: DC | PRN
  Administered 2016-01-20: 250 mg via INTRAVENOUS

## 2016-01-20 MED ORDER — SODIUM CHLORIDE 0.9 % IV SOLN
INTRAVENOUS | Status: DC
  Administered 2016-01-20: 3.2 [IU]/h via INTRAVENOUS
  Filled 2016-01-20 (×2): qty 2.5

## 2016-01-20 MED ORDER — NITROGLYCERIN IN D5W 200-5 MCG/ML-% IV SOLN: 0.0000 ug/min | INTRAVENOUS | Status: DC

## 2016-01-20 MED ORDER — PROPOFOL 10 MG/ML IV BOLUS
INTRAVENOUS | Status: AC
  Filled 2016-01-20: qty 20

## 2016-01-20 MED ORDER — ALBUMIN HUMAN 5 % IV SOLN
250.0000 mL | INTRAVENOUS | Status: AC | PRN
  Administered 2016-01-20 – 2016-01-21 (×3): 250 mL via INTRAVENOUS
  Filled 2016-01-20: qty 250

## 2016-01-20 MED ORDER — PROTAMINE SULFATE 10 MG/ML IV SOLN
INTRAVENOUS | Status: DC | PRN
Start: 2016-01-20 — End: 2016-01-20
  Administered 2016-01-20: 350 mg via INTRAVENOUS

## 2016-01-20 MED ORDER — PHENYLEPHRINE 40 MCG/ML (10ML) SYRINGE FOR IV PUSH (FOR BLOOD PRESSURE SUPPORT)
PREFILLED_SYRINGE | INTRAVENOUS | Status: AC
  Filled 2016-01-20: qty 10

## 2016-01-20 MED ORDER — CETYLPYRIDINIUM CHLORIDE 0.05 % MT LIQD
7.0000 mL | Freq: Two times a day (BID) | OROMUCOSAL | Status: DC
  Administered 2016-01-20 – 2016-01-27 (×8): 7 mL via OROMUCOSAL

## 2016-01-20 MED ORDER — SODIUM CHLORIDE 0.9% FLUSH: 3.0000 mL | INTRAVENOUS | Status: DC | PRN

## 2016-01-20 MED ORDER — FENTANYL CITRATE (PF) 250 MCG/5ML IJ SOLN
INTRAMUSCULAR | Status: AC
  Filled 2016-01-20: qty 25

## 2016-01-20 MED ORDER — INSULIN REGULAR BOLUS VIA INFUSION
0.0000 [IU] | Freq: Three times a day (TID) | INTRAVENOUS | Status: DC
  Filled 2016-01-20: qty 10

## 2016-01-20 MED ORDER — OXYCODONE HCL 5 MG PO TABS
5.0000 mg | ORAL_TABLET | ORAL | Status: DC | PRN
  Administered 2016-01-20 – 2016-01-22 (×6): 10 mg via ORAL
  Filled 2016-01-20 (×6): qty 2

## 2016-01-20 MED ORDER — ACETAMINOPHEN 650 MG RE SUPP
650.0000 mg | Freq: Once | RECTAL | Status: AC
  Administered 2016-01-20: 650 mg via RECTAL

## 2016-01-20 MED ORDER — MIDAZOLAM HCL 10 MG/2ML IJ SOLN
INTRAMUSCULAR | Status: AC
  Filled 2016-01-20: qty 2

## 2016-01-20 MED ORDER — 0.9 % SODIUM CHLORIDE (POUR BTL) OPTIME
TOPICAL | Status: DC | PRN
  Administered 2016-01-20 (×3): 1000 mL

## 2016-01-20 MED ORDER — LACTATED RINGERS IV SOLN: INTRAVENOUS | Status: DC

## 2016-01-20 MED ORDER — SODIUM CHLORIDE 0.9% FLUSH
3.0000 mL | Freq: Two times a day (BID) | INTRAVENOUS | Status: DC
  Administered 2016-01-21: 3 mL via INTRAVENOUS

## 2016-01-20 MED ORDER — PLASMA-LYTE 148 IV SOLN
INTRAVENOUS | Status: DC
  Filled 2016-01-20: qty 2.5

## 2016-01-20 MED ORDER — CEFUROXIME SODIUM 1.5 G IJ SOLR
1.5000 g | INTRAMUSCULAR | Status: DC | PRN
  Administered 2016-01-20: .75 g via INTRAVENOUS
  Administered 2016-01-20: 1.5 g via INTRAVENOUS

## 2016-01-20 MED ORDER — FAMOTIDINE IN NACL 20-0.9 MG/50ML-% IV SOLN
20.0000 mg | Freq: Two times a day (BID) | INTRAVENOUS | Status: AC
  Administered 2016-01-20: 20 mg via INTRAVENOUS

## 2016-01-20 MED ORDER — MORPHINE SULFATE (PF) 2 MG/ML IV SOLN
1.0000 mg | INTRAVENOUS | Status: AC | PRN
  Administered 2016-01-20: 2 mg via INTRAVENOUS
  Administered 2016-01-20: 4 mg via INTRAVENOUS

## 2016-01-20 MED ORDER — ROCURONIUM BROMIDE 50 MG/5ML IV SOLN
INTRAVENOUS | Status: AC
  Filled 2016-01-20: qty 1

## 2016-01-20 MED ORDER — SODIUM CHLORIDE 0.9 % IV SOLN
250.0000 [IU] | INTRAVENOUS | Status: DC | PRN
  Administered 2016-01-20: 1.6 [IU]/h via INTRAVENOUS

## 2016-01-20 MED ORDER — DOCUSATE SODIUM 100 MG PO CAPS
200.0000 mg | ORAL_CAPSULE | Freq: Every day | ORAL | Status: DC
  Administered 2016-01-21: 200 mg via ORAL
  Filled 2016-01-20 (×2): qty 2

## 2016-01-20 MED ORDER — ASPIRIN 81 MG PO CHEW: 324.0000 mg | CHEWABLE_TABLET | Freq: Every day | ORAL | Status: DC

## 2016-01-20 MED ORDER — ACETAMINOPHEN 160 MG/5ML PO SOLN
650.0000 mg | Freq: Once | ORAL | Status: AC
Start: 2016-01-20 — End: 2016-01-20

## 2016-01-20 MED ORDER — DEXMEDETOMIDINE HCL IN NACL 200 MCG/50ML IV SOLN
0.0000 ug/kg/h | INTRAVENOUS | Status: DC
  Administered 2016-01-20: 0.1 ug/kg/h via INTRAVENOUS
  Filled 2016-01-20: qty 50

## 2016-01-20 MED ORDER — PROPOFOL 10 MG/ML IV BOLUS
INTRAVENOUS | Status: DC | PRN
  Administered 2016-01-20: 80 mg via INTRAVENOUS

## 2016-01-20 MED ORDER — LACTATED RINGERS IV SOLN
INTRAVENOUS | Status: DC | PRN
  Administered 2016-01-20 (×2): via INTRAVENOUS

## 2016-01-20 MED ORDER — PAPAVERINE HCL 30 MG/ML IJ SOLN
INTRAMUSCULAR | Status: DC | PRN
  Administered 2016-01-20: 10:00:00 via INTRAVASCULAR

## 2016-01-20 MED ORDER — VANCOMYCIN HCL 1000 MG IV SOLR
1000.0000 mg | INTRAVENOUS | Status: DC | PRN
  Administered 2016-01-20: 1500 mg via INTRAVENOUS

## 2016-01-20 MED ORDER — CHLORHEXIDINE GLUCONATE 0.12 % MT SOLN
15.0000 mL | OROMUCOSAL | Status: AC
  Administered 2016-01-20: 15 mL via OROMUCOSAL

## 2016-01-20 MED ORDER — PROTAMINE SULFATE 10 MG/ML IV SOLN
INTRAVENOUS | Status: AC
  Filled 2016-01-20: qty 10

## 2016-01-20 MED ORDER — ACETAMINOPHEN 500 MG PO TABS
1000.0000 mg | ORAL_TABLET | Freq: Four times a day (QID) | ORAL | Status: DC
  Administered 2016-01-21 – 2016-01-22 (×6): 1000 mg via ORAL
  Filled 2016-01-20 (×6): qty 2

## 2016-01-20 MED ORDER — KETOROLAC TROMETHAMINE 30 MG/ML IJ SOLN
30.0000 mg | Freq: Four times a day (QID) | INTRAMUSCULAR | Status: DC
  Administered 2016-01-20 – 2016-01-21 (×2): 30 mg via INTRAVENOUS
  Filled 2016-01-20 (×2): qty 1

## 2016-01-20 MED ORDER — HEPARIN SODIUM (PORCINE) 1000 UNIT/ML IJ SOLN
INTRAMUSCULAR | Status: DC | PRN
  Administered 2016-01-20: 3000 [IU] via INTRAVENOUS
  Administered 2016-01-20: 32000 [IU] via INTRAVENOUS
  Administered 2016-01-20: 2000 [IU] via INTRAVENOUS

## 2016-01-20 MED ORDER — PHENYLEPHRINE HCL 10 MG/ML IJ SOLN
0.0000 ug/min | INTRAVENOUS | Status: DC
  Administered 2016-01-21: 20 ug/min via INTRAVENOUS
  Filled 2016-01-20 (×2): qty 2

## 2016-01-20 MED ORDER — ROCURONIUM BROMIDE 50 MG/5ML IV SOLN
INTRAVENOUS | Status: AC
  Filled 2016-01-20: qty 6

## 2016-01-20 MED ORDER — VANCOMYCIN HCL IN DEXTROSE 1-5 GM/200ML-% IV SOLN
1000.0000 mg | Freq: Two times a day (BID) | INTRAVENOUS | Status: AC
  Administered 2016-01-20 – 2016-01-21 (×3): 1000 mg via INTRAVENOUS
  Filled 2016-01-20 (×3): qty 200

## 2016-01-20 MED FILL — Heparin Sodium (Porcine) Inj 1000 Unit/ML: INTRAMUSCULAR | Qty: 30 | Status: AC

## 2016-01-20 MED FILL — Lidocaine HCl IV Inj 20 MG/ML: INTRAVENOUS | Qty: 10 | Status: AC

## 2016-01-20 MED FILL — Mannitol IV Soln 20%: INTRAVENOUS | Qty: 500 | Status: AC

## 2016-01-20 MED FILL — Sodium Chloride IV Soln 0.9%: INTRAVENOUS | Qty: 3000 | Status: AC

## 2016-01-20 MED FILL — Magnesium Sulfate Inj 50%: INTRAMUSCULAR | Qty: 10 | Status: AC

## 2016-01-20 MED FILL — Heparin Sodium (Porcine) Inj 1000 Unit/ML: INTRAMUSCULAR | Qty: 60 | Status: AC

## 2016-01-20 MED FILL — Sodium Bicarbonate IV Soln 8.4%: INTRAVENOUS | Qty: 50 | Status: AC

## 2016-01-20 MED FILL — Electrolyte-R (PH 7.4) Solution: INTRAVENOUS | Qty: 4000 | Status: AC

## 2016-01-20 MED FILL — Potassium Chloride Inj 2 mEq/ML: INTRAVENOUS | Qty: 40 | Status: AC

## 2016-01-20 SURGICAL SUPPLY — 102 items
ADAPTER CARDIO PERF ANTE/RETRO (ADAPTER) ×4 IMPLANT
BAG DECANTER FOR FLEXI CONT (MISCELLANEOUS) ×4 IMPLANT
BANDAGE ACE 4X5 VEL STRL LF (GAUZE/BANDAGES/DRESSINGS) ×4 IMPLANT
BANDAGE ACE 6X5 VEL STRL LF (GAUZE/BANDAGES/DRESSINGS) ×4 IMPLANT
BANDAGE ELASTIC 4 VELCRO ST LF (GAUZE/BANDAGES/DRESSINGS) ×4 IMPLANT
BANDAGE ELASTIC 6 VELCRO ST LF (GAUZE/BANDAGES/DRESSINGS) ×4 IMPLANT
BASKET HEART  (ORDER IN 25'S) (MISCELLANEOUS) ×1
BASKET HEART (ORDER IN 25'S) (MISCELLANEOUS) ×1
BASKET HEART (ORDER IN 25S) (MISCELLANEOUS) ×2 IMPLANT
BLADE STERNUM SYSTEM 6 (BLADE) ×4 IMPLANT
BLADE SURG 11 STRL SS (BLADE) ×4 IMPLANT
BLADE SURG 12 STRL SS (BLADE) ×4 IMPLANT
BLADE SURG ROTATE 9660 (MISCELLANEOUS) IMPLANT
BNDG GAUZE ELAST 4 BULKY (GAUZE/BANDAGES/DRESSINGS) ×4 IMPLANT
CANISTER SUCTION 2500CC (MISCELLANEOUS) ×4 IMPLANT
CANNULA ARTERIAL NVNT 3/8 22FR (MISCELLANEOUS) ×4 IMPLANT
CANNULA GUNDRY RCSP 15FR (MISCELLANEOUS) ×4 IMPLANT
CATH CPB KIT VANTRIGT (MISCELLANEOUS) ×4 IMPLANT
CATH ROBINSON RED A/P 18FR (CATHETERS) ×12 IMPLANT
CATH THORACIC 36FR RT ANG (CATHETERS) ×4 IMPLANT
CLIP TI WIDE RED SMALL 24 (CLIP) ×12 IMPLANT
COVER SURGICAL LIGHT HANDLE (MISCELLANEOUS) ×4 IMPLANT
CRADLE DONUT ADULT HEAD (MISCELLANEOUS) ×4 IMPLANT
DRAIN CHANNEL 32F RND 10.7 FF (WOUND CARE) ×4 IMPLANT
DRAPE CARDIOVASCULAR INCISE (DRAPES) ×2
DRAPE SLUSH/WARMER DISC (DRAPES) ×4 IMPLANT
DRAPE SRG 135X102X78XABS (DRAPES) ×2 IMPLANT
DRSG AQUACEL AG ADV 3.5X14 (GAUZE/BANDAGES/DRESSINGS) ×4 IMPLANT
ELECT BLADE 4.0 EZ CLEAN MEGAD (MISCELLANEOUS) ×4
ELECT BLADE 6.5 EXT (BLADE) ×4 IMPLANT
ELECT CAUTERY BLADE 6.4 (BLADE) ×4 IMPLANT
ELECT REM PT RETURN 9FT ADLT (ELECTROSURGICAL) ×8
ELECTRODE BLDE 4.0 EZ CLN MEGD (MISCELLANEOUS) ×2 IMPLANT
ELECTRODE REM PT RTRN 9FT ADLT (ELECTROSURGICAL) ×4 IMPLANT
GAUZE SPONGE 4X4 12PLY STRL (GAUZE/BANDAGES/DRESSINGS) ×8 IMPLANT
GLOVE BIO SURGEON STRL SZ 6 (GLOVE) ×8 IMPLANT
GLOVE BIO SURGEON STRL SZ 6.5 (GLOVE) ×6 IMPLANT
GLOVE BIO SURGEON STRL SZ7 (GLOVE) ×20 IMPLANT
GLOVE BIO SURGEON STRL SZ7.5 (GLOVE) ×12 IMPLANT
GLOVE BIO SURGEONS STRL SZ 6.5 (GLOVE) ×2
GLOVE BIOGEL PI IND STRL 6 (GLOVE) ×8 IMPLANT
GLOVE BIOGEL PI INDICATOR 6 (GLOVE) ×8
GOWN STRL REUS W/ TWL LRG LVL3 (GOWN DISPOSABLE) ×8 IMPLANT
GOWN STRL REUS W/TWL LRG LVL3 (GOWN DISPOSABLE) ×8
HEMOSTAT POWDER SURGIFOAM 1G (HEMOSTASIS) ×24 IMPLANT
HEMOSTAT SURGICEL 2X14 (HEMOSTASIS) ×4 IMPLANT
INSERT FOGARTY XLG (MISCELLANEOUS) IMPLANT
KIT BASIN OR (CUSTOM PROCEDURE TRAY) ×4 IMPLANT
KIT ROOM TURNOVER OR (KITS) ×4 IMPLANT
KIT SUCTION CATH 14FR (SUCTIONS) ×4 IMPLANT
KIT VASOVIEW W/TROCAR VH 2000 (KITS) ×4 IMPLANT
LEAD PACING MYOCARDI (MISCELLANEOUS) ×4 IMPLANT
MARKER GRAFT CORONARY BYPASS (MISCELLANEOUS) ×12 IMPLANT
NS IRRIG 1000ML POUR BTL (IV SOLUTION) ×20 IMPLANT
PACK OPEN HEART (CUSTOM PROCEDURE TRAY) ×4 IMPLANT
PAD ARMBOARD 7.5X6 YLW CONV (MISCELLANEOUS) ×8 IMPLANT
PAD ELECT DEFIB RADIOL ZOLL (MISCELLANEOUS) ×4 IMPLANT
PENCIL BUTTON HOLSTER BLD 10FT (ELECTRODE) ×4 IMPLANT
PUNCH AORTIC ROTATE  4.5MM 8IN (MISCELLANEOUS) ×4 IMPLANT
PUNCH AORTIC ROTATE 4.0MM (MISCELLANEOUS) IMPLANT
PUNCH AORTIC ROTATE 4.5MM 8IN (MISCELLANEOUS) IMPLANT
PUNCH AORTIC ROTATE 5MM 8IN (MISCELLANEOUS) IMPLANT
SET CARDIOPLEGIA MPS 5001102 (MISCELLANEOUS) ×4 IMPLANT
SOLUTION ANTI FOG 6CC (MISCELLANEOUS) ×4 IMPLANT
SPONGE GAUZE 4X4 12PLY STER LF (GAUZE/BANDAGES/DRESSINGS) ×8 IMPLANT
SPONGE LAP 18X18 X RAY DECT (DISPOSABLE) ×8 IMPLANT
SPONGE LAP 4X18 X RAY DECT (DISPOSABLE) ×4 IMPLANT
SURGIFLO W/THROMBIN 8M KIT (HEMOSTASIS) ×8 IMPLANT
SUT BONE WAX W31G (SUTURE) ×4 IMPLANT
SUT MNCRL AB 4-0 PS2 18 (SUTURE) ×4 IMPLANT
SUT PROLENE 3 0 SH DA (SUTURE) ×28 IMPLANT
SUT PROLENE 3 0 SH1 36 (SUTURE) IMPLANT
SUT PROLENE 4 0 RB 1 (SUTURE) ×2
SUT PROLENE 4 0 SH DA (SUTURE) ×4 IMPLANT
SUT PROLENE 4-0 RB1 .5 CRCL 36 (SUTURE) ×2 IMPLANT
SUT PROLENE 5 0 C 1 36 (SUTURE) IMPLANT
SUT PROLENE 6 0 C 1 30 (SUTURE) ×8 IMPLANT
SUT PROLENE 6 0 CC (SUTURE) ×12 IMPLANT
SUT PROLENE 8 0 BV175 6 (SUTURE) ×16 IMPLANT
SUT PROLENE BLUE 7 0 (SUTURE) ×8 IMPLANT
SUT SILK  1 MH (SUTURE)
SUT SILK 1 MH (SUTURE) IMPLANT
SUT SILK 2 0 SH CR/8 (SUTURE) ×4 IMPLANT
SUT SILK 3 0 SH CR/8 (SUTURE) IMPLANT
SUT STEEL 6MS V (SUTURE) ×8 IMPLANT
SUT STEEL STERNAL CCS#1 18IN (SUTURE) ×8 IMPLANT
SUT STEEL SZ 6 DBL 3X14 BALL (SUTURE) ×8 IMPLANT
SUT VIC AB 1 CTX 36 (SUTURE) ×6
SUT VIC AB 1 CTX36XBRD ANBCTR (SUTURE) ×6 IMPLANT
SUT VIC AB 2-0 CT1 27 (SUTURE) ×2
SUT VIC AB 2-0 CT1 TAPERPNT 27 (SUTURE) ×2 IMPLANT
SUT VIC AB 2-0 CTX 27 (SUTURE) IMPLANT
SUT VIC AB 3-0 X1 27 (SUTURE) IMPLANT
SUTURE E-PAK OPEN HEART (SUTURE) ×4 IMPLANT
SYSTEM SAHARA CHEST DRAIN ATS (WOUND CARE) ×4 IMPLANT
TAPE CLOTH SURG 4X10 WHT LF (GAUZE/BANDAGES/DRESSINGS) ×4 IMPLANT
TOWEL OR 17X24 6PK STRL BLUE (TOWEL DISPOSABLE) ×8 IMPLANT
TOWEL OR 17X26 10 PK STRL BLUE (TOWEL DISPOSABLE) ×8 IMPLANT
TRAY FOLEY IC TEMP SENS 16FR (CATHETERS) ×4 IMPLANT
TUBING INSUFFLATION (TUBING) ×4 IMPLANT
UNDERPAD 30X30 INCONTINENT (UNDERPADS AND DIAPERS) ×4 IMPLANT
WATER STERILE IRR 1000ML POUR (IV SOLUTION) ×8 IMPLANT

## 2016-01-20 NOTE — Transfer of Care (Signed)
Immediate Anesthesia Transfer of Care Note  Patient: Austin Arnold  Procedure(s) Performed: Procedure(s): CORONARY ARTERY BYPASS GRAFTING (CABG) times four using left internal mammary artery and right saphenous vein (N/A) TRANSESOPHAGEAL ECHOCARDIOGRAM (TEE) (N/A)  Patient Location: ICU  Anesthesia Type:General  Level of Consciousness: sedated and Patient remains intubated per anesthesia plan  Airway & Oxygen Therapy: Patient remains intubated per anesthesia plan and Patient placed on Ventilator (see vital sign flow sheet for setting)  Post-op Assessment: Report given to RN and Post -op Vital signs reviewed and stable  Post vital signs: Reviewed and stable  Last Vitals:  Filed Vitals:   01/19/16 2200 01/20/16 0528  BP:  113/62  Pulse: 55 59  Temp:  36.4 C  Resp:  18    Complications: No apparent anesthesia complications

## 2016-01-20 NOTE — OR Nursing (Signed)
1st call to SICU 1258.

## 2016-01-20 NOTE — Progress Notes (Signed)
      [address]       [place] [address]             [phone]       PM rounds  Just extubated, resting quietly  BP 113/69 mmHg  Pulse 92  Temp(Src) 99.1 F (37.3 C) (Oral)  Resp 14  Ht 6\' 2"  (1.88 m)  Wt 249 lb (112.946 kg)  BMI 31.96 kg/m2  SpO2 98%  CI= 2.77   Intake/Output Summary (Last 24 hours) at 01/20/16 1742 Last data filed at 01/20/16 1700  Gross per 24 hour  Intake 4548.3 ml  Output   2540 ml  Net 2008.3 ml    Doing well early postop  2009 C. Viviann Spare, MD [facility] [phone]

## 2016-01-20 NOTE — Progress Notes (Signed)
  Echocardiogram Echocardiogram Transesophageal has been performed.  Austin Arnold 01/20/2016, 8:48 AM

## 2016-01-20 NOTE — Brief Op Note (Signed)
01/12/2016 - 01/20/2016  12:13 PM  PATIENT:  Austin Arnold  50 y.o. male  PRE-OPERATIVE DIAGNOSIS:  CAD  POST-OPERATIVE DIAGNOSIS:  CAD  PROCEDURE:  Procedure(s):  CORONARY ARTERY BYPASS GRAFTING x 4 -LIMA to LAD -SVG to RAMUS -SEQ SVG to PL and OM  ENDOSCOPIC HARVEST GREATER SAPHENOUS VEIN -Right Leg  TRANSESOPHAGEAL ECHOCARDIOGRAM (TEE) (N/A)  SURGEON:  Surgeon(s) and Role:    * Kerin Perna, MD - Primary  PHYSICIAN ASSISTANT: Lowella Dandy PA-C  ANESTHESIA:   general  EBL:  Total I/O In: 1600 [I.V.:1600] Out: 350 [Urine:350]  BLOOD ADMINISTERED: CELLSAVER  DRAINS: Left Pleural Chest Tube, Mediastinal Chest Drains   LOCAL MEDICATIONS USED:  NONE  SPECIMEN:  No Specimen  DISPOSITION OF SPECIMEN:  N/A  COUNTS:  YES  TOURNIQUET:  * No tourniquets in log *  DICTATION: .Dragon Dictation  PLAN OF CARE: Admit to inpatient   PATIENT DISPOSITION:  ICU - intubated and hemodynamically stable.   Delay start of Pharmacological VTE agent (>24hrs) due to surgical blood loss or risk of bleeding: yes

## 2016-01-20 NOTE — Procedures (Signed)
Extubation Procedure Note  Patient Details:   Name: Austin Arnold DOB: January 22, 1966 MRN: [id number]   Airway Documentation:     Evaluation  O2 sats: stable throughout and currently acceptable Complications: No apparent complications Patient did tolerate procedure well. Bilateral Breath Sounds: Clear   Yes  Antoine Poche 01/20/2016, 4:37 PM

## 2016-01-20 NOTE — Anesthesia Postprocedure Evaluation (Signed)
Anesthesia Post Note  Patient: Conservator, museum/gallery  Procedure(s) Performed: Procedure(s) (LRB): CORONARY ARTERY BYPASS GRAFTING (CABG) times four using left internal mammary artery and right saphenous vein (N/A) TRANSESOPHAGEAL ECHOCARDIOGRAM (TEE) (N/A)  Patient location during evaluation: SICU Anesthesia Type: General Level of consciousness: sedated Pain management: pain level controlled Vital Signs Assessment: post-procedure vital signs reviewed and stable Respiratory status: patient remains intubated per anesthesia plan Cardiovascular status: stable Anesthetic complications: no Comments: Bilateral breath sounds, CXR pending, BP stable on dopamine@3 , precedex infusion  B Arria Naim    Last Vitals:  Filed Vitals:   01/19/16 2200 01/20/16 0528  BP:  113/62  Pulse: 55 59  Temp:  36.4 C  Resp:  18    Last Pain:  Filed Vitals:   01/20/16 0650  PainSc: 0-No pain                 Reino Kent

## 2016-01-20 NOTE — Progress Notes (Signed)
The patient was examined and preop studies reviewed. There has been no change from the prior exam and the patient is ready for surgery.  plan CABG on [address] [place]

## 2016-01-20 NOTE — Anesthesia Preprocedure Evaluation (Signed)
Anesthesia Evaluation  Patient identified by MRN, date of birth, ID band Patient awake    Reviewed: Allergy & Precautions, H&P , NPO status , Patient's Chart, lab work & pertinent test results  History of Anesthesia Complications Negative for: history of anesthetic complications  Airway Mallampati: II  TM Distance: >3 FB Neck ROM: full    Dental no notable dental hx.    Pulmonary sleep apnea , Current Smoker,    Pulmonary exam normal breath sounds clear to auscultation       Cardiovascular + CAD and + Past MI  Normal cardiovascular exam Rhythm:regular Rate:Normal  12/2015 Echo with preserved EF 60%, diastolic dysfunction, no sig valve abnormalities   Neuro/Psych PSYCHIATRIC DISORDERS Bipolar Disorder negative neurological ROS     GI/Hepatic Neg liver ROS, GERD  ,  Endo/Other  diabetes, Poorly Controlled, Type 2, Insulin Dependent  Renal/GU negative Renal ROS     Musculoskeletal  (+) Arthritis , Rheumatoid disorders,    Abdominal   Peds  Hematology negative hematology ROS (+)   Anesthesia Other Findings Questionable RA, has been on prednisone, given high stress surgery will provide stress dose steroid with 100mg  hydrocortisone IV    Reproductive/Obstetrics negative OB ROS                             Anesthesia Physical Anesthesia Plan  ASA: IV  Anesthesia Plan: General   Post-op Pain Management:    Induction: Intravenous  Airway Management Planned: Oral ETT  Additional Equipment: Arterial line, CVP, TEE, PA Cath and Ultrasound Guidance Line Placement  Intra-op Plan:   Post-operative Plan: Post-operative intubation/ventilation  Informed Consent: I have reviewed the patients History and Physical, chart, labs and discussed the procedure including the risks, benefits and alternatives for the proposed anesthesia with the patient or authorized representative who has indicated  his/her understanding and acceptance.   Dental Advisory Given  Plan Discussed with: Anesthesiologist, CRNA and Surgeon  Anesthesia Plan Comments:         Anesthesia Quick Evaluation

## 2016-01-20 NOTE — OR Nursing (Signed)
2nd call to SICU 1354.

## 2016-01-20 NOTE — Anesthesia Procedure Notes (Addendum)
Central Venous Catheter Insertion Performed by: anesthesiologist Patient location: Pre-op. Preanesthetic checklist: patient identified, IV checked, site marked, risks and benefits discussed, surgical consent, monitors and equipment checked, pre-op evaluation, timeout performed and anesthesia consent Lidocaine 1% used for infiltration Landmarks identified Catheter size: 8.5 Fr Central line was placed.Single lumen Swan type and PA catheter depth:52PA Cath depth:52 Procedure performed using ultrasound guided technique. Attempts: 1 Following insertion, line sutured and dressing applied. Post procedure assessment: blood return through all ports, free fluid flow and no air. Patient tolerated the procedure well with no immediate complications.    Central Venous Catheter Insertion Performed by: anesthesiologist Patient location: Pre-op. Preanesthetic checklist: patient identified, IV checked, site marked, risks and benefits discussed, surgical consent, monitors and equipment checked, pre-op evaluation, timeout performed and anesthesia consent Landmarks identified PA cath was placed.Swan type and PA catheter depth:thermodilation and 52PA Cath depth:52 Procedure performed using ultrasound guided technique. Attempts: 1 Patient tolerated the procedure well with no immediate complications.    Procedure Name: Intubation Date/Time: 01/20/2016 8:08 AM Performed by: Adonis Housekeeper Pre-anesthesia Checklist: Patient identified, Emergency Drugs available, Suction available and Patient being monitored Patient Re-evaluated:Patient Re-evaluated prior to inductionOxygen Delivery Method: Circle system utilized Preoxygenation: Pre-oxygenation with 100% oxygen Intubation Type: IV induction Ventilation: Mask ventilation without difficulty Laryngoscope Size: Mac and 4 Grade View: Grade II Tube type: Subglottic suction tube Tube size: 8.0 mm Number of attempts: 1 Airway Equipment and Method:  Stylet Placement Confirmation: ETT inserted through vocal cords under direct vision,  positive ETCO2 and breath sounds checked- equal and bilateral Secured at: 25 cm Tube secured with: Tape Dental Injury: Teeth and Oropharynx as per pre-operative assessment

## 2016-01-21 ENCOUNTER — Encounter (HOSPITAL_COMMUNITY): Payer: Self-pay | Admitting: Cardiothoracic Surgery

## 2016-01-21 ENCOUNTER — Inpatient Hospital Stay (HOSPITAL_COMMUNITY): Payer: Medicare Other

## 2016-01-21 LAB — GLUCOSE, CAPILLARY
GLUCOSE-CAPILLARY: 104 mg/dL — AB (ref 65–99)
GLUCOSE-CAPILLARY: 116 mg/dL — AB (ref 65–99)
GLUCOSE-CAPILLARY: 117 mg/dL — AB (ref 65–99)
GLUCOSE-CAPILLARY: 121 mg/dL — AB (ref 65–99)
GLUCOSE-CAPILLARY: 121 mg/dL — AB (ref 65–99)
GLUCOSE-CAPILLARY: 123 mg/dL — AB (ref 65–99)
GLUCOSE-CAPILLARY: 127 mg/dL — AB (ref 65–99)
GLUCOSE-CAPILLARY: 138 mg/dL — AB (ref 65–99)
GLUCOSE-CAPILLARY: 140 mg/dL — AB (ref 65–99)
GLUCOSE-CAPILLARY: 140 mg/dL — AB (ref 65–99)
GLUCOSE-CAPILLARY: 144 mg/dL — AB (ref 65–99)
GLUCOSE-CAPILLARY: 149 mg/dL — AB (ref 65–99)
GLUCOSE-CAPILLARY: 231 mg/dL — AB (ref 65–99)
GLUCOSE-CAPILLARY: 99 mg/dL (ref 65–99)
Glucose-Capillary: 124 mg/dL — ABNORMAL HIGH (ref 65–99)
Glucose-Capillary: 122 mg/dL — ABNORMAL HIGH (ref 65–99)
Glucose-Capillary: 136 mg/dL — ABNORMAL HIGH (ref 65–99)
Glucose-Capillary: 111 mg/dL — ABNORMAL HIGH (ref 65–99)
Glucose-Capillary: 128 mg/dL — ABNORMAL HIGH (ref 65–99)
Glucose-Capillary: 118 mg/dL — ABNORMAL HIGH (ref 65–99)
Glucose-Capillary: 145 mg/dL — ABNORMAL HIGH (ref 65–99)
Glucose-Capillary: 196 mg/dL — ABNORMAL HIGH (ref 65–99)
Glucose-Capillary: 200 mg/dL — ABNORMAL HIGH (ref 65–99)

## 2016-01-21 LAB — PREPARE FRESH FROZEN PLASMA
UNIT DIVISION: 0
Unit division: 0

## 2016-01-21 LAB — BASIC METABOLIC PANEL
ANION GAP: 8 (ref 5–15)
BUN: 15 mg/dL (ref 6–20)
CHLORIDE: 106 mmol/L (ref 101–111)
CO2: 23 mmol/L (ref 22–32)
Calcium: 8.4 mg/dL — ABNORMAL LOW (ref 8.9–10.3)
Creatinine, Ser: 1.08 mg/dL (ref 0.61–1.24)
GFR calc Af Amer: >60 mL/min (ref >60)
GFR calc non Af Amer: >60 mL/min (ref >60)
Glucose, Bld: 125 mg/dL — ABNORMAL HIGH (ref 65–99)
POTASSIUM: 4.4 mmol/L (ref 3.5–5.1)
SODIUM: 137 mmol/L (ref 135–145)

## 2016-01-21 LAB — POCT I-STAT 3, ART BLOOD GAS (G3+)
ACID-BASE DEFICIT: 3 mmol/L — AB (ref 0.0–2.0)
BICARBONATE: 22.8 meq/L (ref 20.0–24.0)
O2 Saturation: 95 %
TCO2: 24 mmol/L (ref 0–100)
pCO2 arterial: 44.9 mmHg (ref 35.0–45.0)
pH, Arterial: 7.318 — ABNORMAL LOW (ref 7.350–7.450)
pO2, Arterial: 86 mmHg (ref 80.0–100.0)

## 2016-01-21 LAB — TYPE AND SCREEN
ABO/RH(D): O POS
Antibody Screen: NEGATIVE
Unit division: 0
Unit division: 0

## 2016-01-21 LAB — CBC
HCT: 38.7 % — ABNORMAL LOW (ref 39.0–52.0)
HEMATOCRIT: 38.5 % — AB (ref 39.0–52.0)
HEMOGLOBIN: 13.3 g/dL (ref 13.0–17.0)
HEMOGLOBIN: 13 g/dL (ref 13.0–17.0)
MCH: 30.6 pg (ref 26.0–34.0)
MCH: 30.4 pg (ref 26.0–34.0)
MCHC: 34.4 g/dL (ref 30.0–36.0)
MCHC: 33.8 g/dL (ref 30.0–36.0)
MCV: 89 fL (ref 78.0–100.0)
MCV: 90 fL (ref 78.0–100.0)
Platelets: 119 10*3/uL — ABNORMAL LOW (ref 150–400)
Platelets: 99 10*3/uL — ABNORMAL LOW (ref 150–400)
RBC: 4.35 MIL/uL (ref 4.22–5.81)
RBC: 4.28 MIL/uL (ref 4.22–5.81)
RDW: 13.3 % (ref 11.5–15.5)
RDW: 13.6 % (ref 11.5–15.5)
WBC: 15.1 10*3/uL — AB (ref 4.0–10.5)
WBC: 13.5 10*3/uL — ABNORMAL HIGH (ref 4.0–10.5)

## 2016-01-21 LAB — POCT I-STAT, CHEM 8
BUN: 18 mg/dL (ref 6–20)
CALCIUM ION: 1.25 mmol/L — AB (ref 1.12–1.23)
CHLORIDE: 98 mmol/L — AB (ref 101–111)
CREATININE: 1 mg/dL (ref 0.61–1.24)
GLUCOSE: 238 mg/dL — AB (ref 65–99)
HCT: 41 % (ref 39.0–52.0)
Hemoglobin: 13.9 g/dL (ref 13.0–17.0)
POTASSIUM: 4.1 mmol/L (ref 3.5–5.1)
Sodium: 134 mmol/L — ABNORMAL LOW (ref 135–145)
TCO2: 21 mmol/L (ref 0–100)

## 2016-01-21 LAB — CREATININE, SERUM
Creatinine, Ser: 1.11 mg/dL (ref 0.61–1.24)
GFR calc Af Amer: >60 mL/min (ref >60)
GFR calc non Af Amer: >60 mL/min (ref >60)

## 2016-01-21 LAB — MAGNESIUM
MAGNESIUM: 2.2 mg/dL (ref 1.7–2.4)
Magnesium: 1.9 mg/dL (ref 1.7–2.4)

## 2016-01-21 LAB — HEMOGLOBIN A1C
HEMOGLOBIN A1C: 8 % — AB (ref 4.8–5.6)
MEAN PLASMA GLUCOSE: 183 mg/dL

## 2016-01-21 MED ORDER — PHENYLEPHRINE HCL 10 MG/ML IJ SOLN
0.0000 ug/min | INTRAMUSCULAR | Status: DC
  Filled 2016-01-21: qty 2

## 2016-01-21 MED ORDER — INSULIN REGULAR BOLUS VIA INFUSION
5.0000 [IU] | Freq: Three times a day (TID) | INTRAVENOUS | Status: DC
  Filled 2016-01-21: qty 5

## 2016-01-21 MED ORDER — INSULIN DETEMIR 100 UNIT/ML ~~LOC~~ SOLN
18.0000 [IU] | Freq: Two times a day (BID) | SUBCUTANEOUS | Status: DC
  Administered 2016-01-21: 18 [IU] via SUBCUTANEOUS
  Filled 2016-01-21 (×2): qty 0.18

## 2016-01-21 MED ORDER — INSULIN DETEMIR 100 UNIT/ML ~~LOC~~ SOLN
25.0000 [IU] | Freq: Two times a day (BID) | SUBCUTANEOUS | Status: DC
  Administered 2016-01-21 – 2016-01-27 (×12): 25 [IU] via SUBCUTANEOUS
  Filled 2016-01-21 (×15): qty 0.25

## 2016-01-21 MED ORDER — METOCLOPRAMIDE HCL 5 MG/ML IJ SOLN
10.0000 mg | Freq: Four times a day (QID) | INTRAMUSCULAR | Status: AC
  Administered 2016-01-21 – 2016-01-22 (×4): 10 mg via INTRAVENOUS
  Filled 2016-01-21 (×4): qty 2

## 2016-01-21 MED ORDER — FUROSEMIDE 10 MG/ML IJ SOLN
20.0000 mg | Freq: Two times a day (BID) | INTRAMUSCULAR | Status: DC
  Administered 2016-01-21 – 2016-01-22 (×3): 20 mg via INTRAVENOUS
  Filled 2016-01-21 (×3): qty 2

## 2016-01-21 MED ORDER — INSULIN ASPART 100 UNIT/ML ~~LOC~~ SOLN
0.0000 [IU] | SUBCUTANEOUS | Status: DC
  Administered 2016-01-21 (×2): 4 [IU] via SUBCUTANEOUS
  Administered 2016-01-21 – 2016-01-22 (×2): 8 [IU] via SUBCUTANEOUS
  Administered 2016-01-22: 4 [IU] via SUBCUTANEOUS
  Administered 2016-01-22: 8 [IU] via SUBCUTANEOUS

## 2016-01-21 MED ORDER — AMIODARONE HCL 200 MG PO TABS
400.0000 mg | ORAL_TABLET | Freq: Two times a day (BID) | ORAL | Status: DC
  Administered 2016-01-21 – 2016-01-22 (×4): 400 mg via ORAL
  Filled 2016-01-21 (×4): qty 2

## 2016-01-21 MED ORDER — KETOROLAC TROMETHAMINE 15 MG/ML IJ SOLN
15.0000 mg | Freq: Four times a day (QID) | INTRAMUSCULAR | Status: DC
  Administered 2016-01-21 – 2016-01-22 (×4): 15 mg via INTRAVENOUS
  Filled 2016-01-21 (×4): qty 1

## 2016-01-21 MED ORDER — ALPRAZOLAM 0.5 MG PO TABS
0.5000 mg | ORAL_TABLET | Freq: Two times a day (BID) | ORAL | Status: DC
  Administered 2016-01-21 – 2016-01-25 (×9): 0.5 mg via ORAL
  Filled 2016-01-21 (×9): qty 1

## 2016-01-21 NOTE — Op Note (Signed)
NAMEJAJA, SWITALSKI NO.:  [id number]  MEDICAL RECORD NO.:  [id number]  LOCATION:                               FACILITY:  [facility]  PHYSICIAN:  Kerin Perna, M.D.  DATE OF BIRTH:  1966/03/17  DATE OF PROCEDURE: DATE OF DISCHARGE:                              OPERATIVE REPORT   OPERATION: 1. Coronary artery bypass grafting x4 (left internal mammary artery to     left anterior descending coronary artery, saphenous vein graft to     ramus intermediate, sequential saphenous vein graft to obtuse     marginal continuing to the posterior lateral branch of the distal     circumflex). 2. Endoscopic harvest of right leg greater saphenous vein.  PREOPERATIVE DIAGNOSES:  Unstable angina, non-ST elevation myocardial infarction with severe 3-vessel coronary disease, and moderate LV dysfunction.  POSTOPERATIVE DIAGNOSES:  Unstable angina, non-ST elevation myocardial infarction with severe 3-vessel coronary disease, and moderate LV dysfunction.  SURGEON:  Kerin Perna, MD  ASSISTANT:  Lowella Dandy, PA-C  ANESTHESIA:  General.  INDICATIONS:  The patient is a 50 year old, diabetic reformed smoker who has poorly controlled diabetes and presented with non ST elevation MI. Previously, he had a PCI several years ago.  He was admitted to the hospital and underwent urgent cardiac catheterization which demonstrated severe 2-vessel coronary disease with a nondominant small right coronary.  There was moderate LV dysfunction.  It was felt that surgical revascularization would be his best long-term therapy with severe multivessel disease and reduced LV function.  I examined the patient in his hospital room and reviewed the results of the cardiac catheterization with the patient and his wife.  I discussed the indications and expected benefits of coronary artery bypass surgery for treatment of his coronary artery disease.  I reviewed the alternatives to surgical therapy as  well.  I discussed with the patient the risks to him of heart bypass surgery including risks of stroke, MI, bleeding, blood transfusion requirement; postoperative pulmonary problems including pleural effusion, postoperative wound infection problems related to his poor diabetic control, and postoperative arrhythmias and/or death.  After reviewing these issues, he demonstrated his understanding and agreed to proceed with surgery under what I felt was an informed consent.  OPERATIVE FINDINGS: 1. The right coronary was too small to graft. 2. Heavily diseased coronary suboptimal targets for grafting. 3. Adequate conduit. 4. Improved global LV function after surgical revascularization after     separation and cardiopulmonary bypass.  OPERATIVE PROCEDURE:  The patient was brought to the operating room, placed supine on the operating table.  General anesthesia was induced. The transesophageal echo probe was placed by the anesthesiologist.  The chest, abdomen, and legs were prepped with Betadine and draped as a sterile field.  A sternal incision was made.  The saphenous vein was harvested endoscopically from the right leg.  The left internal mammary artery was harvested as a pedicle graft from its origin at the subclavian vessels.  It was 1.5-mm vessel with adequate flow.  The sternal retractor was replaced and the pericardium opened and suspended. Pursestrings were placed in the ascending aorta and right atrium.  After heparin was administered and the ACT was  documented as being therapeutic, the patient was cannulated and placed on cardiopulmonary bypass.  The cornua were identified for grafting.  The LAD was intramyocardial, but was dissected out in the mid septal area as an acceptable target.  It was felt that the left IMA graft to the LAD would provide accurate fusions of the diagonal as well.  The ramus intermediate was a small vessel, but graftable.  He had an 80% stenosis. The LAD had  a 95% stenosis.  The OM1 had a 90% stenosis and was an adequate target.  Posterior branch of the distal circumflex with a smaller 1.2-mm vessel, but graftable.  The mammary artery and vein grafts were prepared for the distal anastomoses and cardioplegia cannulas were placed both antegrade and retrograde cold blood cardioplegia.  The patient was cooled to 32 degrees and an aortic crossclamp was applied.  One liter of cold blood cardioplegia was delivered in split doses between the antegrade aortic and retrograde coronary sinus catheters.  There was good cardioplegic arrest and septal temperature dropped less than 12 degrees.  Cardioplegia was delivered every 20 minutes or less.  The distal coronary anastomoses were performed.  The first distal anastomosis was the sequential vein graft to the OM continuing at the PL.  The OM was a 1.5-mm vessel proximal 90% stenosis.  A side-to-side anastomosis with the vein was constructed using running 7-0 Prolene, a good flow through the graft.  The sequential graft was then continued to the posterior lateral branch of the distal circumflex.  This was a smaller 1.2-mm vessel and the end of the vein was sewn end-to-side with running 7-0 Prolene with good flow through the graft.  Cardioplegia was then redosed through the vein graft.  The third distal anastomosis was to the ramus intermediate branch of the left coronary.  There was a 1.5-mm vessel proximal 80% stenosis.  A reverse saphenous vein was sewn end-to-side with running 7-0 Prolene. There was good flow through the graft.  Cardioplegia was redosed.  Attention was then directed to the LAD.  The LAD was opened.  It had deoxygenated blood, but had the wall characteristics of an artery.  I probed the opening distally and the probe followed the path of the intramyocardial vessel to the exposed area of the LAD at the apex confirming that this was the LAD.  I also opened the diagonal 1 mm and passed a  probe proximally to demonstrate and confirm that the diagonal was a branch of the LAD.  The small opening in the LAD was then closed with some interrupted 8-0 Prolenes.  The left IMA pedicle was brought through an opening in the lateral pericardium and brought down onto the LAD and sewn end-to-side with running 8-0 Prolene.  There was good flow through the anastomosis after briefly releasing the pedicle bulldog in the mammary artery.  The bulldog was reapplied and the pedicle secured to the epicardium with 6-0 Prolenes.  Cardioplegia was redosed.  The crossclamp was still in place, 2 proximal vein anastomoses were performed on the ascending aorta using a 4.5 mm punch and running 6-0 Prolene.  Prior to removing the crossclamp, air was vented from the coronaries with a dose of retrograde and warm blood cardioplegia.  The crossclamp was removed.  The heart was cardioverted back to a regular rhythm.  The vein grafts were de-aired and opened, each had good flow.  Hemostasis was documented at the proximal and distal anastomoses.  The patient was rewarmed and reperfused.  Temporary pacing   wires were applied.  Low-dose milrinone and dopamine were started.  The lungs were expanded and ventilator was resumed.  The patient was then weaned off cardiopulmonary bypass without difficulty.  Hemodynamics were stable.  Echo continued to improve and showed significant improved global LV function.  Protamine was administered without adverse reaction.  The cannulas were removed.  The mediastinum was irrigated.  The superior pericardium was closed over the aorta and vein grafts.  Anterior mediastinal and left pleural chest tube were placed and brought out through separate incisions.  The sternum was closed with wire.  The pectoralis fascia was closed with running #1 Vicryl.  Subcutaneous and skin layers were closed with running 0 Vicryl and sterile dressings were applied.  Total cardiopulmonary bypass time  was 141 minutes.  The patient returned to the ICU in stable condition.     Kerin Perna, M.D.   ______________________________ Kerin Perna, M.D.    PV/MEDQ  D:  01/21/2016  T:  01/21/2016  Job:  [id number]  cc:   Lyn Records, M.D.

## 2016-01-21 NOTE — Care Management Note (Signed)
Case Management Note Donn Pierini RN, BSN Unit 2W-Case Manager [phone]  Patient Details  Name: Austin Arnold MRN: [id number] Date of Birth: 28-Apr-1966  Subjective/Objective:    Pt admitted with NSTEMI, cath showed severe multi vessel dx- plan for CABG possibly Thur 01/20/16              Updated Action Plan:  Pt plans to return home with wife post discharge, wife will provide recommended supervision post discharge.  CM will continue to monitor for disposition needs  Action/Plan: PTA pt lived at home with spouse- anticipate return home with spouse- CM will follow post op for pt progression and d/c needs  Expected Discharge Date:                  Expected Discharge Plan:  Home/Self Care  In-House Referral:     Discharge planning Services  CM Consult  Post Acute Care Choice:    Choice offered to:     DME Arranged:    DME Agency:     HH Arranged:    HH Agency:     Status of Service:  In process, will continue to follow  Medicare Important Message Given:  Yes Date Medicare IM Given:    Medicare IM give by:    Date Additional Medicare IM Given:    Additional Medicare Important Message give by:     If discussed at Long Length of Stay Meetings, dates discussed:  01/18/16  Additional Comments:  Cherylann Parr, RN 01/21/2016, 3:55 PM

## 2016-01-21 NOTE — Op Note (Deleted)
Austin Arnold, Austin Arnold NO.:  [id number]  MEDICAL RECORD NO.:  [id number]  LOCATION:                               FACILITY:  [facility]  PHYSICIAN:  Kerin Perna, M.D.  DATE OF BIRTH:  1966/03/17  DATE OF PROCEDURE: DATE OF DISCHARGE:                              OPERATIVE REPORT   OPERATION: 1. Coronary artery bypass grafting x4 (left internal mammary artery to     left anterior descending coronary artery, saphenous vein graft to     ramus intermediate, sequential saphenous vein graft to obtuse     marginal continuing to the posterior lateral branch of the distal     circumflex). 2. Endoscopic harvest of right leg greater saphenous vein.  PREOPERATIVE DIAGNOSES:  Unstable angina, non-ST elevation myocardial infarction with severe 3-vessel coronary disease, and moderate LV dysfunction.  POSTOPERATIVE DIAGNOSES:  Unstable angina, non-ST elevation myocardial infarction with severe 3-vessel coronary disease, and moderate LV dysfunction.  SURGEON:  Kerin Perna, MD  ASSISTANT:  Lowella Dandy, PA-C  ANESTHESIA:  General.  INDICATIONS:  The patient is a 50 year old, diabetic reformed smoker who has poorly controlled diabetes and presented with non ST elevation MI. Previously, he had a PCI several years ago.  He was admitted to the hospital and underwent urgent cardiac catheterization which demonstrated severe 2-vessel coronary disease with a nondominant small right coronary.  There was moderate LV dysfunction.  It was felt that surgical revascularization would be his best long-term therapy with severe multivessel disease and reduced LV function.  I examined the patient in his hospital room and reviewed the results of the cardiac catheterization with the patient and his wife.  I discussed the indications and expected benefits of coronary artery bypass surgery for treatment of his coronary artery disease.  I reviewed the alternatives to surgical therapy as  well.  I discussed with the patient the risks to him of heart bypass surgery including risks of stroke, MI, bleeding, blood transfusion requirement; postoperative pulmonary problems including pleural effusion, postoperative wound infection problems related to his poor diabetic control, and postoperative arrhythmias and/or death.  After reviewing these issues, he demonstrated his understanding and agreed to proceed with surgery under what I felt was an informed consent.  OPERATIVE FINDINGS: 1. The right coronary was too small to graft. 2. Heavily diseased coronary suboptimal targets for grafting. 3. Adequate conduit. 4. Improved global LV function after surgical revascularization after     separation and cardiopulmonary bypass.  OPERATIVE PROCEDURE:  The patient was brought to the operating room, placed supine on the operating table.  General anesthesia was induced. The transesophageal echo probe was placed by the anesthesiologist.  The chest, abdomen, and legs were prepped with Betadine and draped as a sterile field.  A sternal incision was made.  The saphenous vein was harvested endoscopically from the right leg.  The left internal mammary artery was harvested as a pedicle graft from its origin at the subclavian vessels.  It was 1.5-mm vessel with adequate flow.  The sternal retractor was replaced and the pericardium opened and suspended. Pursestrings were placed in the ascending aorta and right atrium.  After heparin was administered and the ACT was  documented as being therapeutic, the patient was cannulated and placed on cardiopulmonary bypass.  The cornua were identified for grafting.  The LAD was intramyocardial, but was dissected out in the mid septal area as an acceptable target.  It was felt that the left IMA graft to the LAD would provide accurate fusions of the diagonal as well.  The ramus intermediate was a small vessel, but graftable.  He had an 80% stenosis. The LAD had  a 95% stenosis.  The OM1 had a 90% stenosis and was an adequate target.  Posterior branch of the distal circumflex with a smaller 1.2-mm vessel, but graftable.  The mammary artery and vein grafts were prepared for the distal anastomoses and cardioplegia cannulas were placed both antegrade and retrograde cold blood cardioplegia.  The patient was cooled to 32 degrees and an aortic crossclamp was applied.  One liter of cold blood cardioplegia was delivered in split doses between the antegrade aortic and retrograde coronary sinus catheters.  There was good cardioplegic arrest and septal temperature dropped less than 12 degrees.  Cardioplegia was delivered every 20 minutes or less.  The distal coronary anastomoses were performed.  The first distal anastomosis was the sequential vein graft to the OM continuing at the PL.  The OM was a 1.5-mm vessel proximal 90% stenosis.  A side-to-side anastomosis with the vein was constructed using running 7-0 Prolene, a good flow through the graft.  The sequential graft was then continued to the posterior lateral branch of the distal circumflex.  This was a smaller 1.2-mm vessel and the end of the vein was sewn end-to-side with running 7-0 Prolene with good flow through the graft.  Cardioplegia was then redosed through the vein graft.  The third distal anastomosis was to the ramus intermediate branch of the left coronary.  There was a 1.5-mm vessel proximal 80% stenosis.  A reverse saphenous vein was sewn end-to-side with running 7-0 Prolene. There was good flow through the graft.  Cardioplegia was redosed.  Attention was then directed to the LAD.  The LAD was opened.  It had deoxygenated blood, but had the wall characteristics of an artery.  I probed the opening distally and the probe followed the path of the intramyocardial vessel to the exposed area of the LAD at the apex confirming that this was the LAD.  I also opened the diagonal 1 mm and passed a  probe proximally to demonstrate and confirm that the diagonal was a branch of the LAD.  The small opening in the LAD was then closed with some interrupted 8-0 Prolenes.  The left IMA pedicle was brought through an opening in the lateral pericardium and brought down onto the LAD and sewn end-to-side with running 8-0 Prolene.  There was good flow through the anastomosis after briefly releasing the pedicle bulldog in the mammary artery.  The bulldog was reapplied and the pedicle secured to the epicardium with 6-0 Prolenes.  Cardioplegia was redosed.  The crossclamp was still in place, 2 proximal vein anastomoses were performed on the ascending aorta using a 4.5 mm punch and running 6-0 Prolene.  Prior to removing the crossclamp, air was vented from the coronaries with a dose of retrograde and warm blood cardioplegia.  The crossclamp was removed.  The heart was cardioverted back to a regular rhythm.  The vein grafts were de-aired and opened, each had good flow.  Hemostasis was documented at the proximal and distal anastomoses.  The patient was rewarmed and reperfused.  Temporary pacing   wires were applied.  Low-dose milrinone and dopamine were started.  The lungs were expanded and ventilator was resumed.  The patient was then weaned off cardiopulmonary bypass without difficulty.  Hemodynamics were stable.  Echo continued to improve and showed significant improved global LV function.  Protamine was administered without adverse reaction.  The cannulas were removed.  The mediastinum was irrigated.  The superior pericardium was closed over the aorta and vein grafts.  Anterior mediastinal and left pleural chest tube were placed and brought out through separate incisions.  The sternum was closed with wire.  The pectoralis fascia was closed with running #1 Vicryl.  Subcutaneous and skin layers were closed with running 0 Vicryl and sterile dressings were applied.  Total cardiopulmonary bypass time  was 141 minutes.  The patient returned to the ICU in stable condition.     Kerin Perna, M.D.   ______________________________ Kerin Perna, M.D.    PV/MEDQ  D:  01/21/2016  T:  01/21/2016  Job:  [id number]  cc:   Lyn Records, M.D.

## 2016-01-21 NOTE — Progress Notes (Signed)
1 Day Post-Op Procedure(s) (LRB): CORONARY ARTERY BYPASS GRAFTING (CABG) times four using left internal mammary artery and right saphenous vein (N/A) TRANSESOPHAGEAL ECHOCARDIOGRAM (TEE) (N/A) Subjective: CABG x4 - ischemic cardiomyopathy with nonstemi at presentation Poorly controlled DM with necrotic R great toe ulcer Stable hemodynamics with PACs  Objective: Vital signs in last 24 hours: Temp:  [97.7 F (36.5 C)-101.7 F (38.7 C)] 99.3 F (37.4 C) (02/03 0700) Pulse Rate:  [39-107] 76 (02/03 0700) Cardiac Rhythm:  [-] Normal sinus rhythm;Heart block (02/03 0745) Resp:  [8-26] 15 (02/03 0700) BP: (86-127)/(55-91) 86/63 mmHg (02/03 0700) SpO2:  [97 %-100 %] 100 % (02/03 0700) Arterial Line BP: (79-143)/(39-73) 86/47 mmHg (02/03 0700) FiO2 (%):  [40 %-50 %] 40 % (02/02 1600) Weight:  [258 lb 9.6 oz (117.3 kg)] 258 lb 9.6 oz (117.3 kg) (02/03 0540)  Hemodynamic parameters for last 24 hours: PAP: (20-48)/(7-27) 32/24 mmHg CO:  [5.2 L/min-7.7 L/min] 6.6 L/min CI:  [2.2 L/min/m2-3.2 L/min/m2] 2.8 L/min/m2  Intake/Output from previous day: 02/02 0701 - 02/03 0700 In: 6779.2 [I.V.:3818.2; Blood:1761; IV Piggyback:1200] Out: 3900 [Urine:3355; Emesis/NG output:75; Chest Tube:470] Intake/Output this shift:        Exam    General- alert and comfortable   Lungs- clear without rales, wheezes   Cor- regular rate and rhythm, no murmur , gallop   Abdomen- soft, non-tender   Extremities - warm, non-tender, minimal edema   Neuro- oriented, appropriate, no focal weakness   Lab Results:  Recent Labs  01/20/16 2100 01/20/16 2111 01/21/16 0420  WBC 16.5*  --  15.1*  HGB 15.3 15.6 13.3  HCT 43.7 46.0 38.7*  PLT 122*  --  119*   BMET:  Recent Labs  01/20/16 0352  01/20/16 2111 01/21/16 0420  NA 138  < > 140 137  K 4.5  < > 4.5 4.4  CL 102  < > 104 106  CO2 26  --   --  23  GLUCOSE 229*  < > 140* 125*  BUN 17  < > 17 15  CREATININE 1.05  < > 0.80 1.08  CALCIUM 9.6  --    --  8.4*  < > = values in this interval not displayed.  PT/INR:  Recent Labs  01/20/16 1440  LABPROT 16.7*  INR 1.34   ABG    Component Value Date/Time   PHART 7.333* 01/20/2016 1629   HCO3 24.1* 01/20/2016 1629   TCO2 24 01/20/2016 2111   ACIDBASEDEF 2.0 01/20/2016 1629   O2SAT 96.0 01/20/2016 1629   CBG (last 3)   Recent Labs  01/21/16 0449 01/21/16 0550 01/21/16 0650  GLUCAP 121* 116* 128*    Assessment/Plan: S/P Procedure(s) (LRB): CORONARY ARTERY BYPASS GRAFTING (CABG) times four using left internal mammary artery and right saphenous vein (N/A) TRANSESOPHAGEAL ECHOCARDIOGRAM (TEE) (N/A) Mobilize Diuresis Diabetes control d/c tubes/lines See progression orders leave on renal dopamine today   LOS: 9 days    Austin Arnold 01/21/2016

## 2016-01-21 NOTE — Progress Notes (Signed)
Patient had a sudden spike in BP of 150-200 MAP 110s, complaints of chest pain, "It feels like it did the first time I came in", restless in the bed, EKG obtained-no changes from this mornings EKG, Neo was turned off and Nitro was restarted, BP back down to 120s over 60s, Nitro at 25 mcgs, notified Dr. Donata Clay, Dr. Donata Clay will put in orders for PRN xanax.  Hermina Barters, RN

## 2016-01-21 NOTE — Op Note (Deleted)
Austin Arnold, SWITALSKI NO.:  [id number]  MEDICAL RECORD NO.:  [id number]  LOCATION:                               FACILITY:  [facility]  PHYSICIAN:  Kerin Perna, M.D.  DATE OF BIRTH:  1966/03/17  DATE OF PROCEDURE: DATE OF DISCHARGE:                              OPERATIVE REPORT   OPERATION: 1. Coronary artery bypass grafting x4 (left internal mammary artery to     left anterior descending coronary artery, saphenous vein graft to     ramus intermediate, sequential saphenous vein graft to obtuse     marginal continuing to the posterior lateral branch of the distal     circumflex). 2. Endoscopic harvest of right leg greater saphenous vein.  PREOPERATIVE DIAGNOSES:  Unstable angina, non-ST elevation myocardial infarction with severe 3-vessel coronary disease, and moderate LV dysfunction.  POSTOPERATIVE DIAGNOSES:  Unstable angina, non-ST elevation myocardial infarction with severe 3-vessel coronary disease, and moderate LV dysfunction.  SURGEON:  Kerin Perna, MD  ASSISTANT:  Lowella Dandy, PA-C  ANESTHESIA:  General.  INDICATIONS:  The patient is a 50 year old, diabetic reformed smoker who has poorly controlled diabetes and presented with non ST elevation MI. Previously, he had a PCI several years ago.  He was admitted to the hospital and underwent urgent cardiac catheterization which demonstrated severe 2-vessel coronary disease with a nondominant small right coronary.  There was moderate LV dysfunction.  It was felt that surgical revascularization would be his best long-term therapy with severe multivessel disease and reduced LV function.  I examined the patient in his hospital room and reviewed the results of the cardiac catheterization with the patient and his wife.  I discussed the indications and expected benefits of coronary artery bypass surgery for treatment of his coronary artery disease.  I reviewed the alternatives to surgical therapy as  well.  I discussed with the patient the risks to him of heart bypass surgery including risks of stroke, MI, bleeding, blood transfusion requirement; postoperative pulmonary problems including pleural effusion, postoperative wound infection problems related to his poor diabetic control, and postoperative arrhythmias and/or death.  After reviewing these issues, he demonstrated his understanding and agreed to proceed with surgery under what I felt was an informed consent.  OPERATIVE FINDINGS: 1. The right coronary was too small to graft. 2. Heavily diseased coronary suboptimal targets for grafting. 3. Adequate conduit. 4. Improved global LV function after surgical revascularization after     separation and cardiopulmonary bypass.  OPERATIVE PROCEDURE:  The patient was brought to the operating room, placed supine on the operating table.  General anesthesia was induced. The transesophageal echo probe was placed by the anesthesiologist.  The chest, abdomen, and legs were prepped with Betadine and draped as a sterile field.  A sternal incision was made.  The saphenous vein was harvested endoscopically from the right leg.  The left internal mammary artery was harvested as a pedicle graft from its origin at the subclavian vessels.  It was 1.5-mm vessel with adequate flow.  The sternal retractor was replaced and the pericardium opened and suspended. Pursestrings were placed in the ascending aorta and right atrium.  After heparin was administered and the ACT was  documented as being therapeutic, the patient was cannulated and placed on cardiopulmonary bypass.  The cornua were identified for grafting.  The LAD was intramyocardial, but was dissected out in the mid septal area as an acceptable target.  It was felt that the left IMA graft to the LAD would provide accurate fusions of the diagonal as well.  The ramus intermediate was a small vessel, but graftable.  He had an 80% stenosis. The LAD had  a 95% stenosis.  The OM1 had a 90% stenosis and was an adequate target.  Posterior branch of the distal circumflex with a smaller 1.2-mm vessel, but graftable.  The mammary artery and vein grafts were prepared for the distal anastomoses and cardioplegia cannulas were placed both antegrade and retrograde cold blood cardioplegia.  The patient was cooled to 32 degrees and an aortic crossclamp was applied.  One liter of cold blood cardioplegia was delivered in split doses between the antegrade aortic and retrograde coronary sinus catheters.  There was good cardioplegic arrest and septal temperature dropped less than 12 degrees.  Cardioplegia was delivered every 20 minutes or less.  The distal coronary anastomoses were performed.  The first distal anastomosis was the sequential vein graft to the OM continuing at the PL.  The OM was a 1.5-mm vessel proximal 90% stenosis.  A side-to-side anastomosis with the vein was constructed using running 7-0 Prolene, a good flow through the graft.  The sequential graft was then continued to the posterior lateral branch of the distal circumflex.  This was a smaller 1.2-mm vessel and the end of the vein was sewn end-to-side with running 7-0 Prolene with good flow through the graft.  Cardioplegia was then redosed through the vein graft.  The third distal anastomosis was to the ramus intermediate branch of the left coronary.  There was a 1.5-mm vessel proximal 80% stenosis.  A reverse saphenous vein was sewn end-to-side with running 7-0 Prolene. There was good flow through the graft.  Cardioplegia was redosed.  Attention was then directed to the LAD.  The LAD was opened.  It had deoxygenated blood, but had the wall characteristics of an artery.  I probed the opening distally and the probe followed the path of the intramyocardial vessel to the exposed area of the LAD at the apex confirming that this was the LAD.  I also opened the diagonal 1 mm and passed a  probe proximally to demonstrate and confirm that the diagonal was a branch of the LAD.  The small opening in the LAD was then closed with some interrupted 8-0 Prolenes.  The left IMA pedicle was brought through an opening in the lateral pericardium and brought down onto the LAD and sewn end-to-side with running 8-0 Prolene.  There was good flow through the anastomosis after briefly releasing the pedicle bulldog in the mammary artery.  The bulldog was reapplied and the pedicle secured to the epicardium with 6-0 Prolenes.  Cardioplegia was redosed.  The crossclamp was still in place, 2 proximal vein anastomoses were performed on the ascending aorta using a 4.5 mm punch and running 6-0 Prolene.  Prior to removing the crossclamp, air was vented from the coronaries with a dose of retrograde and warm blood cardioplegia.  The crossclamp was removed.  The heart was cardioverted back to a regular rhythm.  The vein grafts were de-aired and opened, each had good flow.  Hemostasis was documented at the proximal and distal anastomoses.  The patient was rewarmed and reperfused.  Temporary pacing  wires were applied.  Low-dose milrinone and dopamine were started.  The lungs were expanded and ventilator was resumed.  The patient was then weaned off cardiopulmonary bypass without difficulty.  Hemodynamics were stable.  Echo continued to improve and showed significant improved global LV function.  Protamine was administered without adverse reaction.  The cannulas were removed.  The mediastinum was irrigated.  The superior pericardium was closed over the aorta and vein grafts.  Anterior mediastinal and left pleural chest tube were placed and brought out through separate incisions.  The sternum was closed with wire.  The pectoralis fascia was closed with running #1 Vicryl.  Subcutaneous and skin layers were closed with running 0 Vicryl and sterile dressings were applied.  Total cardiopulmonary bypass time  was 141 minutes.  The patient returned to the ICU in stable condition.     Kerin Perna, M.D.   ______________________________ Kerin Perna, M.D.    PV/MEDQ  D:  01/21/2016  T:  01/21/2016  Job:  [id number]  cc:   Lyn Records, M.D.

## 2016-01-22 ENCOUNTER — Inpatient Hospital Stay (HOSPITAL_COMMUNITY): Payer: Medicare Other

## 2016-01-22 DIAGNOSIS — I25810 Atherosclerosis of coronary artery bypass graft(s) without angina pectoris: Secondary | ICD-10-CM | POA: Insufficient documentation

## 2016-01-22 LAB — BASIC METABOLIC PANEL
Anion gap: 9 (ref 5–15)
BUN: 14 mg/dL (ref 6–20)
CO2: 26 mmol/L (ref 22–32)
Calcium: 8.8 mg/dL — ABNORMAL LOW (ref 8.9–10.3)
Chloride: 97 mmol/L — ABNORMAL LOW (ref 101–111)
Creatinine, Ser: 1.07 mg/dL (ref 0.61–1.24)
GFR calc Af Amer: >60 mL/min (ref >60)
GFR calc non Af Amer: >60 mL/min (ref >60)
Glucose, Bld: 226 mg/dL — ABNORMAL HIGH (ref 65–99)
Potassium: 4.7 mmol/L (ref 3.5–5.1)
Sodium: 132 mmol/L — ABNORMAL LOW (ref 135–145)

## 2016-01-22 LAB — GLUCOSE, CAPILLARY
GLUCOSE-CAPILLARY: 163 mg/dL — AB (ref 65–99)
GLUCOSE-CAPILLARY: 212 mg/dL — AB (ref 65–99)
Glucose-Capillary: 227 mg/dL — ABNORMAL HIGH (ref 65–99)
Glucose-Capillary: 166 mg/dL — ABNORMAL HIGH (ref 65–99)
Glucose-Capillary: 199 mg/dL — ABNORMAL HIGH (ref 65–99)
Glucose-Capillary: 207 mg/dL — ABNORMAL HIGH (ref 65–99)

## 2016-01-22 LAB — CBC
HCT: 36.3 % — ABNORMAL LOW (ref 39.0–52.0)
Hemoglobin: 12.3 g/dL — ABNORMAL LOW (ref 13.0–17.0)
MCH: 30.6 pg (ref 26.0–34.0)
MCHC: 33.9 g/dL (ref 30.0–36.0)
MCV: 90.3 fL (ref 78.0–100.0)
Platelets: 85 10*3/uL — ABNORMAL LOW (ref 150–400)
RBC: 4.02 MIL/uL — ABNORMAL LOW (ref 4.22–5.81)
RDW: 13.4 % (ref 11.5–15.5)
WBC: 14.3 10*3/uL — ABNORMAL HIGH (ref 4.0–10.5)

## 2016-01-22 MED ORDER — PREDNISOLONE 5 MG PO TABS
5.0000 mg | ORAL_TABLET | Freq: Every day | ORAL | Status: DC
  Administered 2016-01-22: 5 mg via ORAL
  Filled 2016-01-22 (×2): qty 1

## 2016-01-22 MED ORDER — SODIUM CHLORIDE 0.9% FLUSH
3.0000 mL | Freq: Two times a day (BID) | INTRAVENOUS | Status: DC
  Administered 2016-01-22 – 2016-01-27 (×9): 3 mL via INTRAVENOUS

## 2016-01-22 MED ORDER — PANTOPRAZOLE SODIUM 40 MG PO TBEC
40.0000 mg | DELAYED_RELEASE_TABLET | Freq: Every day | ORAL | Status: DC
  Administered 2016-01-23 – 2016-01-27 (×5): 40 mg via ORAL
  Filled 2016-01-22 (×5): qty 1

## 2016-01-22 MED ORDER — ASPIRIN EC 325 MG PO TBEC
325.0000 mg | DELAYED_RELEASE_TABLET | Freq: Every day | ORAL | Status: DC
  Administered 2016-01-22 – 2016-01-27 (×6): 325 mg via ORAL
  Filled 2016-01-22 (×5): qty 1

## 2016-01-22 MED ORDER — SODIUM CHLORIDE 0.9 % IV SOLN: 250.0000 mL | INTRAVENOUS | Status: DC | PRN

## 2016-01-22 MED ORDER — METOPROLOL TARTRATE 12.5 MG HALF TABLET
12.5000 mg | ORAL_TABLET | Freq: Two times a day (BID) | ORAL | Status: DC
  Administered 2016-01-22 (×2): 12.5 mg via ORAL
  Filled 2016-01-22: qty 1

## 2016-01-22 MED ORDER — DOCUSATE SODIUM 100 MG PO CAPS
200.0000 mg | ORAL_CAPSULE | Freq: Every day | ORAL | Status: DC
  Administered 2016-01-22 – 2016-01-27 (×5): 200 mg via ORAL
  Filled 2016-01-22 (×3): qty 2

## 2016-01-22 MED ORDER — OXYCODONE HCL 5 MG PO TABS
5.0000 mg | ORAL_TABLET | ORAL | Status: DC | PRN
  Administered 2016-01-22 – 2016-01-23 (×4): 10 mg via ORAL
  Administered 2016-01-24: 5 mg via ORAL
  Administered 2016-01-25: 10 mg via ORAL
  Administered 2016-01-26: 5 mg via ORAL
  Filled 2016-01-22 (×3): qty 2
  Filled 2016-01-22: qty 1
  Filled 2016-01-22 (×2): qty 2
  Filled 2016-01-22: qty 1

## 2016-01-22 MED ORDER — MOVING RIGHT ALONG BOOK
Freq: Once | Status: AC
  Administered 2016-01-22: 10:00:00
  Filled 2016-01-22: qty 1

## 2016-01-22 MED ORDER — BISACODYL 10 MG RE SUPP: 10.0000 mg | Freq: Every day | RECTAL | Status: DC | PRN

## 2016-01-22 MED ORDER — TRAMADOL HCL 50 MG PO TABS
50.0000 mg | ORAL_TABLET | ORAL | Status: DC | PRN
  Administered 2016-01-22 – 2016-01-23 (×3): 100 mg via ORAL
  Administered 2016-01-23: 50 mg via ORAL
  Administered 2016-01-24 (×3): 100 mg via ORAL
  Administered 2016-01-25 (×3): 50 mg via ORAL
  Administered 2016-01-26 – 2016-01-27 (×6): 100 mg via ORAL
  Filled 2016-01-22 (×5): qty 2
  Filled 2016-01-22: qty 1
  Filled 2016-01-22 (×8): qty 2
  Filled 2016-01-22: qty 1
  Filled 2016-01-22: qty 2

## 2016-01-22 MED ORDER — INSULIN ASPART 100 UNIT/ML ~~LOC~~ SOLN
0.0000 [IU] | Freq: Three times a day (TID) | SUBCUTANEOUS | Status: DC
  Administered 2016-01-22: 4 [IU] via SUBCUTANEOUS
  Administered 2016-01-22: 8 [IU] via SUBCUTANEOUS
  Administered 2016-01-23 (×3): 4 [IU] via SUBCUTANEOUS
  Administered 2016-01-23: 8 [IU] via SUBCUTANEOUS
  Administered 2016-01-24: 4 [IU] via SUBCUTANEOUS
  Administered 2016-01-24: 8 [IU] via SUBCUTANEOUS
  Administered 2016-01-24: 4 [IU] via SUBCUTANEOUS
  Administered 2016-01-25: 12 [IU] via SUBCUTANEOUS
  Administered 2016-01-25 (×2): 2 [IU] via SUBCUTANEOUS
  Administered 2016-01-25: 4 [IU] via SUBCUTANEOUS
  Administered 2016-01-26: 12 [IU] via SUBCUTANEOUS
  Administered 2016-01-26: 8 [IU] via SUBCUTANEOUS
  Administered 2016-01-27: 2 [IU] via SUBCUTANEOUS

## 2016-01-22 MED ORDER — ONDANSETRON HCL 4 MG/2ML IJ SOLN
4.0000 mg | Freq: Four times a day (QID) | INTRAMUSCULAR | Status: DC | PRN
  Administered 2016-01-24: 4 mg via INTRAVENOUS
  Filled 2016-01-22: qty 2

## 2016-01-22 MED ORDER — BISACODYL 5 MG PO TBEC
10.0000 mg | DELAYED_RELEASE_TABLET | Freq: Every day | ORAL | Status: DC | PRN
  Administered 2016-01-22: 10 mg via ORAL

## 2016-01-22 MED ORDER — ONDANSETRON HCL 4 MG PO TABS: 4.0000 mg | ORAL_TABLET | Freq: Four times a day (QID) | ORAL | Status: DC | PRN

## 2016-01-22 MED ORDER — SODIUM CHLORIDE 0.9% FLUSH: 3.0000 mL | INTRAVENOUS | Status: DC | PRN

## 2016-01-22 NOTE — Progress Notes (Signed)
Pt transferred to 2West via ambulation on 2L per pt request, vss, settled in bed, receiving RN at bedside. Louie Bun S 11:47 AM

## 2016-01-22 NOTE — Progress Notes (Signed)
Patient ID: Austin Arnold, male   DOB: 1966/07/16, 50 y.o.   MRN: [id number] TCTS DAILY ICU PROGRESS NOTE                   [address]            [place] [address]          [phone]   2 Days Post-Op Procedure(s) (LRB): CORONARY ARTERY BYPASS GRAFTING (CABG) times four using left internal mammary artery and right saphenous vein (N/A) TRANSESOPHAGEAL ECHOCARDIOGRAM (TEE) (N/A)  Total Length of Stay:  LOS: 10 days   Subjective: Feels ok, foley out early this am  Objective: Vital signs in last 24 hours: Temp:  [97.9 F (36.6 C)-99 F (37.2 C)] 98 F (36.7 C) (02/04 0809) Pulse Rate:  [40-110] 85 (02/04 0800) Cardiac Rhythm:  [-] Normal sinus rhythm (02/04 0800) Resp:  [0-26] 17 (02/04 0800) BP: (90-147)/(52-103) 90/57 mmHg (02/04 0800) SpO2:  [90 %-100 %] 100 % (02/04 0800) Arterial Line BP: (109-146)/(36-71) 110/60 mmHg (02/03 1530) Weight:  [259 lb 4.2 oz (117.6 kg)] 259 lb 4.2 oz (117.6 kg) (02/04 0500)  Filed Weights   01/20/16 0528 01/21/16 0540 01/22/16 0500  Weight: 249 lb (112.946 kg) 258 lb 9.6 oz (117.3 kg) 259 lb 4.2 oz (117.6 kg)    Weight change: 10.6 oz (0.3 kg)   Hemodynamic parameters for last 24 hours:    Intake/Output from previous day: 02/03 0701 - 02/04 0700 In: 1554.3 [P.O.:300; I.V.:754.3; IV Piggyback:500] Out: 2750 [Urine:2680; Chest Tube:70]  Intake/Output this shift: Total I/O In: 52.8 [I.V.:52.8] Out: -   Current Meds: Scheduled Meds: . acetaminophen  1,000 mg Oral 4 times per day   Or  . acetaminophen (TYLENOL) oral liquid 160 mg/5 mL  1,000 mg Per Tube 4 times per day  . ALPRAZolam  0.5 mg Oral BID  . amiodarone  400 mg Oral BID  . antiseptic oral rinse  7 mL Mouth Rinse BID  . aspirin EC  325 mg Oral Daily   Or  . aspirin  324 mg Per Tube Daily  . atorvastatin  80 mg Oral q1800  . bisacodyl  10 mg Oral Daily   Or  . bisacodyl  10 mg Rectal Daily  . cefUROXime (ZINACEF)  IV  1.5 g Intravenous Q12H  .  docusate sodium  200 mg Oral Daily  . furosemide  20 mg Intravenous BID  . insulin aspart  0-24 Units Subcutaneous 6 times per day  . insulin detemir  25 Units Subcutaneous BID  . insulin regular  5 Units Intravenous TID WC  . ketorolac  15 mg Intravenous 4 times per day  . lithium carbonate  600 mg Oral Daily  . metoprolol tartrate  12.5 mg Oral BID   Or  . metoprolol tartrate  12.5 mg Per Tube BID  . mupirocin cream   Topical BID  . pantoprazole  40 mg Oral Daily  . predniSONE  5 mg Oral QHS  . sodium chloride flush  3 mL Intravenous Q12H  . traZODone  100 mg Oral QHS   Continuous Infusions: . sodium chloride 20 mL/hr at 01/21/16 0700  . sodium chloride Stopped (01/21/16 0519)  . sodium chloride 10 mL/hr at 01/21/16 0700  . DOPamine 3 mcg/kg/min (01/22/16 0900)  . lactated ringers 20 mL/hr at 01/22/16 0900  . lactated ringers 20 mL/hr at 01/20/16 1430  . nitroGLYCERIN Stopped (01/21/16 1200)  . phenylephrine (NEO-SYNEPHRINE) Adult infusion Stopped (01/21/16  1100)   PRN Meds:.sodium chloride, metoprolol, midazolam, morphine injection, ondansetron (ZOFRAN) IV, oxyCODONE, sodium chloride flush, traMADol  General appearance: alert and cooperative Neurologic: intact Heart: irregularly irregular rhythm Lungs: clear to auscultation bilaterally Abdomen: soft, non-tender; bowel sounds normal; no masses,  no organomegaly Extremities: extremities normal, atraumatic, no cyanosis or edema and Homans sign is negative, no sign of DVT Wound: sternum stable  Lab Results: CBC: Recent Labs  01/21/16 1555 01/22/16 0500  WBC 13.5* 14.3*  HGB 13.0 12.3*  HCT 38.5* 36.3*  PLT 99* 85*   BMET:  Recent Labs  01/21/16 0420 01/21/16 1550 01/21/16 1555 01/22/16 0500  NA 137 134*  --  132*  K 4.4 4.1  --  4.7  CL 106 98*  --  97*  CO2 23  --   --  26  GLUCOSE 125* 238*  --  226*  BUN 15 18  --  14  CREATININE 1.08 1.00 1.11 1.07  CALCIUM 8.4*  --   --  8.8*    PT/INR:  Recent  Labs  01/20/16 1440  LABPROT 16.7*  INR 1.34   Radiology: Dg Chest Port 1 View  01/22/2016  CLINICAL DATA:  Status post CABG. EXAM: PORTABLE CHEST 1 VIEW COMPARISON:  01/21/2016 FINDINGS: Sequelae of recent CABG are again identified. Swan-Ganz catheter has been removed. Right jugular sheath remains. Mediastinal drain and left chest tube have been removed. Cardiac silhouette remains enlarged. Focal opacity in the lateral left mid to lower lung is noted at the site of previous chest tube. There is increased opacity in the basilar left lung likely reflecting atelectasis and a small left pleural effusion. The right lung remains clear. No pneumothorax is identified. IMPRESSION: 1. Interval removal of support devices as above. 2. Left basilar atelectasis and small left pleural effusion. Electronically Signed   By: Sebastian Ache M.D.   On: 01/22/2016 07:30     Assessment/Plan: S/P Procedure(s) (LRB): CORONARY ARTERY BYPASS GRAFTING (CABG) times four using left internal mammary artery and right saphenous vein (N/A) TRANSESOPHAGEAL ECHOCARDIOGRAM (TEE) (N/A) Mobilize Diuresis Diabetes control Plan for transfer to step-down: see transfer orders sinus with frequent pac's Mild thrombocytopenia    Delight Ovens 01/22/2016 9:34 AM

## 2016-01-22 NOTE — Progress Notes (Addendum)
Report called to [facility], [name]. Louie Bun S 11:04 AM   Kendal Hymen, wife, made aware of patient new room. 11:06 AM

## 2016-01-22 NOTE — Progress Notes (Addendum)
CARDIAC REHAB PHASE I   PRE:  Rate/Rhythm: 100ST c freq. PAC's  BP:   Sitting: 114/59     SaO2: 100 2L  88-92% RA  MODE:  Ambulation: 466 ft   POST:  Rate/Rhythm: 112 ST c freq. PAC's  BP:   Sitting: 97/55 after 4 minutes 117/53     SaO2: 87/88% RA and 100%2L  1326-1405  Pt ambulated 486ft with one person A and gait belt. Pt walked without O2 and did well until 34ft from room. Pt complained of LBP and feeling tired. At that time SpO2 was 87%. Pt stopped for a short rest break and encouraged PLB SP02 was 88%. Continued to walk to room and returned pt to recliner where he started to feel lightheaded and fatigue. Expressed to the pt to continue to breathe from diaphragm instead of chest and to keep head leveled. Pt did so and SpO2 was 92%. Pt felt more comfortable on O2. Placed pt on 2L and SpO2 was 100%. Pt BP immediately after walk was 97/55 and rechecked 4 minutes later was 117/53.  Raiden Haydu D Alishea Beaudin,MS,ACSM-RCEP 01/22/2016 1:56 PM

## 2016-01-23 ENCOUNTER — Inpatient Hospital Stay (HOSPITAL_COMMUNITY): Payer: Medicare Other

## 2016-01-23 LAB — CBC
HCT: 34.8 % — ABNORMAL LOW (ref 39.0–52.0)
Hemoglobin: 11.9 g/dL — ABNORMAL LOW (ref 13.0–17.0)
MCH: 30.9 pg (ref 26.0–34.0)
MCHC: 34.2 g/dL (ref 30.0–36.0)
MCV: 90.4 fL (ref 78.0–100.0)
Platelets: 86 10*3/uL — ABNORMAL LOW (ref 150–400)
RBC: 3.85 MIL/uL — ABNORMAL LOW (ref 4.22–5.81)
RDW: 13.4 % (ref 11.5–15.5)
WBC: 13.5 10*3/uL — ABNORMAL HIGH (ref 4.0–10.5)

## 2016-01-23 LAB — BASIC METABOLIC PANEL
Anion gap: 8 (ref 5–15)
BUN: 15 mg/dL (ref 6–20)
CO2: 26 mmol/L (ref 22–32)
Calcium: 8.9 mg/dL (ref 8.9–10.3)
Chloride: 96 mmol/L — ABNORMAL LOW (ref 101–111)
Creatinine, Ser: 1.17 mg/dL (ref 0.61–1.24)
GFR calc Af Amer: >60 mL/min (ref >60)
GFR calc non Af Amer: >60 mL/min (ref >60)
Glucose, Bld: 208 mg/dL — ABNORMAL HIGH (ref 65–99)
Potassium: 4.9 mmol/L (ref 3.5–5.1)
Sodium: 130 mmol/L — ABNORMAL LOW (ref 135–145)

## 2016-01-23 LAB — GLUCOSE, CAPILLARY
GLUCOSE-CAPILLARY: 177 mg/dL — AB (ref 65–99)
Glucose-Capillary: 193 mg/dL — ABNORMAL HIGH (ref 65–99)
Glucose-Capillary: 170 mg/dL — ABNORMAL HIGH (ref 65–99)
Glucose-Capillary: 224 mg/dL — ABNORMAL HIGH (ref 65–99)

## 2016-01-23 LAB — MAGNESIUM: Magnesium: 2 mg/dL (ref 1.7–2.4)

## 2016-01-23 LAB — TSH: TSH: 2.243 u[IU]/mL (ref 0.350–4.500)

## 2016-01-23 MED ORDER — AMIODARONE HCL 200 MG PO TABS
200.0000 mg | ORAL_TABLET | Freq: Two times a day (BID) | ORAL | Status: DC
  Administered 2016-01-23 – 2016-01-27 (×9): 200 mg via ORAL
  Filled 2016-01-23 (×9): qty 1

## 2016-01-23 MED ORDER — LACTULOSE 10 GM/15ML PO SOLN: 20.0000 g | Freq: Every day | ORAL | Status: DC | PRN

## 2016-01-23 MED ORDER — METOPROLOL TARTRATE 25 MG PO TABS
25.0000 mg | ORAL_TABLET | Freq: Two times a day (BID) | ORAL | Status: DC
  Administered 2016-01-23 – 2016-01-27 (×9): 25 mg via ORAL
  Filled 2016-01-23 (×9): qty 1

## 2016-01-23 MED ORDER — FUROSEMIDE 10 MG/ML IJ SOLN
40.0000 mg | Freq: Once | INTRAMUSCULAR | Status: AC
  Administered 2016-01-23: 40 mg via INTRAVENOUS
  Filled 2016-01-23: qty 4

## 2016-01-23 MED ORDER — PREDNISONE 5 MG PO TABS
5.0000 mg | ORAL_TABLET | Freq: Every day | ORAL | Status: DC
  Administered 2016-01-23 – 2016-01-26 (×4): 5 mg via ORAL
  Filled 2016-01-23 (×4): qty 1

## 2016-01-23 MED ORDER — POTASSIUM CHLORIDE CRYS ER 20 MEQ PO TBCR
20.0000 meq | EXTENDED_RELEASE_TABLET | Freq: Every day | ORAL | Status: DC
  Administered 2016-01-23 – 2016-01-27 (×5): 20 meq via ORAL
  Filled 2016-01-23 (×5): qty 1

## 2016-01-23 NOTE — Progress Notes (Signed)
Utilization review completed.  

## 2016-01-23 NOTE — Progress Notes (Addendum)
      [address]       [place] [address]             [phone]      3 Days Post-Op Procedure(s) (LRB): CORONARY ARTERY BYPASS GRAFTING (CABG) times four using left internal mammary artery and right saphenous vein (N/A) TRANSESOPHAGEAL ECHOCARDIOGRAM (TEE) (N/A)   Subjective:  Austin Arnold states he is doing okay. He continues to have some chest discomfort and isn't sleeping that great.  + ambulation   + BM  Objective: Vital signs in last 24 hours: Temp:  [98 F (36.7 C)-99.3 F (37.4 C)] 99 F (37.2 C) (02/05 0541) Pulse Rate:  [51-112] 51 (02/05 0541) Cardiac Rhythm:  [-] Normal sinus rhythm (02/04 2030) Resp:  [8-21] 20 (02/05 0541) BP: (100-134)/(58-90) 100/70 mmHg (02/05 0541) SpO2:  [91 %-100 %] 92 % (02/05 0541) Weight:  [262 lb 3.2 oz (118.933 kg)] 262 lb 3.2 oz (118.933 kg) (02/05 0132)  Intake/Output from previous day: 02/04 0701 - 02/05 0700 In: 292.8 [P.O.:240; I.V.:52.8] Out: 1425 [Urine:1425]  General appearance: alert, cooperative and no distress Heart: regular rate and rhythm Lungs: diminished breath sounds bibasilar Abdomen: soft, non-tender; bowel sounds normal; no masses,  no organomegaly Extremities: edema trace Wound: clean and dry, some area at top of sternotomy with mild separation no drainage present  Lab Results:  Recent Labs  01/22/16 0500 01/23/16 0323  WBC 14.3* 13.5*  HGB 12.3* 11.9*  HCT 36.3* 34.8*  PLT 85* 86*   BMET:  Recent Labs  01/22/16 0500 01/23/16 0323  NA 132* 130*  K 4.7 4.9  CL 97* 96*  CO2 26 26  GLUCOSE 226* 208*  BUN 14 15  CREATININE 1.07 1.17  CALCIUM 8.8* 8.9    PT/INR:  Recent Labs  01/20/16 1440  LABPROT 16.7*  INR 1.34   ABG    Component Value Date/Time   PHART 7.318* 01/20/2016 1736   HCO3 22.8 01/20/2016 1736   TCO2 21 01/21/2016 1550   ACIDBASEDEF 3.0* 01/20/2016 1736   O2SAT 95.0 01/20/2016 1736   CBG (last 3)   Recent Labs  01/22/16 1613 01/22/16 2122  01/23/16 0605  GLUCAP 199* 207* 177*    Assessment/Plan: S/P Procedure(s) (LRB): CORONARY ARTERY BYPASS GRAFTING (CABG) times four using left internal mammary artery and right saphenous vein (N/A) TRANSESOPHAGEAL ECHOCARDIOGRAM (TEE) (N/A)  1. CV- HR is erratic ranging from 50-110- will decrease Amiodarone to 200 mg BID, continue Lopressor for now 2. Pulm- no acute issues, small left pleural effusion/atelectasis continue IS 3. Renal- creatinine mildly elevated at 1.19, remains hypervolemic will start Lasix 4. DM- uncontrolled pre op- continue insulin regimen 5. Expected post operative blood loss anemia- mild Hgb is 11.9 6. Dispo- patient stable, monitor HR as erratic, diuresis   LOS: 11 days    BARRETT, ERIN 01/23/2016  Still sinus with pac's no afib on cordarone Thrombocytopenia 86,000 I have seen and examined Austin Arnold and agree with the above assessment  and plan.  Delight Ovens MD Beeper [phone] Office [phone] 01/23/2016 9:53 AM

## 2016-01-23 NOTE — Progress Notes (Signed)
Patient states that some years ago he had a CPAP machine with a nasal mask that he wore nightly until the mask caused him to have "open sores" on his nose.  He was unsure how long ago this was.  During this admission, he began to have trouble breathing while asleep and has requested CPAP machine.  Patient was placed on CPAP auto-titration mode (min 4, max 20) using nasal mask and room air.  Patient is familiar with the equipment and procedure.

## 2016-01-24 LAB — BASIC METABOLIC PANEL
Anion gap: 7 (ref 5–15)
BUN: 23 mg/dL — ABNORMAL HIGH (ref 6–20)
CO2: 28 mmol/L (ref 22–32)
Calcium: 8.7 mg/dL — ABNORMAL LOW (ref 8.9–10.3)
Chloride: 95 mmol/L — ABNORMAL LOW (ref 101–111)
Creatinine, Ser: 1.25 mg/dL — ABNORMAL HIGH (ref 0.61–1.24)
GFR calc Af Amer: >60 mL/min (ref >60)
GFR calc non Af Amer: >60 mL/min (ref >60)
Glucose, Bld: 198 mg/dL — ABNORMAL HIGH (ref 65–99)
Potassium: 4.6 mmol/L (ref 3.5–5.1)
Sodium: 130 mmol/L — ABNORMAL LOW (ref 135–145)

## 2016-01-24 LAB — GLUCOSE, CAPILLARY
GLUCOSE-CAPILLARY: 196 mg/dL — AB (ref 65–99)
Glucose-Capillary: 227 mg/dL — ABNORMAL HIGH (ref 65–99)
Glucose-Capillary: 157 mg/dL — ABNORMAL HIGH (ref 65–99)
Glucose-Capillary: 196 mg/dL — ABNORMAL HIGH (ref 65–99)

## 2016-01-24 MED ORDER — FUROSEMIDE 40 MG PO TABS
40.0000 mg | ORAL_TABLET | Freq: Every day | ORAL | Status: DC
  Administered 2016-01-24 – 2016-01-27 (×4): 40 mg via ORAL
  Filled 2016-01-24 (×4): qty 1

## 2016-01-24 MED ORDER — ACETAMINOPHEN 325 MG PO TABS
650.0000 mg | ORAL_TABLET | ORAL | Status: DC | PRN
  Filled 2016-01-24: qty 2

## 2016-01-24 MED ORDER — SORBITOL 70 % SOLN
60.0000 mL | Freq: Every morning | Status: AC
  Administered 2016-01-24: 60 mL via ORAL
  Filled 2016-01-24 (×2): qty 60

## 2016-01-24 NOTE — Progress Notes (Addendum)
      [address]       [place] [address]             [phone]        4 Days Post-Op Procedure(s) (LRB): CORONARY ARTERY BYPASS GRAFTING (CABG) times four using left internal mammary artery and right saphenous vein (N/A) TRANSESOPHAGEAL ECHOCARDIOGRAM (TEE) (N/A)  Subjective: Patient passing flatus but no bowel movement yet. Has complaints of incisional pain.  Objective: Vital signs in last 24 hours: Temp:  [98.2 F (36.8 C)-98.4 F (36.9 C)] 98.2 F (36.8 C) (02/06 0314) Pulse Rate:  [92-101] 101 (02/06 0314) Cardiac Rhythm:  [-] Normal sinus rhythm (02/06 0314) Resp:  [16-20] 18 (02/06 0314) BP: (86-128)/(58-79) 128/79 mmHg (02/06 0314) SpO2:  [90 %-95 %] 95 % (02/06 0323) Weight:  [261 lb 14.4 oz (118.797 kg)] 261 lb 14.4 oz (118.797 kg) (02/06 0314)  Pre op weight 113 kg Current Weight  01/24/16 261 lb 14.4 oz (118.797 kg)      Intake/Output from previous day: 02/05 0701 - 02/06 0700 In: 120 [P.O.:120] Out: -    Physical Exam:  Cardiovascular: IRRR. Pulmonary: Slightly diminished at bases bilaterally; no rales, wheezes, or rhonchi. Abdomen: Soft, non tender, bowel sounds present. Extremities: Bilateral lower extremity edema. Wounds: Clean and dry.  No erythema or signs of infection.  Lab Results: CBC: Recent Labs  01/22/16 0500 01/23/16 0323  WBC 14.3* 13.5*  HGB 12.3* 11.9*  HCT 36.3* 34.8*  PLT 85* 86*   BMET:  Recent Labs  01/23/16 0323 01/24/16 0430  NA 130* 130*  K 4.9 4.6  CL 96* 95*  CO2 26 28  GLUCOSE 208* 198*  BUN 15 23*  CREATININE 1.17 1.25*  CALCIUM 8.9 8.7*    PT/INR:  Lab Results  Component Value Date   INR 1.34 01/20/2016   INR 1.05 01/20/2016   INR 0.92 01/12/2016   ABG:  INR: Will add last result for INR, ABG once components are confirmed Will add last 4 CBG results once components are confirmed  Assessment/Plan:  1. CV - Slightly tachy/PACs in the low 100's. On Amiodarone 200 mg bid  and Lopressor 25 mg bid. 2.  Pulmonary - On 3 liters via [place]. Wean as tolerates. Encourage incentive spirometer and flutter valve. 3. Volume Overload - Given Lasix 40 mg IV yesterday with good diuresis. Will give oral today. 4.  Acute blood loss anemia - H and H yesterday 11.9 and 34.8 5. Creatinine slightly increased at 1.25 6. Hyponatremia-sodium remains 130 likely related to diuresis. 7. Thrombocytopenia-platelets 86,000 8.DM-CBGs 170/224/196. On Insulin. Was on Metformin pre op. Will restart at discharge as creatinine slightly elevated at 1.25.Pre op HGA1C 8. Will need follow up with medical doctor after discharge. 9. Remove EPW 10. LOC if no bowel movement today 11. Regarding pain control, on Oxy and Ultram. Will not give Tramadol as creatinine slightly elevated. Will discuss with Dr. Donata Clay if should give pain patch  ZIMMERMAN,DONIELLE MPA-C 01/24/2016,7:45 AM  Plan DC in am on metformin and solostar/Lantus once daily Sorbitol for BM patient examined and medical record reviewed,agree with above note. Kathlee Nations Trigt III 01/24/2016

## 2016-01-24 NOTE — Progress Notes (Signed)
Removed epicardial wires per order. 3 intact.  Pt tolerated procedure well.  Pt instructed to remain on bedrest for one hour.  Frequent vitals will be taken and documented. Pt resting with call bell within reach. Idaly Verret McClintock, RN   

## 2016-01-24 NOTE — Progress Notes (Signed)
CARDIAC REHAB PHASE I   PRE:  Rate/Rhythm: 100 SR  BP:  Sitting: 136/85        SaO2: 93 RA  MODE:  Ambulation: 490  ft   POST:  Rate/Rhythm: 105 ST  BP:  Sitting: 124/73         SaO2: 93 RA  Pt up in room ad lib, washing up independently, sats 93% on RA. Pt ambulated 490 ft on RA, standby assist, fairly steady gait, tolerated well. Pt c/o incisional pain, denies dizziness, DOE, declined rest stop. Pt O2 sats 93% on RA during ambulation, pt conversed during entire walk. Encouraged additional ambulation, IS. Pt to recliner after walk, call bell within reach. Will follow.    [phone] Joylene Grapes, RN, BSN 01/24/2016 10:06 AM

## 2016-01-24 NOTE — Progress Notes (Signed)
Chaplain visited with patient during routine spiritual care rounding.  Initial contact with patient was 01/17/16 prior to his surgery, he reports he is doing since his surgery and is looking forward to going home soon. Chaplain offered continued prayer of healing looking forward as he prepares to go home. He was appreciated the follow up visit. Chaplain Janell Quiet [phone]

## 2016-01-24 NOTE — Progress Notes (Signed)
Patient refused CPAP for the night.  RT will continue to monitor. 

## 2016-01-25 LAB — GLUCOSE, CAPILLARY
GLUCOSE-CAPILLARY: 153 mg/dL — AB (ref 65–99)
Glucose-Capillary: 297 mg/dL — ABNORMAL HIGH (ref 65–99)
Glucose-Capillary: 186 mg/dL — ABNORMAL HIGH (ref 65–99)
Glucose-Capillary: 159 mg/dL — ABNORMAL HIGH (ref 65–99)

## 2016-01-25 LAB — BASIC METABOLIC PANEL
Anion gap: 11 (ref 5–15)
BUN: 19 mg/dL (ref 6–20)
CO2: 27 mmol/L (ref 22–32)
Calcium: 8.9 mg/dL (ref 8.9–10.3)
Chloride: 96 mmol/L — ABNORMAL LOW (ref 101–111)
Creatinine, Ser: 1.11 mg/dL (ref 0.61–1.24)
GFR calc Af Amer: >60 mL/min (ref >60)
GFR calc non Af Amer: >60 mL/min (ref >60)
Glucose, Bld: 207 mg/dL — ABNORMAL HIGH (ref 65–99)
Potassium: 4.7 mmol/L (ref 3.5–5.1)
Sodium: 134 mmol/L — ABNORMAL LOW (ref 135–145)

## 2016-01-25 MED ORDER — VANCOMYCIN HCL IN DEXTROSE 1-5 GM/200ML-% IV SOLN
1000.0000 mg | Freq: Two times a day (BID) | INTRAVENOUS | Status: DC
  Administered 2016-01-25 – 2016-01-27 (×4): 1000 mg via INTRAVENOUS
  Filled 2016-01-25 (×6): qty 200

## 2016-01-25 MED ORDER — ALPRAZOLAM 0.5 MG PO TABS
0.5000 mg | ORAL_TABLET | Freq: Three times a day (TID) | ORAL | Status: DC
  Administered 2016-01-25 – 2016-01-26 (×4): 0.5 mg via ORAL
  Filled 2016-01-25 (×4): qty 1

## 2016-01-25 MED ORDER — VANCOMYCIN HCL IN DEXTROSE 1-5 GM/200ML-% IV SOLN
1000.0000 mg | INTRAVENOUS | Status: AC
  Administered 2016-01-25: 1000 mg via INTRAVENOUS
  Filled 2016-01-25: qty 200

## 2016-01-25 MED ORDER — BIOTENE DRY MOUTH MT LIQD
15.0000 mL | OROMUCOSAL | Status: DC | PRN
  Administered 2016-01-25: 15 mL via OROMUCOSAL
  Filled 2016-01-25 (×2): qty 15

## 2016-01-25 NOTE — Progress Notes (Signed)
Patient feeling overwhelmed and depressed.  States he felt better but feels he is regressing. He does not feel nauseous. Pain from previous neck and back surgery as well. Bed uncomfortable, loss of appetite.  Called [facility] and new orders received. Pt resting with call bell within reach.  Will continue to monitor. Thomas Hoff, RN

## 2016-01-25 NOTE — Progress Notes (Signed)
   01/25/16 2351  BiPAP/CPAP/SIPAP  BiPAP/CPAP/SIPAP Pt Type Adult (Patient refused CPAP at this time.)

## 2016-01-25 NOTE — Discharge Instructions (Signed)
Activity: 1.May walk up steps                2.No lifting more than ten pounds for four weeks.                 3.No driving for four weeks.                4.Stop any activity that causes chest pain, shortness of breath, dizziness, sweating or excessive weakness.                5.Avoid straining.                6.Continue with your breathing exercises daily.  Diet: Low fat, Low cholesterol diet  Wound Care: May shower.  Clean wounds with mild soap and water daily. Contact the office at [phone] if any problems arise.   Coronary Artery Bypass Grafting, Care After Refer to this sheet in the next few weeks. These instructions provide you with information on caring for yourself after your procedure. Your health care provider may also give you more specific instructions. Your treatment has been planned according to current medical practices, but problems sometimes occur. Call your health care provider if you have any problems or questions after your procedure. WHAT TO EXPECT AFTER THE PROCEDURE Recovery from surgery will be different for everyone. Some people feel well after 3 or 4 weeks, while for others it takes longer. After your procedure, it is typical to have the following:  Nausea and a lack of appetite.   Constipation.  Weakness and fatigue.   Depression or irritability.   Pain or discomfort at your incision site. HOME CARE INSTRUCTIONS  Take medicines only as directed by your health care provider. Do not stop taking medicines or start any new medicines without first checking with your health care provider.  Take your pulse as directed by your health care provider.  Perform deep breathing as directed by your health care provider. If you were given a device called an incentive spirometer, use it to practice deep breathing several times a day. Support your chest with a pillow or your arms when you take deep breaths or cough.  Keep incision areas clean, dry, and protected.  Remove or change any bandages (dressings) only as directed by your health care provider. You may have skin adhesive strips over the incision areas. Do not take the strips off. They will fall off on their own.  Check incision areas daily for any swelling, redness, or drainage.  If incisions were made in your legs, do the following:  Avoid crossing your legs.   Avoid sitting for long periods of time. Change positions every 30 minutes.   Elevate your legs when you are sitting.  Wear compression stockings as directed by your health care provider. These stockings help keep blood clots from forming in your legs.  Take showers once your health care provider approves. Until then, only take sponge baths. Pat incisions dry. Do not rub incisions with a washcloth or towel. Do not take baths, swim, or use a hot tub until your health care provider approves.  Eat foods that are high in fiber, such as raw fruits and vegetables, whole grains, beans, and nuts. Meats should be lean cut. Avoid canned, processed, and fried foods.  Drink enough fluid to keep your urine clear or pale yellow.  Weigh yourself every day. This helps identify if you are retaining fluid that may make your heart and lungs work harder.  Rest and limit activity as directed by your health care provider. You may be instructed to: °¨ Stop any activity at once if you have chest pain, shortness of breath, irregular heartbeats, or dizziness. Get help right away if you have any of these symptoms. °¨ Move around frequently for short periods or take short walks as directed by your health care provider. Increase your activities gradually. You may need physical therapy or cardiac rehabilitation to help strengthen your muscles and build your endurance. °¨ Avoid lifting, pushing, or pulling anything heavier than 10 lb (4.5 kg) for at least 6 weeks after surgery. °· Do not drive until your health care provider approves.  °· Ask your health care provider  when you may return to work. °· Ask your health care provider when you may resume sexual activity. °· Keep all follow-up visits as directed by your health care provider. This is important. °SEEK MEDICAL CARE IF: °· You have swelling, redness, increasing pain, or drainage at the site of an incision. °· You have a fever. °· You have swelling in your ankles or legs. °· You have pain in your legs.   °· You gain 2 or more pounds (0.9 kg) a day. °· You are nauseous or vomit. °· You have diarrhea.  °SEEK IMMEDIATE MEDICAL CARE IF: °· You have chest pain that goes to your jaw or arms. °· You have shortness of breath.   °· You have a fast or irregular heartbeat.   °· You notice a "clicking" in your breastbone (sternum) when you move.   °· You have numbness or weakness in your arms or legs. °· You feel dizzy or light-headed.   °MAKE SURE YOU: °· Understand these instructions. °· Will watch your condition. °· Will get help right away if you are not doing well or get worse. °  °This information is not intended to replace advice given to you by your health care provider. Make sure you discuss any questions you have with your health care provider. °  °Document Released: 06/23/2005 Document Revised: 12/25/2014 Document Reviewed: 05/13/2013 °Elsevier Interactive Patient Education ©2016 [employer]. ° ° ° °

## 2016-01-25 NOTE — Progress Notes (Signed)
Patient has a diabetic podiatrist a [employer] on [address] (Dr. Rexene Edison ?) and would like to have an appointment scheduled for him before he leaves. Thomas Hoff, RN

## 2016-01-25 NOTE — Progress Notes (Signed)
Inpatient Diabetes Program Recommendations  AACE/ADA: New Consensus Statement on Inpatient Glycemic Control (2015)  Target Ranges:  Prepandial:   less than 140 mg/dL      Peak postprandial:   less than 180 mg/dL (1-2 hours)      Critically ill patients:  140 - 180 mg/dL   Review of Glycemic Control  Results for Austin Arnold, Austin Arnold (MRN [id number]) as of 01/25/2016 13:29  Ref. Range 01/24/2016 11:54 01/24/2016 16:21 01/24/2016 21:21 01/25/2016 06:03 01/25/2016 11:23  Glucose-Capillary Latest Ref Range: 65-99 mg/dL 388 (H) 828 (H) 003 (H) 297 (H) 186 (H)  Results for Austin Arnold, Austin Arnold (MRN [id number]) as of 01/25/2016 13:29  Ref. Range 01/22/2016 05:00 01/23/2016 03:23 01/24/2016 04:30 01/25/2016 03:39  Glucose Latest Ref Range: 65-99 mg/dL 697 (H) 948 (H) 016 (H) 207 (H)  FBS continues to be elevated.  Inpatient Diabetes Program Recommendations:    Consider increasing Levemir to 27 units bid. If post-prandial blood sugars > 180 mg/dL, add Novolog 3 units tidwc.  Will continue to follow. Thank you. Ailene Ards, RD, LDN, CDE Inpatient Diabetes Coordinator [phone]

## 2016-01-25 NOTE — Progress Notes (Addendum)
      [address]       [place] [address]             [phone]        5 Days Post-Op Procedure(s) (LRB): CORONARY ARTERY BYPASS GRAFTING (CABG) times four using left internal mammary artery and right saphenous vein (N/A) TRANSESOPHAGEAL ECHOCARDIOGRAM (TEE) (N/A)  Subjective: Patient with several bowel movements. Has complaints of incisional pain  Objective: Vital signs in last 24 hours: Temp:  [98.4 F (36.9 C)-100.1 F (37.8 C)] 98.7 F (37.1 C) (02/07 0524) Pulse Rate:  [82-89] 82 (02/07 0524) Cardiac Rhythm:  [-] Normal sinus rhythm (02/06 1900) Resp:  [16-18] 18 (02/07 0524) BP: (102-133)/(58-94) 102/58 mmHg (02/07 0524) SpO2:  [95 %-97 %] 97 % (02/07 0524) Weight:  [256 lb 2.8 oz (116.2 kg)] 256 lb 2.8 oz (116.2 kg) (02/07 0524)  Pre op weight 113 kg Current Weight  01/25/16 256 lb 2.8 oz (116.2 kg)      Intake/Output from previous day: 02/06 0701 - 02/07 0700 In: 720 [P.O.:720] Out: 825 [Urine:825]   Physical Exam:  Cardiovascular: IRRR. Pulmonary: Slightly diminished at bases bilaterally; no rales, wheezes, or rhonchi. Abdomen: Soft, non tender, bowel sounds present. Extremities: Bilateral lower extremity edema. Wounds: Sero sanguinous drainage from proximal sternal wound. No cellulitis. RLE wound is clean and dry.  Lab Results: CBC:  Recent Labs  01/23/16 0323  WBC 13.5*  HGB 11.9*  HCT 34.8*  PLT 86*   BMET:   Recent Labs  01/24/16 0430 01/25/16 0339  NA 130* 134*  K 4.6 4.7  CL 95* 96*  CO2 28 27  GLUCOSE 198* 207*  BUN 23* 19  CREATININE 1.25* 1.11  CALCIUM 8.7* 8.9    PT/INR:  Lab Results  Component Value Date   INR 1.34 01/20/2016   INR 1.05 01/20/2016   INR 0.92 01/12/2016   ABG:  INR: Will add last result for INR, ABG once components are confirmed Will add last 4 CBG results once components are confirmed  Assessment/Plan:  1. CV - Slightly tachy/PACs in the low 100's. On Amiodarone 200 mg  bid and Lopressor 25 mg bid. 2.  Pulmonary - On room air. Encourage incentive spirometer and flutter valve. 3. Volume Overload -  Lasix 40 mg daily and will give after discharge. 4.  Acute blood loss anemia - H and H yesterday 11.9 and 34.8 5. Creatinine  decreased to 1.11 6. Hyponatremia-sodium up to 134 likely related to diuresis. 7. Thrombocytopenia-platelets 86,000 8.DM-CBGs 157/196/297. On Insulin. Was on Metformin pre op. Will restart at discharge.Pre op HGA1C 8. Will need follow up with medical doctor after discharge. 9. Had fever to 100.1 yesterday. Has some sero sanguinous oozing from proximal sternal wound. Has some fat necrosis.  Will start IV Vanco. Dressing changes bid and PRN. Of note, he has an ulcer on his right great toe. Will need follow up with podiatrist after discharge.  ZIMMERMAN,DONIELLE MPA-C 01/25/2016,7:32 AM   patient examined and medical record reviewed,agree with above note. Kathlee Nations Trigt III 01/25/2016

## 2016-01-25 NOTE — Progress Notes (Signed)
CARDIAC REHAB PHASE I   PRE:  Rate/Rhythm: 81 SR with PACs    BP: sitting 122/81    SaO2: 96 RA  MODE:  Ambulation: 890 ft   POST:  Rate/Rhythm: 101 ST with PACs    BP: sitting 165/84     SaO2: 98 RA  Tolerated well, moving independently. Only c/o is pain and bed. He has been walking on his own. Will f/u tomorrow. [id number]   Elissa Lovett [place] CES, ACSM 01/25/2016 11:12 AM

## 2016-01-25 NOTE — Progress Notes (Signed)
Wife came and wanted to speak to someone about update in patient progress.  Patient wanted dry mouth addressed, and states he has had difficulty swallowing for years but worse since surgery. Wife states that right toe ulcer looks worse than when he came into hospital.  I cleaned site and re-dressed wound. No drainage, no odor and appears to be healing. I explained reasoning for form dressing and care. Wife states that patient has not been taking care as he should. All questions answered. Wife would like to know about infection and antibiotics given. Pt resting with call bell within reach.  Will continue to monitor. Thomas Hoff, RN

## 2016-01-25 NOTE — Progress Notes (Signed)
ANTIBIOTIC CONSULT NOTE - INITIAL  Pharmacy Consult for Vancomycin Indication: sternal wound infection  Allergies  Allergen Reactions  . Sulfa Antibiotics Other (See Comments)    Childhood     Patient Measurements: Height: 6\' 2"  (188 cm) Weight: 256 lb 2.8 oz (116.2 kg) IBW/kg (Calculated) : 82.2 Adjusted Body Weight:    Vital Signs: Temp: 98.7 F (37.1 C) (02/07 0524) Temp Source: Oral (02/07 0524) BP: 122/81 mmHg (02/07 1045) Pulse Rate: 93 (02/07 1045) Intake/Output from previous day: 02/06 0701 - 02/07 0700 In: 720 [P.O.:720] Out: 825 [Urine:825] Intake/Output from this shift: Total I/O In: 240 [P.O.:240] Out: -   Labs:  Recent Labs  01/23/16 0323 01/24/16 0430 01/25/16 0339  WBC 13.5*  --   --   HGB 11.9*  --   --   PLT 86*  --   --   CREATININE 1.17 1.25* 1.11   Estimated Creatinine Clearance: 107.9 mL/min (by C-G formula based on Cr of 1.11). No results for input(s): VANCOTROUGH, VANCOPEAK, VANCORANDOM, GENTTROUGH, GENTPEAK, GENTRANDOM, TOBRATROUGH, TOBRAPEAK, TOBRARND, AMIKACINPEAK, AMIKACINTROU, AMIKACIN in the last 72 hours.   Microbiolog  Medical History: Past Medical History  Diagnosis Date  . Drug abuse   . Irregular heart beat   . Bipolar 1 disorder (HCC)   . Sleep apnea     DOES NOT USE CPAP  . Diabetes mellitus without complication (HCC)     INSULIN DEPENDENT  . GERD (gastroesophageal reflux disease)     Assessment: CABG 01/20/16.  ID-  Empiric for sternal wound infection. Tmax 100.1. Some oozing noted by PA from sternal wound. Last WBC 13.5. CrCl >100  Vancomycin 2/7>>  Goal of Therapy:  Vancomycin trough level 10-15 mcg/ml  Plan:  Vancomycin 1g IV q12h Vancomycin trough after 3-5 doses at steady state. Please consider adding VTE prophylaxis and increasing Levemir Thanks!   Aritha Huckeba S. 03/19/16, PharmD, BCPS Clinical Staff Pharmacist Pager [phone]  [phone] [place] 01/25/2016,11:22 AM

## 2016-01-25 NOTE — Discharge Summary (Signed)
Physician Discharge Summary       [address] [place]       [place] [address]             [phone]    Patient ID: Austin Arnold MRN: [id number] DOB/AGE: Oct 12, 1966 51 y.o.  Admit date: 01/12/2016 Discharge date: 01/27/2016  Admission Diagnoses: 1. S/p NSTEMI  (non-ST elevated myocardial infarction) (HCC) 2. Multivessel CAD 3. Ischemic cardiomyopathy  4. Right great toe wound (diabetic ulcer)  Discharge Diagnoses:  1. DM (diabetes mellitus), type 2, uncontrolled with complications (HCC) 2.Hyperlipidemia 3. Bipolar 1 disorder (HCC) 4. Superficial proximal sternal wound fat necrosis   Procedure (s):  Left Heart Cath and Coronary Angiography by Dr. Katrinka Blazing on 01/13/2016:    Conclusion    1. Lat Ramus lesion, 65% stenosed. 2. Ost LAD to Mid LAD lesion, 90% stenosed. 3. Prox Cx lesion, 95% stenosed. 4. 1st Mrg lesion, 80% stenosed. 5. There is moderate to severe left ventricular systolic dysfunction.   Left dominant coronary anatomy  Segmental 95% proximal LAD  Focal 95% mid circumflex bifurcation lesion  80% first obtuse marginal  Severe anterior wall and apical hypokinesis with EF 30-35%  RECOMMENDATIONS:   Discontinue Brilinta  TCTS consultation for bypass surgery  Continue IV nitroglycerin and IV heparin. Continue aspirin and beta blocker therapy.  Urgent surgery if recurrent prolonged chest pain on medical therapy    2. Coronary artery bypass grafting x4 (left internal mammary artery to left anterior descending coronary artery, saphenous vein graft to ramus intermediate, sequential saphenous vein graft to obtuse marginal continuing to the posterior lateral branch of the distal circumflex). 2. Endoscopic harvest of right leg greater saphenous vein by Dr. Donata Clay on 01/20/2016.  Consultant: wound care  History of Presenting Illness: This is a 43 Caucasian male with T2DMx12 years, dyslipidemia, and possible RA (on prednisone x few  months as was not able to tolerate some other meds before), prior history of alcohol and illicit drugs including cocaine, active tobacco smoker (cigars), who presented with chest pain. he reported intermittent mid-sternal and epigastric pressure and ache for the past couple months associated with nausea, sweating over head and shortness of breath. He tried OTC antiacids. Today, symptoms were severe and lasted over 3 hours before he presented to ER. No prior history of CAD, MI, HF, CVA, cancer, bleeding.  Does not recall stress test or heart cath in the past. He reports having abnormal rhythm in the past, but never saw a cardiologist. He denies any recent use of illicit drugs.   He reported trauma to left leg requiring coiling of one artery in his thigh some years ago.   In the ER , his POC TnI was 0.8 and lab TnI is 1.19, CBC and renal function within normal limits. ECG: NSR, PVCs. Normal AV conduction, left axis, no ST elevation He underwent a cardiac catheterization by Dr. Katrinka Blazing on 01/13/2016. He had multivessel CAD with LVEF 30-35%. A cardiothoracic consultation was obtained with Dr. Donata Clay. Dr. Donata Clay discussed the need for coronary artery bypass surgery. Potential risks, benefits, and complications were discussed with the patient and he agreed to proceed with surgery. Pre operative carotid duplex US showed no significant internal carotid artery stenosis bilaterally. He underwent a CABG x 4 on 01/20/2016.  Brief Hospital Course:  The patient was extubated the evening of surgery without difficulty. He remained afebrile and hemodynamically stable. He was weaned off of Dopamine drip. Theone Murdoch, a line, chest tubes, and foley were  removed early in the post operative course. Lopressor was started and titrated accordingly. He had PACs post op but no a fib. He was started on Amiodarone. He was volume over loaded and diuresed. He had ABL anemia. He did not require a post op transfusion. His last H and H  was 11.2 and 34.4. He also had thrombocytopenia. This did resolve as his last platelet count was up to 193,000.  He was weaned off the insulin drip. . The patient's HGA1C pre op was 8. His glucose was controlled with Insulin. His creatinine was elevated post op so he was not restarted on Metformin until discharge. The patient's glucose remained well controlled. The patient was felt surgically stable for transfer from the ICU to PCTU for further convalescence on 01/23/2016. He developed a fever to 100.1 and sero sanguinous drainage from his proximal sternal incision. There was no cellulitis. He likely had fat necrosis. He was put on IV Vanco. Last WBC 10,100. He has been afebrile the last 24 hours and has no more sero sanguinous drainage. He will be put on Keflex 500 mg tid orally for one week.He continues to progress with cardiac rehab. He initially required a several liters of oxygen via [place] but was later weaned to room air. He has been tolerating a diet and has had a bowel movement. Epicardial pacing wires and chest tube sutures will be removed prior to discharge. The patient is felt surgically stable for discharge today.   Latest Vital Signs: Blood pressure 104/76, pulse 77, temperature 98.3 F (36.8 C), temperature source Oral, resp. rate 18, height 6\' 2"  (1.88 m), weight 256 lb 6.4 oz (116.302 kg), SpO2 97 %.  Physical Exam: Cardiovascular: IRRR. Pulmonary: Slightly diminished at bases bilaterally; no rales, wheezes, or rhonchi. Abdomen: Soft, non tender, bowel sounds present. Extremities: Bilateral lower extremity edema. Wounds: Sero sanguinous drainage from proximal sternal wound. No cellulitis. RLE wound is clean and dry.  Discharge Condition: Stable and discharged to home  Recent laboratory studies:  Lab Results  Component Value Date   WBC 10.1 01/26/2016   HGB 11.2* 01/26/2016   HCT 34.4* 01/26/2016   MCV 90.1 01/26/2016   PLT 193 01/26/2016   Lab Results  Component Value Date    NA 134* 01/25/2016   K 4.7 01/25/2016   CL 96* 01/25/2016   CO2 27 01/25/2016   CREATININE 1.11 01/25/2016   GLUCOSE 207* 01/25/2016    Diagnostic Studies: Dg Chest 2 View  01/23/2016  CLINICAL DATA:  Post op CABG x 4 days ago EXAM: CHEST  2 VIEW COMPARISON:  01/22/2016 FINDINGS: Interval retraction of RIGHT IJ sheath. Stable cardiac silhouette. There is LEFT basilar atelectasis and small effusion unchanged. RIGHT lung is clear. No pneumothorax. IMPRESSION: Persistent LEFT basilar atelectasis small effusion Electronically Signed   By: 03/21/2016      Discharge Instructions    AMB Referral to [facility] Care Management    Complete by:  As directed   Reason for consult:  Post hospital follow up new medications  Expected date of contact:  1-3 days (reserved for hospital discharges)           Discharge Medications:   Medication List    STOP taking these medications        naproxen 500 MG tablet  Commonly known as:  NAPROSYN      TAKE these medications        acetaminophen 325 MG tablet  Commonly known as:  TYLENOL  Take 2  tablets (650 mg total) by mouth every 4 (four) hours as needed for fever or headache.     amiodarone 200 MG tablet  Commonly known as:  PACERONE  Take 1 tablet (200 mg total) by mouth 2 (two) times daily. For 5 days then take Amiodarone 200 mg daily by mouth thereafter     aspirin 325 MG EC tablet  Take 1 tablet (325 mg total) by mouth daily.     atorvastatin 80 MG tablet  Commonly known as:  LIPITOR  Take 1 tablet (80 mg total) by mouth daily at 6 PM.     cephALEXin 500 MG capsule  Commonly known as:  KEFLEX  Take 1 capsule (500 mg total) by mouth 3 (three) times daily.     furosemide 40 MG tablet  Commonly known as:  LASIX  Take 1 tablet (40 mg total) by mouth daily. For one week then stop.     lithium 600 MG capsule  Take 600 mg by mouth daily.     metFORMIN 500 MG 24 hr tablet  Commonly known as:  GLUCOPHAGE-XR  Take 1,000 mg by mouth 2  (two) times daily.     metoprolol tartrate 25 MG tablet  Commonly known as:  LOPRESSOR  Take 1 tablet (25 mg total) by mouth 2 (two) times daily.     potassium chloride SA 20 MEQ tablet  Commonly known as:  K-DUR,KLOR-CON  Take 1 tablet (20 mEq total) by mouth daily.     prednisoLONE 5 MG Tabs tablet  Take 5 mg by mouth at bedtime.     TOUJEO SOLOSTAR 300 UNIT/ML Sopn  Generic drug:  Insulin Glargine  Inject 48 Units into the skin daily.     traMADol 50 MG tablet  Commonly known as:  ULTRAM  Take 1-2 tablets (50-100 mg total) by mouth every 4 (four) hours as needed for moderate pain.     traZODone 100 MG tablet  Commonly known as:  DESYREL  Take 100 mg by mouth daily.       The patient has been discharged on:   1.Beta Blocker:  Yes [ x  ]                              No   [   ]                              If No, reason:  2.Ace Inhibitor/ARB: Yes [   ]                                     No  [  x  ]                                     If No, reason:Labile blood pressure  3.Statin:   Yes [ x  ]                  No  [   ]                  If No, reason:  4.Ecasa:  Yes  [ x  ]  No   [   ]                  If No, reason:  Follow Up Appointments: Follow-up Information    Follow up with Kerin Perna III, MD On 03/01/2016.   Specialty:  Cardiothoracic Surgery   Why:  PA/LAT CXR to be taken (at [facility] which is in the same building as Dr. Zenaida Niece Trigt's office) on 02/09/2016 at 12:45 pm;Appointment time is at 1:30 pm   Contact information:   [address] [employer] [address] [place] [address] [phone]       Follow up with Wilburt Finlay, PA-C On 02/07/2016.   Specialties:  Physician Assistant, Radiology, Interventional Cardiology   Why:  Appointment time is at 2:00 pm   Contact information:   [address] [place] [address] [phone]       Follow up with [facility], MD.   Specialty:  Internal Medicine    Why:  Call for a follow up appointment regarding further diabetes management and HGA1C 8   Contact information:   [address] [place] [address] [place] [address] [phone]       Follow up with Podiatrist.   Why:  Please obtain a podiatrist for further evaluation of right great toe foot ulcer      Signed: Doniqua Saxby MPA-C 01/27/2016, 9:42 AM

## 2016-01-26 DIAGNOSIS — I25118 Atherosclerotic heart disease of native coronary artery with other forms of angina pectoris: Secondary | ICD-10-CM

## 2016-01-26 LAB — GLUCOSE, CAPILLARY
GLUCOSE-CAPILLARY: 257 mg/dL — AB (ref 65–99)
GLUCOSE-CAPILLARY: 100 mg/dL — AB (ref 65–99)
Glucose-Capillary: 250 mg/dL — ABNORMAL HIGH (ref 65–99)
Glucose-Capillary: 120 mg/dL — ABNORMAL HIGH (ref 65–99)

## 2016-01-26 LAB — CBC
HCT: 34.4 % — ABNORMAL LOW (ref 39.0–52.0)
HEMOGLOBIN: 11.2 g/dL — AB (ref 13.0–17.0)
MCH: 29.3 pg (ref 26.0–34.0)
MCHC: 32.6 g/dL (ref 30.0–36.0)
MCV: 90.1 fL (ref 78.0–100.0)
PLATELETS: 193 10*3/uL (ref 150–400)
RBC: 3.82 MIL/uL — ABNORMAL LOW (ref 4.22–5.81)
RDW: 13.1 % (ref 11.5–15.5)
WBC: 10.1 10*3/uL (ref 4.0–10.5)

## 2016-01-26 MED ORDER — METFORMIN HCL 500 MG PO TABS
1000.0000 mg | ORAL_TABLET | Freq: Two times a day (BID) | ORAL | Status: DC
  Administered 2016-01-26 – 2016-01-27 (×3): 1000 mg via ORAL
  Filled 2016-01-26 (×3): qty 2

## 2016-01-26 NOTE — Progress Notes (Signed)
Utilization review completed.  

## 2016-01-26 NOTE — Progress Notes (Addendum)
      [address]       [place] [address]             [phone]        6 Days Post-Op Procedure(s) (LRB): CORONARY ARTERY BYPASS GRAFTING (CABG) times four using left internal mammary artery and right saphenous vein (N/A) TRANSESOPHAGEAL ECHOCARDIOGRAM (TEE) (N/A)  Subjective: Patient really wants to go home.  Objective: Vital signs in last 24 hours: Temp:  [97.8 F (36.6 C)-99.9 F (37.7 C)] 97.8 F (36.6 C) (02/08 0347) Pulse Rate:  [77-95] 80 (02/08 0347) Cardiac Rhythm:  [-] Normal sinus rhythm (02/07 1906) Resp:  [18-20] 18 (02/08 0347) BP: (104-133)/(63-81) 133/81 mmHg (02/08 0347) SpO2:  [96 %-98 %] 98 % (02/08 0347) Weight:  [256 lb 8 oz (116.348 kg)] 256 lb 8 oz (116.348 kg) (02/08 0347)  Pre op weight 113 kg Current Weight  01/26/16 256 lb 8 oz (116.348 kg)      Intake/Output from previous day: 02/07 0701 - 02/08 0700 In: 960 [P.O.:960] Out: 975 [Urine:975]   Physical Exam:  Cardiovascular: RRR. Pulmonary: Slightly diminished at bases bilaterally; no rales, wheezes, or rhonchi. Abdomen: Soft, non tender, bowel sounds present. Extremities: Bilateral lower extremity edema, but decreasing. Wounds: Minor sero sanguinous drainage from proximal sternal wound. No cellulitis. RLE wound is clean and dry. Right great toe wound without drainage or cellulitis  Lab Results: CBC:  Recent Labs  01/26/16 0545  WBC 10.1  HGB 11.2*  HCT 34.4*  PLT 193   BMET:   Recent Labs  01/24/16 0430 01/25/16 0339  NA 130* 134*  K 4.6 4.7  CL 95* 96*  CO2 28 27  GLUCOSE 198* 207*  BUN 23* 19  CREATININE 1.25* 1.11  CALCIUM 8.7* 8.9    PT/INR:  Lab Results  Component Value Date   INR 1.34 01/20/2016   INR 1.05 01/20/2016   INR 0.92 01/12/2016   ABG:  INR: Will add last result for INR, ABG once components are confirmed Will add last 4 CBG results once components are confirmed  Assessment/Plan:  1. CV - Slightly tachy/PACs in the  low 100's. On Amiodarone 200 mg bid and Lopressor 25 mg bid. 2.  Pulmonary - On room air. Encourage incentive spirometer and flutter valve. 3. Volume Overload -  Lasix 40 mg daily and will give after discharge. 4.  Acute blood loss anemia - H and H  11.2 and 34.4 6. Hyponatremia-sodium up to 134 likely related to diuresis. 7. Thrombocytopenia resolved -platelets up to 193,000 8.DM-CBGs 159/153/257. On Insulin. Was on Metformin pre op. Will restart today. .Pre op HGA1C 8. Will need follow up with medical doctor after discharge. 9. Had fever to 99.9 last night. Has some sero sanguinous oozing from proximal sternal wound. Likely has some fat necrosis. On IV Vanco. Dressing changes bid and PRN. Of note, he has an ulcer on his right great toe. Will need follow up with podiatrist after discharge.  ZIMMERMAN,DONIELLE MPA-C 01/26/2016,7:44 AM  Continue iv vanco and wound care in hospital patient examined and medical record reviewed,agree with above note. Kathlee Nations Trigt III 01/26/2016

## 2016-01-26 NOTE — Progress Notes (Signed)
CARDIAC REHAB PHASE I   PRE:  Rate/Rhythm: 80 SR  BP:  Sitting: 125/71        SaO2: 95 RA  MODE:  Ambulation: 900 ft   POST:  Rate/Rhythm: 89 SR  BP:  Sitting: 138/79         SaO2: 98 RA  Pt sitting in recliner, very tearful. States she feels "a little depressed" and wants to go home. He misses his family, his coworkers and his dogs. Emotional support given to pt. Pt agreeable to walk. Pt ambulated 900 ft on RA, IV, assist x1, steady gait, tolerated well. Pt c/o increased incisional pain with activity, denies any other complaints. Pt to recliner after walk, feet elevated, call bell within reach. Encouraged additional ambulation x2 today, IS. Pt verbalized understanding. Will continue to follow.    [phone] Joylene Grapes, RN, BSN 01/26/2016 11:11 AM

## 2016-01-27 LAB — GLUCOSE, CAPILLARY: GLUCOSE-CAPILLARY: 178 mg/dL — AB (ref 65–99)

## 2016-01-27 MED ORDER — CEPHALEXIN 500 MG PO CAPS: 500.0000 mg | ORAL_CAPSULE | Freq: Three times a day (TID) | ORAL | Status: DC

## 2016-01-27 MED ORDER — TRAMADOL HCL 50 MG PO TABS: 50.0000 mg | ORAL_TABLET | ORAL | Status: DC | PRN

## 2016-01-27 MED ORDER — ACETAMINOPHEN 325 MG PO TABS: 650.0000 mg | ORAL_TABLET | ORAL | Status: DC | PRN

## 2016-01-27 MED ORDER — ATORVASTATIN CALCIUM 80 MG PO TABS: 80.0000 mg | ORAL_TABLET | Freq: Every day | ORAL | Status: DC

## 2016-01-27 MED ORDER — POTASSIUM CHLORIDE CRYS ER 20 MEQ PO TBCR: 20.0000 meq | EXTENDED_RELEASE_TABLET | Freq: Every day | ORAL | Status: DC

## 2016-01-27 MED ORDER — METOPROLOL TARTRATE 25 MG PO TABS: 25.0000 mg | ORAL_TABLET | Freq: Two times a day (BID) | ORAL | Status: DC

## 2016-01-27 MED ORDER — ASPIRIN 325 MG PO TBEC: 325.0000 mg | DELAYED_RELEASE_TABLET | Freq: Every day | ORAL | Status: DC

## 2016-01-27 MED ORDER — AMIODARONE HCL 200 MG PO TABS: 200.0000 mg | ORAL_TABLET | Freq: Two times a day (BID) | ORAL | Status: DC

## 2016-01-27 MED ORDER — FUROSEMIDE 40 MG PO TABS: 40.0000 mg | ORAL_TABLET | Freq: Every day | ORAL | Status: DC

## 2016-01-27 NOTE — Progress Notes (Addendum)
      [address]       [place] [address]             [phone]        7 Days Post-Op Procedure(s) (LRB): CORONARY ARTERY BYPASS GRAFTING (CABG) times four using left internal mammary artery and right saphenous vein (N/A) TRANSESOPHAGEAL ECHOCARDIOGRAM (TEE) (N/A)  Subjective: Patient feeling better and really wants to go home.  Objective: Vital signs in last 24 hours: Temp:  [98.3 F (36.8 C)-98.8 F (37.1 C)] 98.3 F (36.8 C) (02/09 0534) Pulse Rate:  [77-91] 77 (02/09 0534) Cardiac Rhythm:  [-]  Resp:  [18-19] 18 (02/09 0534) BP: (104-113)/(57-76) 104/76 mmHg (02/09 0534) SpO2:  [96 %-99 %] 97 % (02/09 0534) Weight:  [256 lb 6.4 oz (116.302 kg)] 256 lb 6.4 oz (116.302 kg) (02/09 0634)  Pre op weight 113 kg Current Weight  01/27/16 256 lb 6.4 oz (116.302 kg)      Intake/Output from previous day: 02/08 0701 - 02/09 0700 In: 720 [P.O.:720] Out: -    Physical Exam:  Cardiovascular: RRR. Pulmonary: Slightly diminished at bases bilaterally; no rales, wheezes, or rhonchi. Abdomen: Soft, non tender, bowel sounds present. Extremities: Bilateral lower extremity edema, but decreasing. Wounds: No drainage from proximal sternal wound. No cellulitis. RLE wound is clean and dry. Right great toe wound without drainage or cellulitis  Lab Results: CBC:  Recent Labs  01/26/16 0545  WBC 10.1  HGB 11.2*  HCT 34.4*  PLT 193   BMET:   Recent Labs  01/25/16 0339  NA 134*  K 4.7  CL 96*  CO2 27  GLUCOSE 207*  BUN 19  CREATININE 1.11  CALCIUM 8.9    PT/INR:  Lab Results  Component Value Date   INR 1.34 01/20/2016   INR 1.05 01/20/2016   INR 0.92 01/12/2016   ABG:  INR: Will add last result for INR, ABG once components are confirmed Will add last 4 CBG results once components are confirmed  Assessment/Plan:  1. CV - Slightly tachy/PACs in the low 100's. On Amiodarone 200 mg bid and Lopressor 25 mg bid. 2.  Pulmonary - On room  air. Encourage incentive spirometer and flutter valve. 3. Volume Overload -  Lasix 40 mg daily and will give after discharge. 4.  Acute blood loss anemia - H and H  11.2 and 34.4 6. Hyponatremia-sodium up to 134 likely related to diuresis. 7. Thrombocytopenia resolved -platelets up to 193,000 8.DM-CBGs 120/100/178. On Insulin and Metformin. Will restart today. Pre op HGA1C 8. Will need follow up with medical doctor after discharge. 9. No more fever. No sero sanguinous oozing from proximal sternal wound. Likely has some fat necrosis. On IV Vanco. Dressing changes bid and PRN. Of note, he has an ulcer on his right great toe. Will need follow up with podiatrist after discharge. 10. As discussed  with Dr. Donata Clay, will discharge on oral antiobiotic  ZIMMERMAN,DONIELLE MPA-C 01/27/2016,8:07 AM   patient examined and medical record reviewed,agree with above note. Kathlee Nations Trigt III 01/27/2016

## 2016-01-27 NOTE — Care Management Important Message (Signed)
Important Message  Patient Details  Name: Austin Arnold MRN: [id number] Date of Birth: 1966-02-18   Medicare Important Message Given:  Yes    Bernadette Hoit 01/27/2016, 8:10 AM

## 2016-01-27 NOTE — Progress Notes (Signed)
[id number] Education completed with pt who voiced understanding. Discussed with pt A1C of 8.2. He stated it usually is much better and he knows how to count carbs. Gave pt heart healthy and diabetic diets. Encouraged flutter valve and IS. Discussed smoking cessation and gave handout. Pt plans to quit cigars. Discussed CRP 2 and referring to [facility] program. Has seen discharge video. Luetta Nutting RN BSN 01/27/2016 10:43 AM

## 2016-01-27 NOTE — Progress Notes (Signed)
Order received to discharge.  Telemetry removed and CCMD notified.  Sutures to abdomen removed and covered with steristrips.  Pt tol well.  IV removed with catheter intact.  Discharge education given to Pt.  Confirmed with pharmacy Pt to take amiodarone 200 mg BID today, then start daily dose tomorrow 01/27/2015.  Pt indicates understanding of discharge instruction.  Pt denies chest pain or sob.  Pt stable at this time.  Pt awaiting ride home.  Will cont to monitor.

## 2016-01-27 NOTE — Care Management Note (Signed)
Case Management Note Donn Pierini RN, BSN Unit 2W-Case Manager [phone]  Patient Details  Name: Austin Arnold MRN: [id number] Date of Birth: 30-Oct-1966  Subjective/Objective:    Pt admitted with NSTEMI, cath showed severe multi vessel dx- plan for CABG possibly Thur 01/20/16              Updated Action Plan:  Pt plans to return home with wife post discharge, wife will provide recommended supervision post discharge.  CM will continue to monitor for disposition needs  Action/Plan: PTA pt lived at home with spouse- anticipate return home with spouse- CM will follow post op for pt progression and d/c needs  Expected Discharge Date:     01/27/16             Expected Discharge Plan:  Home/Self Care  In-House Referral:     Discharge planning Services  CM Consult  Post Acute Care Choice:    Choice offered to:     DME Arranged:    DME Agency:     HH Arranged:    HH Agency:     Status of Service:  Completed, signed off  Medicare Important Message Given:  Yes Date Medicare IM Given:    Medicare IM give by:    Date Additional Medicare IM Given:    Additional Medicare Important Message give by:     If discussed at Long Length of Stay Meetings, dates discussed:  01/18/16, 01/25/16, 01/27/16  Discharge Disposition: Home/self care   Additional Comments:  01/27/16- pt for d/c home today- no CM needs identified- to return home with wife.   Darrold Span, RN 01/27/2016, 9:40 AM

## 2016-01-27 NOTE — Progress Notes (Signed)
Pt discharged with wife via WC.  Pt stable at discharge, no s/s of distress.

## 2016-01-28 ENCOUNTER — Other Ambulatory Visit: Payer: Self-pay | Admitting: *Deleted

## 2016-01-28 ENCOUNTER — Telehealth: Payer: Self-pay | Admitting: *Deleted

## 2016-01-28 ENCOUNTER — Other Ambulatory Visit: Payer: Self-pay

## 2016-01-28 DIAGNOSIS — R110 Nausea: Secondary | ICD-10-CM

## 2016-01-28 MED ORDER — PROMETHAZINE HCL 12.5 MG PO TABS: 12.5000 mg | ORAL_TABLET | Freq: Four times a day (QID) | ORAL | Status: DC | PRN

## 2016-01-28 NOTE — Telephone Encounter (Signed)
Austin Arnold has called to relate that after being in the bed all night, Austin Arnold has some sternal drainage at the proximal portion of the sternal incision.  He had some drainage before discharge and is presently on Keflex.  The drainage is cleat yellow/orange in color but no frank pus. There is only a small amount.  I reassured her, but asked that she notify us if the drainage increases, the incision becomes reddened and inflammed or he has a fever again.  She agreed.

## 2016-01-28 NOTE — Patient Outreach (Signed)
Transition of care call #1 - Completed. Pt is doing well. See transition of care template. I will take to pt in one week for follow up. He has my number and our nurse line number.  [facility] Care Manager [phone]

## 2016-01-29 ENCOUNTER — Encounter (HOSPITAL_COMMUNITY): Payer: Self-pay | Admitting: Emergency Medicine

## 2016-01-29 ENCOUNTER — Emergency Department (HOSPITAL_COMMUNITY): Payer: Medicare Other

## 2016-01-29 ENCOUNTER — Observation Stay (HOSPITAL_COMMUNITY)
Admission: EM | Admit: 2016-01-29 | Discharge: 2016-01-30 | Disposition: A | Discharge status: Home / Self Care | Payer: Medicare Other | Attending: Cardiovascular Disease | Admitting: Cardiovascular Disease

## 2016-01-29 DIAGNOSIS — I255 Ischemic cardiomyopathy: Secondary | ICD-10-CM | POA: Diagnosis not present

## 2016-01-29 DIAGNOSIS — Z7952 Long term (current) use of systemic steroids: Secondary | ICD-10-CM | POA: Diagnosis not present

## 2016-01-29 DIAGNOSIS — I252 Old myocardial infarction: Secondary | ICD-10-CM | POA: Insufficient documentation

## 2016-01-29 DIAGNOSIS — G4730 Sleep apnea, unspecified: Secondary | ICD-10-CM | POA: Insufficient documentation

## 2016-01-29 DIAGNOSIS — I2510 Atherosclerotic heart disease of native coronary artery without angina pectoris: Secondary | ICD-10-CM | POA: Insufficient documentation

## 2016-01-29 DIAGNOSIS — R0789 Other chest pain: Secondary | ICD-10-CM | POA: Diagnosis not present

## 2016-01-29 DIAGNOSIS — R079 Chest pain, unspecified: Secondary | ICD-10-CM | POA: Diagnosis not present

## 2016-01-29 DIAGNOSIS — F319 Bipolar disorder, unspecified: Secondary | ICD-10-CM | POA: Diagnosis not present

## 2016-01-29 DIAGNOSIS — Z8249 Family history of ischemic heart disease and other diseases of the circulatory system: Secondary | ICD-10-CM | POA: Insufficient documentation

## 2016-01-29 DIAGNOSIS — Z794 Long term (current) use of insulin: Secondary | ICD-10-CM | POA: Insufficient documentation

## 2016-01-29 DIAGNOSIS — I517 Cardiomegaly: Secondary | ICD-10-CM | POA: Diagnosis not present

## 2016-01-29 DIAGNOSIS — I25118 Atherosclerotic heart disease of native coronary artery with other forms of angina pectoris: Secondary | ICD-10-CM | POA: Diagnosis not present

## 2016-01-29 DIAGNOSIS — R072 Precordial pain: Secondary | ICD-10-CM | POA: Diagnosis not present

## 2016-01-29 DIAGNOSIS — K219 Gastro-esophageal reflux disease without esophagitis: Secondary | ICD-10-CM | POA: Diagnosis not present

## 2016-01-29 DIAGNOSIS — L97519 Non-pressure chronic ulcer of other part of right foot with unspecified severity: Secondary | ICD-10-CM | POA: Insufficient documentation

## 2016-01-29 DIAGNOSIS — F17210 Nicotine dependence, cigarettes, uncomplicated: Secondary | ICD-10-CM | POA: Diagnosis not present

## 2016-01-29 DIAGNOSIS — F17290 Nicotine dependence, other tobacco product, uncomplicated: Secondary | ICD-10-CM | POA: Diagnosis not present

## 2016-01-29 DIAGNOSIS — IMO0002 Reserved for concepts with insufficient information to code with codable children: Secondary | ICD-10-CM | POA: Diagnosis present

## 2016-01-29 DIAGNOSIS — Z951 Presence of aortocoronary bypass graft: Secondary | ICD-10-CM | POA: Insufficient documentation

## 2016-01-29 DIAGNOSIS — E785 Hyperlipidemia, unspecified: Secondary | ICD-10-CM | POA: Diagnosis not present

## 2016-01-29 DIAGNOSIS — E11621 Type 2 diabetes mellitus with foot ulcer: Secondary | ICD-10-CM | POA: Insufficient documentation

## 2016-01-29 DIAGNOSIS — E118 Type 2 diabetes mellitus with unspecified complications: Secondary | ICD-10-CM

## 2016-01-29 DIAGNOSIS — J948 Other specified pleural conditions: Secondary | ICD-10-CM

## 2016-01-29 DIAGNOSIS — E1165 Type 2 diabetes mellitus with hyperglycemia: Secondary | ICD-10-CM

## 2016-01-29 DIAGNOSIS — Z79899 Other long term (current) drug therapy: Secondary | ICD-10-CM | POA: Diagnosis not present

## 2016-01-29 DIAGNOSIS — Z7982 Long term (current) use of aspirin: Secondary | ICD-10-CM | POA: Insufficient documentation

## 2016-01-29 DIAGNOSIS — J90 Pleural effusion, not elsewhere classified: Secondary | ICD-10-CM | POA: Diagnosis not present

## 2016-01-29 DIAGNOSIS — R0602 Shortness of breath: Secondary | ICD-10-CM | POA: Diagnosis not present

## 2016-01-29 DIAGNOSIS — I214 Non-ST elevation (NSTEMI) myocardial infarction: Secondary | ICD-10-CM | POA: Diagnosis present

## 2016-01-29 DIAGNOSIS — R0600 Dyspnea, unspecified: Secondary | ICD-10-CM

## 2016-01-29 LAB — CBC
HCT: 39.6 % (ref 39.0–52.0)
HCT: 38 % — ABNORMAL LOW (ref 39.0–52.0)
HEMOGLOBIN: 12.4 g/dL — AB (ref 13.0–17.0)
Hemoglobin: 12.9 g/dL — ABNORMAL LOW (ref 13.0–17.0)
MCH: 28.7 pg (ref 26.0–34.0)
MCH: 28.9 pg (ref 26.0–34.0)
MCHC: 32.6 g/dL (ref 30.0–36.0)
MCHC: 32.6 g/dL (ref 30.0–36.0)
MCV: 88 fL (ref 78.0–100.0)
MCV: 88.6 fL (ref 78.0–100.0)
PLATELETS: 380 10*3/uL (ref 150–400)
Platelets: 359 10*3/uL (ref 150–400)
RBC: 4.5 MIL/uL (ref 4.22–5.81)
RBC: 4.29 MIL/uL (ref 4.22–5.81)
RDW: 13 % (ref 11.5–15.5)
RDW: 13 % (ref 11.5–15.5)
WBC: 11 10*3/uL — AB (ref 4.0–10.5)
WBC: 10.6 10*3/uL — AB (ref 4.0–10.5)

## 2016-01-29 LAB — I-STAT TROPONIN, ED
TROPONIN I, POC: 0.08 ng/mL (ref 0.00–0.08)
Troponin i, poc: 0.12 ng/mL (ref 0.00–0.08)

## 2016-01-29 LAB — TROPONIN I: TROPONIN I: 0.21 ng/mL — AB (ref <0.031)

## 2016-01-29 LAB — MAGNESIUM: Magnesium: 1.9 mg/dL (ref 1.7–2.4)

## 2016-01-29 LAB — BASIC METABOLIC PANEL
Anion gap: 12 (ref 5–15)
BUN: 21 mg/dL — AB (ref 6–20)
CHLORIDE: 95 mmol/L — AB (ref 101–111)
CO2: 28 mmol/L (ref 22–32)
CREATININE: 1.14 mg/dL (ref 0.61–1.24)
Calcium: 9.3 mg/dL (ref 8.9–10.3)
GFR calc Af Amer: >60 mL/min (ref >60)
GFR calc non Af Amer: >60 mL/min (ref >60)
Glucose, Bld: 128 mg/dL — ABNORMAL HIGH (ref 65–99)
Potassium: 4.3 mmol/L (ref 3.5–5.1)
SODIUM: 135 mmol/L (ref 135–145)

## 2016-01-29 LAB — CREATININE, SERUM: CREATININE: 1.25 mg/dL — AB (ref 0.61–1.24)

## 2016-01-29 MED ORDER — METOPROLOL TARTRATE 25 MG PO TABS
25.0000 mg | ORAL_TABLET | Freq: Two times a day (BID) | ORAL | Status: DC
  Administered 2016-01-29 – 2016-01-30 (×2): 25 mg via ORAL
  Filled 2016-01-29 (×2): qty 1

## 2016-01-29 MED ORDER — PROMETHAZINE HCL 25 MG PO TABS: 12.5000 mg | ORAL_TABLET | Freq: Four times a day (QID) | ORAL | Status: DC | PRN

## 2016-01-29 MED ORDER — PREDNISOLONE 5 MG PO TABS
5.0000 mg | ORAL_TABLET | Freq: Every day | ORAL | Status: DC
  Administered 2016-01-29: 5 mg via ORAL
  Filled 2016-01-29 (×2): qty 1

## 2016-01-29 MED ORDER — NITROGLYCERIN 2 % TD OINT
1.0000 [in_us] | TOPICAL_OINTMENT | Freq: Once | TRANSDERMAL | Status: AC
  Administered 2016-01-29: 1 [in_us] via TOPICAL
  Filled 2016-01-29: qty 1

## 2016-01-29 MED ORDER — TRAMADOL HCL 50 MG PO TABS
50.0000 mg | ORAL_TABLET | ORAL | Status: DC | PRN
Start: 2016-01-29 — End: 2016-01-30

## 2016-01-29 MED ORDER — METFORMIN HCL ER 500 MG PO TB24
1000.0000 mg | ORAL_TABLET | Freq: Two times a day (BID) | ORAL | Status: DC
  Administered 2016-01-29 – 2016-01-30 (×2): 1000 mg via ORAL
  Filled 2016-01-29 (×2): qty 2

## 2016-01-29 MED ORDER — HEPARIN SODIUM (PORCINE) 5000 UNIT/ML IJ SOLN
5000.0000 [IU] | Freq: Three times a day (TID) | INTRAMUSCULAR | Status: DC
  Administered 2016-01-29 – 2016-01-30 (×2): 5000 [IU] via SUBCUTANEOUS
  Filled 2016-01-29 (×2): qty 1

## 2016-01-29 MED ORDER — INSULIN GLARGINE 100 UNIT/ML ~~LOC~~ SOLN
48.0000 [IU] | Freq: Every day | SUBCUTANEOUS | Status: DC
  Administered 2016-01-30: 48 [IU] via SUBCUTANEOUS
  Filled 2016-01-29: qty 0.48

## 2016-01-29 MED ORDER — TRAZODONE HCL 100 MG PO TABS
100.0000 mg | ORAL_TABLET | Freq: Every day | ORAL | Status: DC
  Administered 2016-01-29: 100 mg via ORAL
  Filled 2016-01-29: qty 1

## 2016-01-29 MED ORDER — ONDANSETRON HCL 4 MG/2ML IJ SOLN: 4.0000 mg | Freq: Four times a day (QID) | INTRAMUSCULAR | Status: DC | PRN

## 2016-01-29 MED ORDER — ATORVASTATIN CALCIUM 80 MG PO TABS
80.0000 mg | ORAL_TABLET | Freq: Every day | ORAL | Status: DC
  Administered 2016-01-29: 80 mg via ORAL
  Filled 2016-01-29: qty 1

## 2016-01-29 MED ORDER — ACETAMINOPHEN 325 MG PO TABS
650.0000 mg | ORAL_TABLET | ORAL | Status: DC | PRN
  Administered 2016-01-30: 650 mg via ORAL
  Filled 2016-01-29: qty 2

## 2016-01-29 MED ORDER — LITHIUM CARBONATE 300 MG PO CAPS
600.0000 mg | ORAL_CAPSULE | Freq: Every day | ORAL | Status: DC
  Administered 2016-01-30: 600 mg via ORAL
  Filled 2016-01-29: qty 2

## 2016-01-29 MED ORDER — INSULIN GLARGINE 300 UNIT/ML ~~LOC~~ SOPN: 48.0000 [IU] | PEN_INJECTOR | Freq: Every day | SUBCUTANEOUS | Status: DC

## 2016-01-29 MED ORDER — ASPIRIN EC 81 MG PO TBEC
81.0000 mg | DELAYED_RELEASE_TABLET | Freq: Every day | ORAL | Status: DC
  Administered 2016-01-30: 81 mg via ORAL
  Filled 2016-01-29: qty 1

## 2016-01-29 MED ORDER — CEPHALEXIN 500 MG PO CAPS
500.0000 mg | ORAL_CAPSULE | Freq: Three times a day (TID) | ORAL | Status: DC
  Administered 2016-01-29 – 2016-01-30 (×3): 500 mg via ORAL
  Filled 2016-01-29 (×3): qty 1

## 2016-01-29 MED ORDER — NITROGLYCERIN 0.4 MG SL SUBL: 0.4000 mg | SUBLINGUAL_TABLET | SUBLINGUAL | Status: DC | PRN

## 2016-01-29 NOTE — H&P (Signed)
Chief Complaint:  Shortness of breath and sharp chest pain  HPI:  This is a 50 y.o. male with a past medical history significant for coronary artery disease status post recent acute myocardial infarction (January 25, NSTEMI) and four-vessel bypass surgery on February 2nd, discharged February 7th, returns with complaints of difficulty catching his breath, diaphoresis, stabbing chest pain which he describes a similar to his pre-bypass angina, copious clear fluid drainage from his sternotomy wound. The shortness of breath is rather unusual. He states that he has periods of 5-10 seconds at a time when he cannot catch his breath. His wife reports witnessing apnea when he is awake. He does very diagnosis of sleep apnea and has not been using CPAP. He denies fever, shaking chills, frank purulent discharge or bleeding from his sternal wound, pain on deep inspiration or cough, leg edema, palpitations or syncope. He is taking tramadol for incisional site pain. 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 He is taking antibiotics since he had low-grade fever and serosanguineous drainage from his proximal sternal incision felt to be secondary to fat necrosis. He is still taking amiodarone. Don't think he actually had atrial fibrillation but had very frequent PACs  His chest x-ray shows a small left pleural effusion, markedly improved compared to his last chest x-ray. He has moderate cardiomegaly, substantially increased cardiac silhouette size compared with last year, although not much different from chest x-ray performed January 25. His echocardiogram during her last hospitalization showed EF 55-60 percent but left ventriculogram performed on the same day during cardiac catheterization showed an EF of 30-35 percent. Intraoperative TEE after bypass surgery showed improvement in LVEF.  His ECG shows nonspecific ST segment changes, improved from his recent hospitalization. His cardiac troponin I level  is 0.12, decreased from a peak of 7.78 on January 26. at the time of his discharge.  Additional medical problems include insulin requiring diabetes mellitus and bipolar disorder on lithium. He bears a questionable diagnosis of rheumatoid arthritis. 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 He smokes cigars.   PMHx:  Past Medical History  Diagnosis Date  . Drug abuse   . Irregular heart beat   . Bipolar 1 disorder ([place])   . Sleep apnea     DOES NOT USE CPAP  . Diabetes mellitus without complication ([place])     INSULIN DEPENDENT  . GERD (gastroesophageal reflux disease)     Past Surgical History  Procedure Laterality Date  . Leg surgery    . Neck surgery    . Nasal sinus surgery    . Wrist surgery    . Cholecystectomy    . Cardiac catheterization N/A 01/13/2016    Procedure: Left Heart Cath and Coronary Angiography;  Surgeon: Belva Crome, MD;  Location: [facility];  Service: Cardiovascular;  Laterality: N/A;  . Coronary artery bypass graft N/A 01/20/2016    Procedure: CORONARY ARTERY BYPASS GRAFTING (CABG) times four using left internal mammary artery and right saphenous vein;  Surgeon: Ivin Poot, MD;  Location: [facility];  Service: Open Heart Surgery;  Laterality: N/A;  . Tee without cardioversion N/A 01/20/2016    Procedure: TRANSESOPHAGEAL ECHOCARDIOGRAM (TEE);  Surgeon: Ivin Poot, MD;  Location: [facility];  Service: Open Heart Surgery;  Laterality: N/A;    FAMHx:  History reviewed. No pertinent family history.  SOCHx:   reports that he has been smoking Cigars.  He has never used  smokeless tobacco. He reports that he uses illicit drugs (Cocaine). He reports that he does not drink alcohol.  ALLERGIES:  Allergies  Allergen Reactions  . Sulfa Antibiotics Other (See Comments)    Childhood     ROS: Pertinent items noted in HPI and remainder of comprehensive ROS otherwise negative.  HOME MEDS:  (Not in a hospital  admission)  LABS/IMAGING: Results for orders placed or performed during the hospital encounter of 01/29/16 (from the past 48 hour(s))  Basic metabolic panel     Status: Abnormal   Collection Time: 01/29/16 11:03 AM  Result Value Ref Range   Sodium 135 135 - 145 mmol/L   Potassium 4.3 3.5 - 5.1 mmol/L   Chloride 95 (L) 101 - 111 mmol/L   CO2 28 22 - 32 mmol/L   Glucose, Bld 128 (H) 65 - 99 mg/dL   BUN 21 (H) 6 - 20 mg/dL   Creatinine, Ser 1.14 0.61 - 1.24 mg/dL   Calcium 9.3 8.9 - 10.3 mg/dL   GFR calc non Af Amer >60 >60 mL/min   GFR calc Af Amer >60 >60 mL/min    Comment: (NOTE) The eGFR has been calculated using the CKD EPI equation. This calculation has not been validated in all clinical situations. eGFR's persistently <60 mL/min signify possible Chronic Kidney Disease.    Anion gap 12 5 - 15  CBC     Status: Abnormal   Collection Time: 01/29/16 11:03 AM  Result Value Ref Range   WBC 11.0 (H) 4.0 - 10.5 K/uL   RBC 4.50 4.22 - 5.81 MIL/uL   Hemoglobin 12.9 (L) 13.0 - 17.0 g/dL   HCT 39.6 39.0 - 52.0 %   MCV 88.0 78.0 - 100.0 fL   MCH 28.7 26.0 - 34.0 pg   MCHC 32.6 30.0 - 36.0 g/dL   RDW 13.0 11.5 - 15.5 %   Platelets 380 150 - 400 K/uL  Magnesium     Status: None   Collection Time: 01/29/16 11:03 AM  Result Value Ref Range   Magnesium 1.9 1.7 - 2.4 mg/dL  I-stat troponin, ED (not at [facility], [facility])     Status: Abnormal   Collection Time: 01/29/16 11:09 AM  Result Value Ref Range   Troponin i, poc 0.12 (HH) 0.00 - 0.08 ng/mL   Comment NOTIFIED PHYSICIAN    Comment 3            Comment: Due to the release kinetics of cTnI, a negative result within the first hours of the onset of symptoms does not rule out myocardial infarction with certainty. If myocardial infarction is still suspected, repeat the test at appropriate intervals.    Dg Chest 2 View  01/29/2016  CLINICAL DATA:  50 year old male with acute chest pain and shortness of breath for 2 days. Status post CABG  8 days ago. EXAM: CHEST  2 VIEW COMPARISON:  01/23/2016 and prior chest radiographs FINDINGS: Cardiomegaly and CABG changes identified. Left basilar atelectasis and small left pleural effusion are improved. There is no evidence of pneumothorax or pulmonary edema. No acute bony abnormalities are identified. IMPRESSION: CABG changes with decreased left basilar atelectasis and small left effusion. No acute abnormalities or other changes noted. Electronically Signed   By: Margarette Canada M.D.   On: 01/29/2016 10:57    VITALS: Blood pressure 107/68, pulse 64, temperature 98.1 F (36.7 C), temperature source Oral, resp. rate 15, SpO2 99 %. Blood pressure decreased to 94/70 after sitting up EXAM:  General: Alert, oriented  x3, no distress Head: no evidence of trauma, PERRL, EOMI, no exophtalmos or lid lag, no myxedema, no xanthelasma; normal ears, nose and oropharynx Neck: Flat jugular venous pulsations and no hepatojugular reflux; brisk carotid pulses without delay and no carotid bruits Chest: Dullness to percussion and diminished breath sounds in the left base, 1/4Way up,  symmetrical and full respiratory excursions. Sternotomy scar actually looks pretty healthy with minimal swelling at the cranial portion. There is no evidence of redness or other signs of cellulitis. Right now looks dry without any drainage. Cardiovascular: normal position and quality of the apical impulse, regular rhythm, normal first heart sound and normal second heart sounds, no rubs or gallops, no murmur Abdomen: no tenderness or distention, no masses by palpation, no abnormal pulsatility or arterial bruits, normal bowel sounds, no hepatosplenomegaly Extremities: no clubbing, cyanosis or edema; 2+ radial, ulnar and brachial pulses bilaterally; 2+ right femoral, posterior tibial and dorsalis pedis pulses; 2+ left femoral, posterior tibial and dorsalis pedis pulses; no subclavian or femoral bruits Neurological: grossly  nonfocal   ECG: Sinus rhythm with PVCs, minor intraventricular conduction delay (QRS 111 ms), nonspecific ST changes  CXR: Cardiomegaly, no overt signs of vascular congestion/heart failure, small left pleural effusion  IMPRESSION:  1. Chest pain is atypical, but he believes reminiscent of his ischemic syndrome. He describes it as sharp which would be unusual for angina pectoris. Consider postop pericarditis 2. Cardiomegaly on chest x-ray, consider pericardial effusion. No overt clinical signs of temp and on. His low blood pressure may be due to excessive diuresis in view of the substantial drop with change in position. No evidence of jugular venous distention or other signs of hypervolemia. Check echocardiogram effusion and regional wall motion 3. Small left pleural effusion is not unusual this soon after bypass surgery and is decreasing in size compared with previous x-rays 4. Minor increase in cardiac troponin I is also not unusual so soon after non-ST segment elevation myocardial infarction and bypass surgery. Repeat an assay. If the level is similar or dropping, with bleed against a new acute coronary syndrome 5. Consider amiodarone side effects. I don't think there is a compelling reason to continue this drug since he has not actually had atrial fibrillation. 6. Sternal wound drainage. He is still receiving oral antibiotics. At least by physical exam there does not appear to be clear evidence of cellulitis or deep sternal infection. 7. Diaphoresis, maybe sign of infection or even hypoglycemia. He has lost substantial weight since his cardiac problems began. 8. CAD status post recent and STEMI and CABG - ideally he should be on 12 months of dual antiplatelet therapy and plan to start at discharge if we do not identify a large pericardial effusion  PLAN: He is already feeling better. If his repeat cardiac enzymes and echocardiogram did not show evidence of large pericardial effusion or new  acute coronary syndrome, maybe sending him home within the next 24 hours. He appears to be relatively dry and I would stop his furosemide. Also would stop amiodarone unless we see atrial fibrillation during this hospitalization. I think he should monitor his blood glucose more frequently in the near future.  Sanda Klein, MD, [facility] [phone] office [phone] pager  01/29/2016, 1:30 PM

## 2016-01-29 NOTE — ED Provider Notes (Signed)
CSN: [id number]     Arrival date & time 01/29/16  1007 History   First MD Initiated Contact with Patient 01/29/16 1011     Chief Complaint  Patient presents with  . Chest Pain  . Shortness of Breath   (Consider location/radiation/quality/duration/timing/severity/associated sxs/prior Treatment) The history is provided by the patient and the spouse. No language interpreter was used.    Austin Arnold is a 50 y.o male with history of drug abuse, hyperlipidemia, irregular heartbeat, diabetes, bipolar disorder, and recent NSTEMI and CABG x 4 less than 2 weeks ago who presents for intermittent chest pain and shortness of breath since leaving the hospital yesterday. States the pain radiates to his back. He states that he feels that he cannot catch his breath for up to 10 seconds at times. Short of breath at rest. Takes Lasix. Reports having an infection after CABG while in the hospital. Also reports nauseous nerves and dizziness. He took 2 tramadol this morning 2 hours ago for pain. Patient is recovering addict and states that he does not want any narcotic pain medications but did take them after his surgery. Patient has sleep apnea but states that he does not wear the CPAP at night. Says that it does not feel comfortable. He smokes cigars. Father had ACS at a young age. Takes 81 mg of aspirin daily. Denies being on any other anticoagulation medication.   Past Medical History  Diagnosis Date  . Drug abuse   . Irregular heart beat   . Bipolar 1 disorder ([facility])   . Sleep apnea     DOES NOT USE CPAP  . Diabetes mellitus without complication ([facility])     INSULIN DEPENDENT  . GERD (gastroesophageal reflux disease)    Past Surgical History  Procedure Laterality Date  . Leg surgery    . Neck surgery    . Nasal sinus surgery    . Wrist surgery    . Cholecystectomy    . Cardiac catheterization N/A 01/13/2016    Procedure: Left Heart Cath and Coronary Angiography;  Surgeon: Lyn Records, MD;  Location: [facility]  INVASIVE CV [facility];  Service: Cardiovascular;  Laterality: N/A;  . Coronary artery bypass graft N/A 01/20/2016    Procedure: CORONARY ARTERY BYPASS GRAFTING (CABG) times four using left internal mammary artery and right saphenous vein;  Surgeon: Kerin Perna, MD;  [facility]: [facility] OR;  Service: Open Heart Surgery;  Laterality: N/A;  . Tee without cardioversion N/A 01/20/2016    Procedure: TRANSESOPHAGEAL ECHOCARDIOGRAM (TEE);  Surgeon: Kerin Perna, MD;  Location: [facility];  Service: Open Heart Surgery;  Laterality: N/A;   History reviewed. No pertinent family history. Social History  Substance Use Topics  . Smoking status: Current Every Day Smoker    Types: Cigars  . Smokeless tobacco: Never Used  . Alcohol Use: No    Review of Systems  Respiratory: Positive for shortness of breath.   Cardiovascular: Positive for chest pain.  All other systems reviewed and are negative.     Allergies  Sulfa antibiotics  Home Medications   Prior to Admission medications   Medication Sig Start Date End Date Taking? Authorizing Provider  acetaminophen (TYLENOL) 325 MG tablet Take 2 tablets (650 mg total) by mouth every 4 (four) hours as needed for fever or headache. 01/27/16  Yes Donielle Margaretann Loveless, PA-C  amiodarone (PACERONE) 200 MG tablet Take 1 tablet (200 mg total) by mouth 2 (two) times daily. For 5 days then take Amiodarone 200 mg daily  by mouth thereafter 01/27/16  Yes Donielle Margaretann Loveless, PA-C  aspirin EC 325 MG EC tablet Take 1 tablet (325 mg total) by mouth daily. 01/27/16  Yes Donielle Margaretann Loveless, PA-C  atorvastatin (LIPITOR) 80 MG tablet Take 1 tablet (80 mg total) by mouth daily at 6 PM. 01/27/16  Yes Donielle Margaretann Loveless, PA-C  cephALEXin (KEFLEX) 500 MG capsule Take 1 capsule (500 mg total) by mouth 3 (three) times daily. 01/27/16  Yes Donielle Margaretann Loveless, PA-C  furosemide (LASIX) 40 MG tablet Take 1 tablet (40 mg total) by mouth daily. For one week then stop. 01/27/16  Yes Donielle Margaretann Loveless, PA-C   Insulin Glargine (TOUJEO SOLOSTAR) 300 UNIT/ML SOPN Inject 48 Units into the skin daily.   Yes Historical Provider, MD  lithium 600 MG capsule Take 600 mg by mouth daily. 12/02/15  Yes Historical Provider, MD  metFORMIN (GLUCOPHAGE-XR) 500 MG 24 hr tablet Take 1,000 mg by mouth 2 (two) times daily. 12/22/15  Yes Historical Provider, MD  metoprolol tartrate (LOPRESSOR) 25 MG tablet Take 1 tablet (25 mg total) by mouth 2 (two) times daily. 01/27/16  Yes Donielle Margaretann Loveless, PA-C  potassium chloride SA (K-DUR,KLOR-CON) 20 MEQ tablet Take 1 tablet (20 mEq total) by mouth daily. 01/27/16  Yes Donielle Margaretann Loveless, PA-C  prednisoLONE 5 MG TABS tablet Take 5 mg by mouth at bedtime.   Yes Historical Provider, MD  promethazine (PHENERGAN) 12.5 MG tablet Take 1 tablet (12.5 mg total) by mouth every 6 (six) hours as needed for nausea or vomiting. 01/28/16  Yes Loreli Slot, MD  traMADol (ULTRAM) 50 MG tablet Take 1-2 tablets (50-100 mg total) by mouth every 4 (four) hours as needed for moderate pain. 01/27/16  Yes Donielle Margaretann Loveless, PA-C  traZODone (DESYREL) 100 MG tablet Take 100 mg by mouth daily. 12/16/15  Yes Historical Provider, MD   BP 107/68 mmHg  Pulse 64  Temp(Src) 98.1 F (36.7 C) (Oral)  Resp 15  SpO2 99% Physical Exam  Constitutional: He is oriented to person, place, and time. He appears well-developed and well-nourished.  HENT:  Head: Normocephalic and atraumatic.  Eyes: Conjunctivae are normal.  Neck: Normal range of motion. Neck supple.  Cardiovascular: Normal rate, regular rhythm and normal heart sounds.   Regular Rate and rhythm.  Pulmonary/Chest: Effort normal and breath sounds normal.  Large vertical anterior chest wall surgical scar that is healing well without surrounding erythema or drainage.  Crackles at the left base. No wheezing area and no respiratory distress or use of accessory muscles. Patient intermittently gasps for air.  Abdominal: Soft. There is no tenderness.   Musculoskeletal: Normal range of motion.  No lower extremity edema or tenderness.  Neurological: He is alert and oriented to person, place, and time.  Skin: Skin is warm and dry.  Nursing note and vitals reviewed.   ED Course  Procedures (including critical care time) Labs Review Labs Reviewed  BASIC METABOLIC PANEL - Abnormal; Notable for the following:    Chloride 95 (*)    Glucose, Bld 128 (*)    BUN 21 (*)    All other components within normal limits  CBC - Abnormal; Notable for the following:    WBC 11.0 (*)    Hemoglobin 12.9 (*)    All other components within normal limits  I-STAT TROPOININ, ED - Abnormal; Notable for the following:    Troponin i, poc 0.12 (*)    All other components within normal limits  MAGNESIUM  I-STAT  TROPOININ, ED    Imaging Review Dg Chest 2 View  01/29/2016  CLINICAL DATA:  50 year old male with acute chest pain and shortness of breath for 2 days. Status post CABG 8 days ago. EXAM: CHEST  2 VIEW COMPARISON:  01/23/2016 and prior chest radiographs FINDINGS: Cardiomegaly and CABG changes identified. Left basilar atelectasis and small left pleural effusion are improved. There is no evidence of pneumothorax or pulmonary edema. No acute bony abnormalities are identified. IMPRESSION: CABG changes with decreased left basilar atelectasis and small left effusion. No acute abnormalities or other changes noted. Electronically Signed   By: Harmon Pier M.D.   On: 01/29/2016 10:57   I have personally reviewed and evaluated these images and lab results as part of my medical decision-making.   EKG Interpretation   Date/Time:  Saturday January 29 2016 10:14:30 EST Ventricular Rate:  66 PR Interval:  212 QRS Duration: 111 QT Interval:  435 QTC Calculation: 456 R Axis:   9 Text Interpretation:  Sinus rhythm Multiple ventricular premature  complexes Prolonged PR interval Incomplete left bundle branch block  Confirmed by RAY MD, DANIELLE ([id number]) on 01/29/2016  11:55:27 AM      MDM   Final diagnoses:  Chest pain, unspecified chest pain type  Shortness of breath   Patient presents for CP and sob that began yesterday.  She was admitted on 01/12/2016 for an STEMI with CABG 4. He was discharged yesterday. On 81 mg aspirin. On exam, patient has crackles at the left bases. He gasps for air intermittently. No respiratory distress at this time. Well appearing surgical scar without signs of infection. She refused morphine because he is a recovering addict. Nitro was give.  Chest x-ray shows CABG changes with left basilar atelectasis and small left effusion. EKG is not concerning for STEMI. Troponin is .12    I spoke to cardiology who stated that they would see the patient in the ED. Dr. Royann Shivers came to the ED to see the patient and wrote a note. He recommended observing the patient for 24 hours. He will admit the patient to telemetry. Repeat troponin is 0.08. I discussed findings with the patient as well as admission. Patient agrees with plan.  Filed Vitals:   01/29/16 1115 01/29/16 1157  BP: 111/76 107/68  Pulse: 63 64  Temp:    Resp: 15 15   Medications  nitroGLYCERIN (NITROGLYN) 2 % ointment 1 inch (1 inch Topical Given 01/29/16 1158)          Catha Gosselin, PA-C 01/29/16 1512  Margarita Grizzle, MD 01/30/16 [id number]

## 2016-01-29 NOTE — ED Notes (Signed)
Pt from home with c/o worsening SOB and CP starting yesterday after vomiting. Pt was d/c from the hospital 2 days ago s/p open heart surgery on 01/20/16.  Pt additionally reports increased drainage from surgical site yesterday while having a BM, drainage amount has since slowed.  Pt in NAD, A&O.

## 2016-01-29 NOTE — ED Notes (Signed)
Pt denies pain at this time.  No longer diaphoretic.  SOB somewhat improved.

## 2016-01-30 ENCOUNTER — Other Ambulatory Visit: Payer: Self-pay

## 2016-01-30 ENCOUNTER — Observation Stay (HOSPITAL_BASED_OUTPATIENT_CLINIC_OR_DEPARTMENT_OTHER): Payer: Medicare Other

## 2016-01-30 DIAGNOSIS — R079 Chest pain, unspecified: Secondary | ICD-10-CM | POA: Diagnosis not present

## 2016-01-30 DIAGNOSIS — R072 Precordial pain: Secondary | ICD-10-CM | POA: Diagnosis not present

## 2016-01-30 DIAGNOSIS — E118 Type 2 diabetes mellitus with unspecified complications: Secondary | ICD-10-CM | POA: Diagnosis not present

## 2016-01-30 DIAGNOSIS — G4730 Sleep apnea, unspecified: Secondary | ICD-10-CM | POA: Diagnosis not present

## 2016-01-30 DIAGNOSIS — F319 Bipolar disorder, unspecified: Secondary | ICD-10-CM | POA: Diagnosis not present

## 2016-01-30 DIAGNOSIS — I25118 Atherosclerotic heart disease of native coronary artery with other forms of angina pectoris: Secondary | ICD-10-CM | POA: Diagnosis not present

## 2016-01-30 DIAGNOSIS — J948 Other specified pleural conditions: Secondary | ICD-10-CM | POA: Diagnosis not present

## 2016-01-30 DIAGNOSIS — E785 Hyperlipidemia, unspecified: Secondary | ICD-10-CM | POA: Diagnosis not present

## 2016-01-30 LAB — CBC
HCT: 35.7 % — ABNORMAL LOW (ref 39.0–52.0)
Hemoglobin: 11.7 g/dL — ABNORMAL LOW (ref 13.0–17.0)
MCH: 28.6 pg (ref 26.0–34.0)
MCHC: 32.8 g/dL (ref 30.0–36.0)
MCV: 87.3 fL (ref 78.0–100.0)
PLATELETS: 346 10*3/uL (ref 150–400)
RBC: 4.09 MIL/uL — AB (ref 4.22–5.81)
RDW: 12.9 % (ref 11.5–15.5)
WBC: 10.1 10*3/uL (ref 4.0–10.5)

## 2016-01-30 LAB — TROPONIN I
TROPONIN I: 0.21 ng/mL — AB (ref <0.031)
TROPONIN I: 0.19 ng/mL — AB (ref <0.031)

## 2016-01-30 MED ORDER — NITROGLYCERIN 0.4 MG SL SUBL: 0.4000 mg | SUBLINGUAL_TABLET | SUBLINGUAL | Status: DC | PRN

## 2016-01-30 NOTE — Progress Notes (Signed)
Called ECHO technician on call as patient is anticipating discharge today. Pt resting with call bell within reach.  Will continue to monitor. Thomas Hoff, RN

## 2016-01-30 NOTE — Progress Notes (Signed)
Patient Name: Austin Arnold Date of Encounter: 01/30/2016   SUBJECTIVE  Feeling well. No chest pain, sob or palpitations. He is very anxious to go home.   CURRENT MEDS . aspirin EC  81 mg Oral Daily  . atorvastatin  80 mg Oral q1800  . cephALEXin  500 mg Oral TID  . heparin  5,000 Units Subcutaneous 3 times per day  . insulin glargine  48 Units Subcutaneous Daily  . lithium  600 mg Oral Daily  . metFORMIN  1,000 mg Oral BID WC  . metoprolol tartrate  25 mg Oral BID  . prednisoLONE  5 mg Oral QHS  . traZODone  100 mg Oral QHS    OBJECTIVE  Filed Vitals:   01/29/16 1658 01/29/16 1700 01/29/16 2000 01/30/16 0500  BP: 130/63  109/67 106/59  Pulse: 70  78 62  Temp: 98.1 F (36.7 C)  98.1 F (36.7 C) 98.1 F (36.7 C)  TempSrc: Oral  Oral Oral  Resp: 19  18 18   Height:  6\' 2"  (1.88 m)    Weight:  243 lb 9.7 oz (110.5 kg)  241 lb 14.4 oz (109.725 kg)  SpO2: 98%  96% 99%    Intake/Output Summary (Last 24 hours) at 01/30/16 1151 Last data filed at 01/30/16 0810  Gross per 24 hour  Intake    240 ml  Output      0 ml  Net    240 ml   Filed Weights   01/29/16 1700 01/30/16 0500  Weight: 243 lb 9.7 oz (110.5 kg) 241 lb 14.4 oz (109.725 kg)    PHYSICAL EXAM  General: Pleasant, NAD. Very anxious Neuro: Alert and oriented X 3. Moves all extremities spontaneously. Psych: Normal affect. HEENT:  Normal  Neck: Supple without bruits or JVD. Lungs:  Resp regular and unlabored. Diminished breath sound at left base. Sternotomy scar without erythema.  Heart: RRR no s3, s4, or murmurs. Abdomen: Soft, non-tender, non-distended, BS + x 4.  Extremities: No clubbing, cyanosis or edema. DP/PT/Radials 2+ and equal bilaterally.  Accessory Clinical Findings  CBC  Recent Labs  01/29/16 1711 01/30/16 0445  WBC 10.6* 10.1  HGB 12.4* 11.7*  HCT 38.0* 35.7*  MCV 88.6 87.3  PLT 359 346   Basic Metabolic Panel  Recent Labs  01/29/16 1103 01/29/16 1711  NA 135  --   K 4.3   --   CL 95*  --   CO2 28  --   GLUCOSE 128*  --   BUN 21*  --   CREATININE 1.14 1.25*  CALCIUM 9.3  --   MG 1.9  --    Liver Function Tests No results for input(s): AST, ALT, ALKPHOS, BILITOT, PROT, ALBUMIN in the last 72 hours. No results for input(s): LIPASE, AMYLASE in the last 72 hours. Cardiac Enzymes  Recent Labs  01/29/16 1711 01/29/16 2306 01/30/16 0445  TROPONINI 0.21* 0.21* 0.19*    TELE  Sinus rhythm  Radiology/Studies  Dg Chest 2 View  01/29/2016  CLINICAL DATA:  50 year old male with acute chest pain and shortness of breath for 2 days. Status post CABG 8 days ago. EXAM: CHEST  2 VIEW COMPARISON:  01/23/2016 and prior chest radiographs FINDINGS: Cardiomegaly and CABG changes identified. Left basilar atelectasis and small left pleural effusion are improved. There is no evidence of pneumothorax or pulmonary edema. No acute bony abnormalities are identified. IMPRESSION: CABG changes with decreased left basilar atelectasis and small left effusion. No acute abnormalities or other changes  noted. Electronically Signed   By: Harmon Pier M.D.   On: 01/29/2016 10:57   Dg Chest 2 View  01/23/2016  CLINICAL DATA:  Post op CABG x 4 days ago EXAM: CHEST  2 VIEW COMPARISON:  01/22/2016 FINDINGS: Interval retraction of RIGHT IJ sheath. Stable cardiac silhouette. There is LEFT basilar atelectasis and small effusion unchanged. RIGHT lung is clear. No pneumothorax. IMPRESSION: Persistent LEFT basilar atelectasis small effusion Electronically Signed   By: Genevive Bi M.D.   On: 01/23/2016 07:56   Dg Chest 2 View  01/20/2016  CLINICAL DATA:  Preop for CABG EXAM: CHEST  2 VIEW COMPARISON:  01/12/2016 FINDINGS: Normal heart size and mediastinal contours. No acute infiltrate or edema. No effusion or pneumothorax. No acute osseous findings. IMPRESSION: No active cardiopulmonary disease. Electronically Signed   By: Marnee Spring M.D.   On: 01/20/2016 04:04   Dg Chest Port 1  View  01/22/2016  CLINICAL DATA:  Status post CABG. EXAM: PORTABLE CHEST 1 VIEW COMPARISON:  01/21/2016 FINDINGS: Sequelae of recent CABG are again identified. Swan-Ganz catheter has been removed. Right jugular sheath remains. Mediastinal drain and left chest tube have been removed. Cardiac silhouette remains enlarged. Focal opacity in the lateral left mid to lower lung is noted at the site of previous chest tube. There is increased opacity in the basilar left lung likely reflecting atelectasis and a small left pleural effusion. The right lung remains clear. No pneumothorax is identified. IMPRESSION: 1. Interval removal of support devices as above. 2. Left basilar atelectasis and small left pleural effusion. Electronically Signed   By: Sebastian Ache M.D.   On: 01/22/2016 07:30   Dg Chest Port 1 View  01/21/2016  CLINICAL DATA:  Status post coronary bypass grafting EXAM: PORTABLE CHEST 1 VIEW COMPARISON:  01/20/2016 FINDINGS: Endotracheal tube and nasogastric catheter have been removed in the interval. A Swan-Ganz catheter, mediastinal drain and left thoracostomy catheter remain in place. No pneumothorax is noted. The lungs are clear bilaterally. IMPRESSION: Tubes and lines as described above.  No acute abnormality noted. Electronically Signed   By: Alcide Clever M.D.   On: 01/21/2016 07:38   Dg Chest Port 1 View  01/20/2016  CLINICAL DATA:  Status post CABG x4 EXAM: PORTABLE CHEST 1 VIEW COMPARISON:  January 20, 2016 FINDINGS: Endotracheal tube is identified distal tip 4.3 cm from carina. Nasogastric tube is noted with distal tip not included on film but is at least in the stomach. Mediastinal drain and left chest tube are identified in good position. Swan-Ganz catheter is identified in good position. The patient is status post CABG. There is no focal infiltrate, pulmonary edema, or pleural effusion. There is no pneumothorax. The visualized skeletal structures are unremarkable. IMPRESSION: Postsurgical changes as  described. Life supporting devices in good position. Electronically Signed   By: Sherian Rein M.D.   On: 01/20/2016 14:55   Dg Chest Port 1 View  01/12/2016  CLINICAL DATA:  Chest pain radiating into left arm for several weeks. Shortness of breath. EXAM: PORTABLE CHEST 1 VIEW COMPARISON:  May 18, 2015 FINDINGS: Lungs are clear. Heart size and pulmonary vascularity are normal. No adenopathy. No bone lesions. No pneumothorax. IMPRESSION: No edema or consolidation. Electronically Signed   By: Bretta Bang III M.D.   On: 01/12/2016 20:48    ASSESSMENT AND PLAN Principal Problem:   Chest pain Active Problems:   NSTEMI (non-ST elevated myocardial infarction) (HCC)   DM (diabetes mellitus), type 2, uncontrolled with complications (HCC)  CAD 3V at cath- needs CABG   Cardiomyopathy, ischemic-EF 30-35% at cath   S/P CABG x 4   Cardiomegaly   Dyspnea   Pleural effusion, left    Plan: Troponin 0.21->0.21->0.19. His troponin was 4.75 during last admission 01/13/16. ? Trending down. No further chest pain. The patient has pending echocardiogram for evaluation of large pericardial effusion. Discontinued lasix and amio. No arrhythmia noted on tele.   Hx of bipolar and on Lithium. Seems very anxious today and wants to go home now. Updated MD.   Lorelei Pont PA-C Pager [phone]  I have seen and examined the patient along with Bhagat,Bhavinkumar PA-C.  I have reviewed the chart, notes and new data.  I agree with PA's note.  Key new complaints: no further pain Key examination changes: no signs of CHF, frequent PVCs, no sustained arrhythmia Key new findings / data: bedside review of echo shows no effusion. LVEF around 45%, some apical hypokinesis  PLAN: DC home. Stop furosemide and amiodarone. Cardiac rehab and early f/u  Thurmon Fair, MD, [facility] [phone] 01/30/2016, 12:47 PM

## 2016-01-30 NOTE — Progress Notes (Signed)
  Echocardiogram 2D Echocardiogram has been performed.  [name] 01/30/2016, 12:50 PM

## 2016-01-30 NOTE — Discharge Summary (Signed)
Discharge Summary    Patient ID: Austin Arnold,  MRN: [id number], DOB/AGE: 1965-12-22 50 y.o.  Admit date: 01/29/2016 Discharge date: 01/30/2016  Primary Care Provider: [facility] Primary Cardiologist: Dr. Jens Som  Discharge Diagnoses    Principal Problem:   Chest pain Active Problems:   NSTEMI (non-ST elevated myocardial infarction) (HCC)   DM (diabetes mellitus), type 2, uncontrolled with complications (HCC)   CAD 3V at cath- needs CABG   Cardiomyopathy, ischemic-EF 30-35% at cath   S/P CABG x 4   Cardiomegaly   Dyspnea   Pleural effusion, left   Allergies Allergies  Allergen Reactions  . Sulfa Antibiotics Other (See Comments)    Childhood     History of Present Illness     This is a 50 y.o. male with a past medical history significant for coronary artery disease status post recent acute myocardial infarction (January 25, NSTEMI) and four-vessel bypass surgery on February 2nd, discharged February 7th, returns with complaints of difficulty catching his breath, diaphoresis, stabbing chest pain which he describes a similar to his pre-bypass angina, copious clear fluid drainage from his sternotomy wound. The shortness of breath is rather unusual. He states that he has periods of 5-10 seconds at a time when he cannot catch his breath. His wife reports witnessing apnea when he is awake. He does very diagnosis of sleep apnea and has not been using CPAP. He denies fever, shaking chills, frank purulent discharge or bleeding from his sternal wound, pain on deep inspiration or cough, leg edema, palpitations or syncope. He is taking tramadol for incisional site pain. 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 He is taking antibiotics since he had low-grade fever and serosanguineous drainage from his proximal sternal incision felt to be secondary to fat necrosis. He is still taking amiodarone. Don't think he actually had atrial fibrillation but had very  frequent PACs  His chest x-ray shows a small left pleural effusion, markedly improved compared to his last chest x-ray. He has moderate cardiomegaly, substantially increased cardiac silhouette size compared with last year, although not much different from chest x-ray performed January 25. His echocardiogram during her last hospitalization showed EF 55-60 percent but left ventriculogram performed on the same day during cardiac catheterization showed an EF of 30-35 percent. Intraoperative TEE after bypass surgery showed improvement in LVEF.  His ECG shows nonspecific ST segment changes, improved from his recent hospitalization. His cardiac troponin I level is 0.12, decreased from a peak of 7.78 on January 26. at the time of his discharge.  Additional medical problems include insulin requiring diabetes mellitus and bipolar disorder on lithium. He bears a questionable diagnosis of rheumatoid arthritis. 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 He smokes cigars.    Hospital Course     Consultants: Wound care consult  History pain was felt to be atypical,. It was felt the patient appears to be dry, his Lasix was stopped. His amiodarone was also stopped given the lack of recurrence of his atrial fibrillation. Wound care was consulted to help manage his sternal wound. Overnight, his serial troponin has been mildly elevated around 0.21. The troponin was likely trending down from last admission and from his CABG recently. Echocardiogram obtained on 01/30/2016 showed EF 45-50%, mild hypokinesis of apical anteroseptal, inferior, inferoseptal myocardium, grade 2 diastolic dysfunction, paradoxical septal wall motion, trivial pericardial effusion was identified.  He was seen on 2/12, at which time he denies any any  further chest discomfort. He is deemed stable for discharge from cardiology perspective. His Lasix and amiodarone will be stopped. I have stopped his KCl. He has previously  arranged early follow-up with cardiothoracic surgery and cardiology service. He will also require outpatient cardiac rehabilitation _____________  Discharge Vitals Blood pressure 108/61, pulse 66, temperature 98.1 F (36.7 C), temperature source Oral, resp. rate 20, height  (1.88 m), weight 241 lb 14.4 oz (109.725 kg), SpO2 97 %.  Filed Weights   01/29/16 1700 01/30/16 0500  Weight: 243 lb 9.7 oz (110.5 kg) 241 lb 14.4 oz (109.725 kg)    Labs & Radiologic Studies     CBC  Recent Labs  01/29/16 1711 01/30/16 0445  WBC 10.6* 10.1  HGB 12.4* 11.7*  HCT 38.0* 35.7*  MCV 88.6 87.3  PLT 359 346   Basic Metabolic Panel  Recent Labs  01/29/16 1103 01/29/16 1711  NA 135  --   K 4.3  --   CL 95*  --   CO2 28  --   GLUCOSE 128*  --   BUN 21*  --   CREATININE 1.14 1.25*  CALCIUM 9.3  --   MG 1.9  --    Cardiac Enzymes  Recent Labs  01/29/16 1711 01/29/16 2306 01/30/16 0445  TROPONINI 0.21* 0.21* 0.19*    Dg Chest 2 View  01/29/2016  CLINICAL DATA:  50 year old male with acute chest pain and shortness of breath for 2 days. Status post CABG 8 days ago. EXAM: CHEST  2 VIEW COMPARISON:  01/23/2016 and prior chest radiographs FINDINGS: Cardiomegaly and CABG changes identified. Left basilar atelectasis and small left pleural effusion are improved. There is no evidence of pneumothorax or pulmonary edema. No acute bony abnormalities are identified. IMPRESSION: CABG changes with decreased left basilar atelectasis and small left effusion. No acute abnormalities or other changes noted. Electronically Signed   By: Harmon Pier M.D.   On: 01/29/2016 10:57   Dg Chest 2 View  01/23/2016  CLINICAL DATA:  Post op CABG x 4 days ago EXAM: CHEST  2 VIEW COMPARISON:  01/22/2016 FINDINGS: Interval retraction of RIGHT IJ sheath. Stable cardiac silhouette. There is LEFT basilar atelectasis and small effusion unchanged. RIGHT lung is clear. No pneumothorax. IMPRESSION: Persistent LEFT basilar  atelectasis small effusion Electronically Signed   By: Genevive Bi M.D.   On: 01/23/2016 07:56   Dg Chest 2 View  01/20/2016  CLINICAL DATA:  Preop for CABG EXAM: CHEST  2 VIEW COMPARISON:  01/12/2016 FINDINGS: Normal heart size and mediastinal contours. No acute infiltrate or edema. No effusion or pneumothorax. No acute osseous findings. IMPRESSION: No active cardiopulmonary disease. Electronically Signed   By: Marnee Spring M.D.   On: 01/20/2016 04:04   Dg Chest Port 1 View  01/22/2016  CLINICAL DATA:  Status post CABG. EXAM: PORTABLE CHEST 1 VIEW COMPARISON:  01/21/2016 FINDINGS: Sequelae of recent CABG are again identified. Swan-Ganz catheter has been removed. Right jugular sheath remains. Mediastinal drain and left chest tube have been removed. Cardiac silhouette remains enlarged. Focal opacity in the lateral left mid to lower lung is noted at the site of previous chest tube. There is increased opacity in the basilar left lung likely reflecting atelectasis and a small left pleural effusion. The right lung remains clear. No pneumothorax is identified. IMPRESSION: 1. Interval removal of support devices as above. 2. Left basilar atelectasis and small left pleural effusion. Electronically Signed   By: Sebastian Ache M.D.   On:  01/22/2016 07:30   Dg Chest Port 1 View  01/21/2016  CLINICAL DATA:  Status post coronary bypass grafting EXAM: PORTABLE CHEST 1 VIEW COMPARISON:  01/20/2016 FINDINGS: Endotracheal tube and nasogastric catheter have been removed in the interval. A Swan-Ganz catheter, mediastinal drain and left thoracostomy catheter remain in place. No pneumothorax is noted. The lungs are clear bilaterally. IMPRESSION: Tubes and lines as described above.  No acute abnormality noted. Electronically Signed   By: Alcide Clever M.D.   On: 01/21/2016 07:38   Dg Chest Port 1 View  01/20/2016  CLINICAL DATA:  Status post CABG x4 EXAM: PORTABLE CHEST 1 VIEW COMPARISON:  January 20, 2016 FINDINGS:  Endotracheal tube is identified distal tip 4.3 cm from carina. Nasogastric tube is noted with distal tip not included on film but is at least in the stomach. Mediastinal drain and left chest tube are identified in good position. Swan-Ganz catheter is identified in good position. The patient is status post CABG. There is no focal infiltrate, pulmonary edema, or pleural effusion. There is no pneumothorax. The visualized skeletal structures are unremarkable. IMPRESSION: Postsurgical changes as described. Life supporting devices in good position. Electronically Signed   By: Sherian Rein M.D.   On: 01/20/2016 14:55   Dg Chest Port 1 View  01/12/2016  CLINICAL DATA:  Chest pain radiating into left arm for several weeks. Shortness of breath. EXAM: PORTABLE CHEST 1 VIEW COMPARISON:  May 18, 2015 FINDINGS: Lungs are clear. Heart size and pulmonary vascularity are normal. No adenopathy. No bone lesions. No pneumothorax. IMPRESSION: No edema or consolidation. Electronically Signed   By: Bretta Bang III M.D.   On: 01/12/2016 20:48    Disposition   Pt is being discharged home today in good condition.  Follow-up Plans & Appointments    Follow-up Information    Follow up with HAGER, BRYAN, PA-C On 02/07/2016.   Specialties:  Physician Assistant, Radiology, Interventional Cardiology   Why:  @2 :00PM. Cardiology followup   Contact information:   [address] [place] [phone]       Follow up with [phone] III, MD On 02/09/2016.   Specialty:  Cardiothoracic Surgery   Why:  1:30PM. Cardiothoracic surgery followup   Contact information:   [address] [place] [phone]      Discharge Instructions    Diet - low sodium heart healthy    Complete by:  As directed      Increase activity slowly    Complete by:  As directed            Discharge Medications   Current Discharge Medication List    START taking these medications     Details  nitroGLYCERIN (NITROSTAT) 0.4 MG SL tablet Place 1 tablet (0.4 mg total) under the tongue every 5 (five) minutes x 3 doses as needed for chest pain. Qty: 25 tablet, Refills: 3      CONTINUE these medications which have NOT CHANGED   Details  acetaminophen (TYLENOL) 325 MG tablet Take 2 tablets (650 mg total) by mouth every 4 (four) hours as needed for fever or headache.    aspirin EC 325 MG EC tablet Take 1 tablet (325 mg total) by mouth daily. Qty: 30 tablet, Refills: 0    atorvastatin (LIPITOR) 80 MG tablet Take 1 tablet (80 mg total) by mouth daily at 6 PM. Qty: 30 tablet, Refills: 1    cephALEXin (KEFLEX) 500 MG capsule Take 1  capsule (500 mg total) by mouth 3 (three) times daily. Qty: 21 capsule, Refills: 0    Insulin Glargine (TOUJEO SOLOSTAR) 300 UNIT/ML SOPN Inject 48 Units into the skin daily.    lithium 600 MG capsule Take 600 mg by mouth daily.    metFORMIN (GLUCOPHAGE-XR) 500 MG 24 hr tablet Take 1,000 mg by mouth 2 (two) times daily.    metoprolol tartrate (LOPRESSOR) 25 MG tablet Take 1 tablet (25 mg total) by mouth 2 (two) times daily. Qty: 60 tablet, Refills: 1    prednisoLONE 5 MG TABS tablet Take 5 mg by mouth at bedtime.    promethazine (PHENERGAN) 12.5 MG tablet Take 1 tablet (12.5 mg total) by mouth every 6 (six) hours as needed for nausea or vomiting. Qty: 15 tablet, Refills: 0   Associated Diagnoses: Nausea    traMADol (ULTRAM) 50 MG tablet Take 1-2 tablets (50-100 mg total) by mouth every 4 (four) hours as needed for moderate pain. Qty: 40 tablet, Refills: 0    traZODone (DESYREL) 100 MG tablet Take 100 mg by mouth daily.      STOP taking these medications     amiodarone (PACERONE) 200 MG tablet      furosemide (LASIX) 40 MG tablet      potassium chloride SA (K-DUR,KLOR-CON) 20 MEQ tablet           Duration of Discharge Encounter   Greater than 30 minutes including physician time.  Signed, Azalee Course PA-C 01/30/2016, 2:48  PM

## 2016-01-30 NOTE — Consult Note (Signed)
WOC wound consult note Reason for Consult:  Left great toe wound Patient with DM, history of ulcer on the left great toe since Nov. 2016.  He states he has seen podiatrist in the past but does not have the copay need to see the MD now, and the MD will not offer a payment plan to be seen.  Wound type: Neuropathic ulcer Pressure Ulcer POA: No Measurement:open area centrally 0.5cm x 0.5cm x 0.5cm with hyperkeratosis that extends circumferentially  Wound bed: pale, non granular, moist Drainage (amount, consistency, odor) yellow, no odor Periwound: hyperkertotic skin  Dressing procedure/placement/frequency: Will add silver hydrofiber, nurse to teach patient to use every other day at home.  Concerned for risk of osteomyelitis due to location and length the wound has been present. Would need debridement of the hyperkertotic skin in order for this wound to heal as well as appropriate offloading.  I have offered patient offloading shoe while inpatient, he declined.  I have suggested he make arrangements to see a podiatrist or wound care center ASAP due to his risk for worsening of the wound, he states he will make an effort to do so. Will send dressing home with patient to have him change dressing every other day.  Discussed POC with patient and bedside nurse.  Re consult if needed, will not follow at this time. Thanks  Myrl Lazarus [employer], CWOCN [phone])

## 2016-01-31 ENCOUNTER — Other Ambulatory Visit: Payer: Self-pay | Admitting: *Deleted

## 2016-01-31 NOTE — Patient Outreach (Signed)
Pt readmitted on Saturday for chest pain. He was evaluated and sent home on Sunday.  He reports he is feeling much better. He tells me he knows how to care for his surgical wound. He did not have home health ordered.   I requested a home visit on Thursday, February 16th at 10:00 am and he agreed.  Almetta Lovely [facility] Care Manager [phone]

## 2016-02-02 LAB — WOUND CULTURE
Gram Stain: NONE SEEN
SPECIAL REQUESTS: NORMAL

## 2016-02-03 ENCOUNTER — Other Ambulatory Visit: Payer: Self-pay | Admitting: *Deleted

## 2016-02-03 ENCOUNTER — Encounter: Payer: Self-pay | Admitting: *Deleted

## 2016-02-03 ENCOUNTER — Ambulatory Visit: Payer: Self-pay | Admitting: *Deleted

## 2016-02-04 ENCOUNTER — Other Ambulatory Visit: Payer: Self-pay | Admitting: *Deleted

## 2016-02-04 ENCOUNTER — Encounter: Payer: Self-pay | Admitting: *Deleted

## 2016-02-04 ENCOUNTER — Other Ambulatory Visit: Payer: Self-pay | Admitting: Cardiothoracic Surgery

## 2016-02-04 DIAGNOSIS — Z951 Presence of aortocoronary bypass graft: Secondary | ICD-10-CM

## 2016-02-04 NOTE — Patient Outreach (Signed)
Sent pt 3 Emm[facility] programs: Diabetes/MI/Stroke, Foot care, Carbohydrate Counting.  Almetta Lovely [facility] Care Manager [phone]

## 2016-02-04 NOTE — Patient Outreach (Addendum)
S:  Pt is status post CABG - 4 Vessel. He was discharged on 01/27/16 and readmitted on 01/29/16 for chest pain and went home on 01/30/16. He had also been concerned about the amount of drainage he was having from his chest incision.  Pt home now with his wife and doing well. He plans on going to cardiac rehab when he is able. Pt has all his meds but is not taking his Trujeo. He says he feels like he can control his glucose levels with his PO meds and diet. Also he says the trujeo is too expensive. He is not checking his glucose regularly. Last Hgb A1C was 8.0. Pt has an open wound on his L great toe he is changing the bandage every other day. Pt also reports some dizziness upon changing positions.  Current Outpatient Prescriptions on File Prior to Visit  Medication Sig Dispense Refill  . acetaminophen (TYLENOL) 325 MG tablet Take 2 tablets (650 mg total) by mouth every 4 (four) hours as needed for fever or headache.    Marland Kitchen aspirin EC 325 MG EC tablet Take 1 tablet (325 mg total) by mouth daily. 30 tablet 0  . atorvastatin (LIPITOR) 80 MG tablet Take 1 tablet (80 mg total) by mouth daily at 6 PM. 30 tablet 1  . lithium 600 MG capsule Take 600 mg by mouth daily.    . metFORMIN (GLUCOPHAGE-XR) 500 MG 24 hr tablet Take 1,000 mg by mouth 2 (two) times daily.    . metoprolol tartrate (LOPRESSOR) 25 MG tablet Take 1 tablet (25 mg total) by mouth 2 (two) times daily. 60 tablet 1  . nitroGLYCERIN (NITROSTAT) 0.4 MG SL tablet Place 1 tablet (0.4 mg total) under the tongue every 5 (five) minutes x 3 doses as needed for chest pain. 25 tablet 3  . traMADol (ULTRAM) 50 MG tablet Take 1-2 tablets (50-100 mg total) by mouth every 4 (four) hours as needed for moderate pain. 40 tablet 0  . traZODone (DESYREL) 100 MG tablet Take 100 mg by mouth daily.    . Insulin Glargine (TOUJEO SOLOSTAR) 300 UNIT/ML SOPN Inject 48 Units into the skin daily.    . promethazine (PHENERGAN) 12.5 MG tablet Take 1 tablet (12.5 mg total) by mouth  every 6 (six) hours as needed for nausea or vomiting. (Patient not taking: Reported on 02/03/2016) 15 tablet 0   No current facility-administered medications on file prior to visit.    O:  BP 100/60 mmHg  Pulse 55  Resp 18  Ht 1.88 m (6\' 2" )  Wt 244 lb (110.678 kg)  BMI 31.31 kg/m2  SpO2 98%        NFBS - 169       SKIN : Midline chest incision well approximated, minimal drainage on dsg at upper aspect of incision line.                   L great toe with diabetic foot wound: 1.5 cm X 1 cm X .5 cm. No drainage. Needs debridement.       RRR       Lungs clear       No edema  A:  Status post CABG      Diabetes not at goal with foot wound  P:  Discussed areas of concern especially his diabetes management. We will focus on this for pt maximum benefit.      I will call him weekly for the next 3 weeks and see him again  in 2 weeks or more often if necessary.  Fall Risk  02/03/2016 02/03/2016  Falls in the past year? - No  Risk for fall due to : Medication side effect Medication side effect   Depression screen PHQ 2/9 02/03/2016  Decreased Interest 1  Down, Depressed, Hopeless 0  PHQ - 2 Score 1    [facility] CM Care Plan Problem One        Most Recent Value   Care Plan Problem One  No living will or HCPOA   Role Documenting the Problem One  Care Management Coordinator   Care Plan for Problem One  Active   [facility]N CM Short Term Goal #1 (0-30 days)  Pt will complete his Advanced Directives this month.   [facility] CM Short Term Goal #1 Start Date  02/03/16   Interventions for Short Term Goal #1  Provided documents. Encouraged pt to complete, very important, to have on file.    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   THN Long Term Goal Start Date  02/03/16   [facility]N CM Short Term Goal #1 (0-30 days)  Pt will not readmit in the next 30 days.   [facility]N CM Short Term Goal #1 Start Date  02/03/16   Interventions for Short Term Goal #2   Same as for long term goal.    [facility] CM Care Plan Problem Three        Most Recent Value   Care Plan Problem Three  Diabetes   Role Documenting the Problem Three  Care Management Coordinator   Care Plan for Problem Three  Active   THN Long Term Goal (31-90) days  Pt Hgb A1C will be under 7 by the end of 3 months.   THN Long Term Goal Start Date  02/03/16   Interventions for Problem Three Long Term Goal  This will be our main focus all health problems have arisen from uncontrolled diabetes. Will provided diabetes education folder. Gave a few sheets on low carb snacks today, will assign Emmi programs.   [facility] CM Short Term Goal #1 (0-30 days)  Pt to check glucose daily.   [facility]N CM Short Term Goal #1 Start Date  02/03/16   Interventions for Short Term Goal #1  Reinforced the need for pt to be aware of what his levels are and where they should be for good control.(70-120 fasting, <160 nonfasting)   [facility]N CM Short Term Goal #2 (0-30 days)  Pt to see podiatrist ASAP for his diabetic L toe wound.   [facility]N CM Short Term Goal #2 Start Date  02/03/16   Interventions for Short Term Goal #2  Assessed wound, cleansed wound, redressed wound - told pt it needs to be debrided ASAP, encouraged diet vigilence to maintain desired glucose levels.       Almetta Lovely [facility] Care Manager [phone]

## 2016-02-07 ENCOUNTER — Encounter: Payer: Self-pay | Admitting: Physician Assistant

## 2016-02-07 ENCOUNTER — Ambulatory Visit (INDEPENDENT_AMBULATORY_CARE_PROVIDER_SITE_OTHER): Payer: Medicare Other | Admitting: Physician Assistant

## 2016-02-07 VITALS — BP 110/66 | HR 73 | Ht 74.0 in | Wt 250.0 lb

## 2016-02-07 DIAGNOSIS — I2510 Atherosclerotic heart disease of native coronary artery without angina pectoris: Secondary | ICD-10-CM

## 2016-02-07 DIAGNOSIS — G4730 Sleep apnea, unspecified: Secondary | ICD-10-CM

## 2016-02-07 DIAGNOSIS — Z951 Presence of aortocoronary bypass graft: Secondary | ICD-10-CM | POA: Diagnosis not present

## 2016-02-07 DIAGNOSIS — E1165 Type 2 diabetes mellitus with hyperglycemia: Secondary | ICD-10-CM | POA: Diagnosis not present

## 2016-02-07 DIAGNOSIS — M2550 Pain in unspecified joint: Secondary | ICD-10-CM | POA: Diagnosis not present

## 2016-02-07 DIAGNOSIS — K219 Gastro-esophageal reflux disease without esophagitis: Secondary | ICD-10-CM | POA: Diagnosis not present

## 2016-02-07 DIAGNOSIS — R0600 Dyspnea, unspecified: Secondary | ICD-10-CM

## 2016-02-07 DIAGNOSIS — I255 Ischemic cardiomyopathy: Secondary | ICD-10-CM | POA: Diagnosis not present

## 2016-02-07 DIAGNOSIS — E785 Hyperlipidemia, unspecified: Secondary | ICD-10-CM | POA: Diagnosis not present

## 2016-02-07 DIAGNOSIS — I214 Non-ST elevation (NSTEMI) myocardial infarction: Secondary | ICD-10-CM | POA: Diagnosis not present

## 2016-02-07 NOTE — Progress Notes (Signed)
Patient ID: Austin Arnold, male   DOB: [birth year], 50 y.o.   MRN: [id number]    Date:  02/07/2016   ID:  Austin Arnold, DOB 06-04-1966, MRN [id number]  PCP:  [name], MD  Primary Cardiologist:   Croitoru   chief complaint: posthospital follow-up   History of Present Illness: Austin Arnold is a 50 y.o. male  This is a 50 y.o. male with a past medical history significant for coronary artery disease status post recent acute myocardial infarction (January 25, NSTEMI) and four-vessel bypass surgery on February 2nd, discharged February 7th, returns with complaints of difficulty catching his breath, diaphoresis, stabbing chest pain which he describes a similar to his pre-bypass angina, copious clear fluid drainage from his sternotomy wound. The shortness of breath is rather unusual. He states that he has periods of 5-10 seconds at a time when he cannot catch his breath. His wife reports witnessing apnea when he is awake. He does very diagnosis of sleep apnea and has not been using CPAP. He denies fever, shaking chills, frank purulent discharge or bleeding from his sternal wound, pain on deep inspiration or cough, leg edema, palpitations or syncope. He is taking tramadol for incisional site pain. 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 He is taking antibiotics since he had low-grade fever and serosanguineous drainage from his proximal sternal incision felt to be secondary to fat necrosis. He is still taking amiodarone. Don't think he actually had atrial fibrillation but had very frequent PACs  His chest x-ray shows a small left pleural effusion, markedly improved compared to his last chest x-ray. He has moderate cardiomegaly, substantially increased cardiac silhouette size compared with last year, although not much different from chest x-ray performed January 25. His echocardiogram during her last hospitalization showed EF 55-60 percent but left ventriculogram performed on  the same day during cardiac catheterization showed an EF of 30-35 percent. Intraoperative TEE after bypass surgery showed improvement in LVEF.  His ECG shows nonspecific ST segment changes, improved from his recent hospitalization. His cardiac troponin I level is 0.12, decreased from a peak of 7.78 on January 26. at the time of his discharge.  Additional medical problems include insulin requiring diabetes mellitus and bipolar disorder on lithium. He bears a questionable diagnosis of rheumatoid arthritis. 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 He smokes cigars.   The history pain was felt to be atypical. It was felt the patient appeared to be dry, his Lasix was stopped. His amiodarone was also stopped given the lack of recurrence of his atrial fibrillation. Wound care was consulted to help manage his sternal wound. Overnight, his serial troponin has been mildly elevated around 0.21. The troponin was likely trending down from last admission and from his CABG recently. Echocardiogram obtained on 01/30/2016 showed EF 45-50%, mild hypokinesis of apical anteroseptal, inferior, inferoseptal myocardium, grade 2 diastolic dysfunction, paradoxical septal wall motion, trivial pericardial effusion was identified.   Austin Arnold for posthospital follow-up. He reports waking up frequently short of breath and having to take a deep breath. He is not orthopneic and does not feel short of breath when he lays down specifically. He was diagnosed with sleep apnea probably 8 years ago and has never used the CPAP because he did not tolerate it.   He does not have shortness of breath  At rest or with exertion. His chest is feeling better however he does reverse report clicking sensation every now and  then. He has also reported a little bit of dizziness.     The patient currently denies nausea, vomiting, fever, chest pain, cough, congestion, abdominal pain, hematochezia, melena,  lower extremity edema, claudication.  Wt Readings from Last 3 Encounters:  02/07/16 250 lb (113.399 kg)  02/03/16 244 lb (110.678 kg)  01/30/16 241 lb 14.4 oz (109.725 kg)     Past Medical History  Diagnosis Date  . Drug abuse   . Irregular heart beat   . Bipolar 1 disorder (HCC)   . Sleep apnea     DOES NOT USE CPAP  . Diabetes mellitus without complication (HCC)     INSULIN DEPENDENT  . GERD (gastroesophageal reflux disease)     Current Outpatient Prescriptions  Medication Sig Dispense Refill  . acetaminophen (TYLENOL) 325 MG tablet Take 2 tablets (650 mg total) by mouth every 4 (four) hours as needed for fever or headache.    Marland Kitchen aspirin EC 325 MG EC tablet Take 1 tablet (325 mg total) by mouth daily. 30 tablet 0  . atorvastatin (LIPITOR) 80 MG tablet Take 1 tablet (80 mg total) by mouth daily at 6 PM. 30 tablet 1  . Insulin Glargine (TOUJEO SOLOSTAR) 300 UNIT/ML SOPN Inject 48 Units into the skin daily.    Marland Kitchen lithium 600 MG capsule Take 600 mg by mouth daily.    . metFORMIN (GLUCOPHAGE-XR) 500 MG 24 hr tablet Take 1,000 mg by mouth 2 (two) times daily.    . metoprolol tartrate (LOPRESSOR) 25 MG tablet Take 1 tablet (25 mg total) by mouth 2 (two) times daily. 60 tablet 1  . nitroGLYCERIN (NITROSTAT) 0.4 MG SL tablet Place 1 tablet (0.4 mg total) under the tongue every 5 (five) minutes x 3 doses as needed for chest pain. 25 tablet 3  . promethazine (PHENERGAN) 12.5 MG tablet Take 1 tablet (12.5 mg total) by mouth every 6 (six) hours as needed for nausea or vomiting. 15 tablet 0  . traMADol (ULTRAM) 50 MG tablet Take 1-2 tablets (50-100 mg total) by mouth every 4 (four) hours as needed for moderate pain. 40 tablet 0  . traZODone (DESYREL) 100 MG tablet Take 100 mg by mouth daily.     No current facility-administered medications for this visit.    Allergies:    Allergies  Allergen Reactions  . Sulfa Antibiotics Other (See Comments)    Childhood     Social History:  The  patient  reports that he has quit smoking. His smoking use included Cigars. He has never used smokeless tobacco. He reports that he uses illicit drugs (Cocaine). He reports that he does not drink alcohol.   Family history:  History reviewed. No pertinent family history.  ROS:  Please see the history of present illness.  All other systems reviewed and negative.   PHYSICAL EXAM: VS:  BP 110/66 mmHg  Pulse 73  Ht 6\' 2"  (1.88 m)  Wt 250 lb (113.399 kg)  BMI 32.08 kg/m2 Well nourished, well developed, in no acute distress HEENT: Pupils are equal round react to light accommodation extraocular movements are intact.  Neck: no JVDNo cervical lymphadenopathy. Cardiac: Regular rate and rhythm without murmurs rubs or gallops. Lungs:  clear to auscultation bilaterally, no wheezing, rhonchi or rales Abd: soft, nontender, positive bowel sounds all quadrants, no hepatosplenomegaly Ext: no lower extremity edema.  2+ radial and dorsalis pedis pulses. Skin: warm and dry.  Sternal wound is healing well with no signs of infection. It is dry. Neuro:  Grossly  normal  EKG:   Sinus rhythm with frequent PVCs rate 73 bpm inferior lateral T-wave inersin   ASSESSMENT AND PLAN:  Problem List Items Addressed This Visit    S/P CABG x 4   Hyperlipidemia   Dyspnea   Coronary artery disease involving native coronary artery of native heart without angina pectoris   Cardiomyopathy, ischemic-EF 45-50% last echo    Other Visit Diagnoses    Sleep apnea    -  Primary    Relevant Orders    Split night study    S/P CABG (coronary artery bypass graft)        Relevant Orders    EKG 12-Lead       The patient presents for posthospital evaluation. He appears euvolemic. His main complaint is shortness of breath at night. He wakes up feeling  and needing to take multiple deep breaths. He has known sleep apnea and has not been not been using a CPAP device.   We will order and schedule a new sleep study. He is eager to   participate in stage II cardiac rehabilitation. I suspect they will be calling in the next 2-3 weeks.   Blood pressures well-controlled.   Takes Lipitor 80 mg.  The patient requested a follow-up with Dr. Royann Shivers.

## 2016-02-07 NOTE — Patient Instructions (Signed)
Medication Instructions: Please continue your current medications. No changes have been made today.  Labwork: NONE  Testing/Procedures: 1. Sleep Study - Your physician has recommended that you have a sleep study. This test records several body functions during sleep, including: brain activity, eye movement, oxygen and carbon dioxide blood levels, heart rate and rhythm, breathing rate and rhythm, the flow of air through your mouth and nose, snoring, body muscle movements, and chest and belly movement. This will be done at the [facility].  Follow-up: Wilburt Finlay, PA-C, recommends that you schedule a follow-up appointment in 3 months with Dr Royann Shivers.  If you need a refill on your cardiac medications before your next appointment, please call your pharmacy.

## 2016-02-09 ENCOUNTER — Ambulatory Visit: Payer: Self-pay | Admitting: Cardiothoracic Surgery

## 2016-02-09 ENCOUNTER — Ambulatory Visit
Admission: RE | Admit: 2016-02-09 | Discharge: 2016-02-09 | Disposition: A | Discharge status: Home / Self Care | Payer: Medicare Other | Source: Ambulatory Visit | Attending: Cardiothoracic Surgery | Admitting: Cardiothoracic Surgery

## 2016-02-09 DIAGNOSIS — J90 Pleural effusion, not elsewhere classified: Secondary | ICD-10-CM | POA: Diagnosis not present

## 2016-02-09 DIAGNOSIS — Z951 Presence of aortocoronary bypass graft: Secondary | ICD-10-CM

## 2016-02-10 ENCOUNTER — Telehealth: Payer: Self-pay | Admitting: *Deleted

## 2016-02-10 DIAGNOSIS — M216X1 Other acquired deformities of right foot: Secondary | ICD-10-CM | POA: Diagnosis not present

## 2016-02-10 DIAGNOSIS — M216X2 Other acquired deformities of left foot: Secondary | ICD-10-CM | POA: Diagnosis not present

## 2016-02-10 DIAGNOSIS — L602 Onychogryphosis: Secondary | ICD-10-CM | POA: Diagnosis not present

## 2016-02-10 DIAGNOSIS — M2011 Hallux valgus (acquired), right foot: Secondary | ICD-10-CM | POA: Diagnosis not present

## 2016-02-10 DIAGNOSIS — E1140 Type 2 diabetes mellitus with diabetic neuropathy, unspecified: Secondary | ICD-10-CM | POA: Diagnosis not present

## 2016-02-10 DIAGNOSIS — L97519 Non-pressure chronic ulcer of other part of right foot with unspecified severity: Secondary | ICD-10-CM | POA: Diagnosis not present

## 2016-02-10 NOTE — Telephone Encounter (Signed)
Signed authorization for Phase II Cardiac Rehab faxed.

## 2016-02-11 ENCOUNTER — Other Ambulatory Visit: Payer: Self-pay | Admitting: Cardiothoracic Surgery

## 2016-02-11 ENCOUNTER — Other Ambulatory Visit: Payer: Self-pay | Admitting: *Deleted

## 2016-02-11 DIAGNOSIS — Z951 Presence of aortocoronary bypass graft: Secondary | ICD-10-CM

## 2016-02-11 NOTE — Patient Outreach (Signed)
Transition of care call - Pt reports he is doing well. He no longer has any drainage at his surgical site. He does report some SOB with walking occasionally. He is being set up for cardiac rehabilitation. He will see his cardiologist on Monday. I will see pt on Thursday at 11:00 am.   Almetta Lovely [facility] Care Manager [phone]

## 2016-02-14 ENCOUNTER — Encounter: Payer: Self-pay | Admitting: Cardiothoracic Surgery

## 2016-02-14 ENCOUNTER — Ambulatory Visit (INDEPENDENT_AMBULATORY_CARE_PROVIDER_SITE_OTHER): Payer: Self-pay | Admitting: Cardiothoracic Surgery

## 2016-02-14 VITALS — BP 103/71 | HR 80 | Resp 20 | Ht 74.0 in | Wt 250.0 lb

## 2016-02-14 DIAGNOSIS — Z951 Presence of aortocoronary bypass graft: Secondary | ICD-10-CM

## 2016-02-14 MED ORDER — TRAMADOL HCL 50 MG PO TABS: 50.0000 mg | ORAL_TABLET | ORAL | Status: DC | PRN

## 2016-02-14 NOTE — Progress Notes (Signed)
PCP is [name], MD Referring Provider is Lyn Records, MD  Chief Complaint  Patient presents with  . Routine Post Op    f/u from surgery with CXR s/p Coronary artery bypass grafting x4, 01/20/2016     HPI: First office followup one month after multivessel CABG Patient is a diabetic smoker who presented with unstable angina, non-ST elevation MI. His targets for grafting are suboptimal and his LV function was  moderately reduced.he is ready to start outpatient cardiac rehabilitation. He denies any chest pain during his 10-15 minute walks. He has had some pain at night after supper. His incision is well-healed and I reviewed his last chest x-ray which is clear with out pleural effusion. No symptoms of CHF. He seems anxious and has had troubles with drug abuse in the past.  Past Medical History  Diagnosis Date  . Drug abuse   . Irregular heart beat   . Bipolar 1 disorder (HCC)   . Sleep apnea     DOES NOT USE CPAP  . Diabetes mellitus without complication (HCC)     INSULIN DEPENDENT  . GERD (gastroesophageal reflux disease)     Past Surgical History  Procedure Laterality Date  . Leg surgery    . Neck surgery    . Nasal sinus surgery    . Wrist surgery    . Cholecystectomy    . Cardiac catheterization N/A 01/13/2016    Procedure: Left Heart Cath and Coronary Angiography;  Surgeon: Lyn Records, MD;  Location: [facility] INVASIVE CV LAB;  Service: Cardiovascular;  Laterality: N/A;  . Coronary artery bypass graft N/A 01/20/2016    Procedure: CORONARY ARTERY BYPASS GRAFTING (CABG) times four using left internal mammary artery and right saphenous vein;  Surgeon: Kerin Perna, MD;  Location: [facility] OR;  Service: Open Heart Surgery;  Laterality: N/A;  . Tee without cardioversion N/A 01/20/2016    Procedure: TRANSESOPHAGEAL ECHOCARDIOGRAM (TEE);  Surgeon: Kerin Perna, MD;  Location: [facility] OR;  Service: Open Heart Surgery;  Laterality: N/A;    No family history on file.  Social  History Social History  Substance Use Topics  . Smoking status: Former Smoker    Types: Cigars  . Smokeless tobacco: Never Used  . Alcohol Use: No    Current Outpatient Prescriptions  Medication Sig Dispense Refill  . acetaminophen (TYLENOL) 325 MG tablet Take 2 tablets (650 mg total) by mouth every 4 (four) hours as needed for fever or headache.    Marland Kitchen aspirin EC 325 MG EC tablet Take 1 tablet (325 mg total) by mouth daily. 30 tablet 0  . atorvastatin (LIPITOR) 80 MG tablet Take 1 tablet (80 mg total) by mouth daily at 6 PM. 30 tablet 1  . Insulin Glargine (TOUJEO SOLOSTAR) 300 UNIT/ML SOPN Inject 48 Units into the skin daily.    Marland Kitchen lithium 600 MG capsule Take 600 mg by mouth daily.    . metFORMIN (GLUCOPHAGE-XR) 500 MG 24 hr tablet Take 1,000 mg by mouth 2 (two) times daily.    . metoprolol tartrate (LOPRESSOR) 25 MG tablet Take 1 tablet (25 mg total) by mouth 2 (two) times daily. 60 tablet 1  . nitroGLYCERIN (NITROSTAT) 0.4 MG SL tablet Place 1 tablet (0.4 mg total) under the tongue every 5 (five) minutes x 3 doses as needed for chest pain. 25 tablet 3  . promethazine (PHENERGAN) 12.5 MG tablet Take 1 tablet (12.5 mg total) by mouth every 6 (six) hours as needed for nausea or vomiting.  15 tablet 0  . traMADol (ULTRAM) 50 MG tablet Take 1-2 tablets (50-100 mg total) by mouth every 4 (four) hours as needed for moderate pain. 40 tablet 0  . traZODone (DESYREL) 100 MG tablet Take 100 mg by mouth daily.     No current facility-administered medications for this visit.    Allergies  Allergen Reactions  . Sulfa Antibiotics Other (See Comments)    Childhood     Review of Systems  He has lost 20 pounds since surgery  but his appetite is improving.He is not sleeping more than 3-4 hours at the time which I reassured him was normal at this point. He complains of numbness over his left chest which I reassured the was normal this point. He complains of some clicking in the sternal incision which  I cannot elicit on physical exam with deep breaths  BP 103/71 mmHg  Pulse 80  Resp 20  Ht 6\' 2"  (1.88 m)  Wt 250 lb (113.399 kg)  BMI 32.08 kg/m2  SpO2 96% Physical Exam Alert but anxious no distress Lungs clear Heart regular without murmur or gallop Sternum stable well-healed Abdomen soft No peripheral edema  Diagnostic Tests: No chest x-ray today  Impression: One month after CABG, he may resume driving and normal daily activities but not lift more than 10 pounds until I see him in 4 weeks. I refilled his tramadol for his chest pain. He asked about resuming sexual relations and I told him he needed to wait 6 months after surgery. 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 Told the patient if he had persistent chest pain that he felt was similar to his angina he should contact his cardiologist to discuss repeat catheterization.   Health and safety inspector, MD [facility] [phone]

## 2016-02-16 DIAGNOSIS — F3132 Bipolar disorder, current episode depressed, moderate: Secondary | ICD-10-CM | POA: Diagnosis not present

## 2016-02-17 ENCOUNTER — Ambulatory Visit: Payer: Self-pay | Admitting: *Deleted

## 2016-02-22 DIAGNOSIS — I2510 Atherosclerotic heart disease of native coronary artery without angina pectoris: Secondary | ICD-10-CM | POA: Diagnosis not present

## 2016-02-22 DIAGNOSIS — E1165 Type 2 diabetes mellitus with hyperglycemia: Secondary | ICD-10-CM | POA: Diagnosis not present

## 2016-02-22 DIAGNOSIS — I214 Non-ST elevation (NSTEMI) myocardial infarction: Secondary | ICD-10-CM | POA: Diagnosis not present

## 2016-02-22 DIAGNOSIS — E782 Mixed hyperlipidemia: Secondary | ICD-10-CM | POA: Diagnosis not present

## 2016-02-23 ENCOUNTER — Telehealth (HOSPITAL_COMMUNITY): Payer: Self-pay | Admitting: *Deleted

## 2016-02-24 DIAGNOSIS — F3132 Bipolar disorder, current episode depressed, moderate: Secondary | ICD-10-CM | POA: Diagnosis not present

## 2016-02-28 ENCOUNTER — Other Ambulatory Visit: Payer: Self-pay | Admitting: *Deleted

## 2016-02-28 ENCOUNTER — Telehealth: Payer: Self-pay | Admitting: Cardiovascular Disease

## 2016-02-28 NOTE — Patient Outreach (Signed)
Pt cancelled appt scheduled on March 2nd as he reported starting back to work. I have attempted to call him and left messages to reschedule a home visit but have not received a return call.  Called today for pt update and possible scheduling of home visit. Pt states he is not feeling well. He said he is going to call and schedule an appointment. I asked if I could see him tomorrow morning and he agreed. I will see him at 9:00 am.  Almetta Lovely [facility] Care Manager [phone]

## 2016-02-28 NOTE — Telephone Encounter (Signed)
Flex appt set up for Wednesday at 3:30p. Pt given instructions on time/loc/provider information. Pt voiced understanding and thanks. Advised to call if further concerns in interim.

## 2016-02-28 NOTE — Telephone Encounter (Signed)
New message  Pt c/o of Chest Pain: 1. Are you having CP right now? No but the SOB yes 2. Are you experiencing any other symptoms (ex. SOB, nausea, vomiting, sweating)? SOB ( No Nausea vomiting or sweating)  3. How long have you been experiencing CP? Since the day after 01/20/2016 the surgery 4. Is your CP continuous or coming and going? Coming and going. But it hangs around for a while when it does come  5. Have you taken Nitroglycerin? No    Pt c/o Shortness Of Breath: STAT if SOB developed within the last 24 hours or pt is noticeably SOB on the phone  1. Are you currently SOB (can you hear that pt is SOB on the phone)? YES ( Cannot hear it over the phone)  2. How long have you been experiencing SOB? 01/20/2016 Since surgery  3. Are you SOB when sitting or when up moving around? All of the time he never really notices it until he has to take a deep breath to catch his breath and sometime he cant catch it he has to try at it a couple of times  4.  Are you currently experiencing any other symptoms? Depression

## 2016-02-28 NOTE — Telephone Encounter (Signed)
Spoke w/ patient who describes ongoing SOB, noted having since right after the surgery. He notes the SOB particularly on deep inspiration. He reports chest pain which comes and goes, "might be a minute or so", "progressively worse" over last month. Notes this is different than his surgical site pain. He reports taking NTG for relief, but this gives him a headache. "I'm not able to deal with the headache", patient reports.  He also notes he is "extremely depressed". He thought he would feel better after the surgery but notes he feels worse.  Patient states had discussed his concerns broadly and in detail w/ Dr. Donata Clay at his follow up 2 weeks ago, notes that Dr. Donata Clay recommended patient to call us if SOB & CP continue.  I made patient aware that I would work on getting him added for a flex clinic provider this week.  He did not feel able to wait to see Dr. Royann Shivers (next soonest appt in 10 days).  I have reached out to [facility] schedulers this AM for flex appt request and am waiting to hear back.

## 2016-02-29 ENCOUNTER — Other Ambulatory Visit: Payer: Self-pay | Admitting: *Deleted

## 2016-02-29 MED ORDER — SERTRALINE HCL 50 MG PO TABS: 50.0000 mg | ORAL_TABLET | Freq: Every day | ORAL | Status: DC

## 2016-02-29 MED ORDER — ZOLOFT 50 MG PO TABS: 50.0000 mg | ORAL_TABLET | Freq: Every day | ORAL | Status: DC

## 2016-02-29 NOTE — Patient Outreach (Signed)
[facility]) Care Management  02/29/2016  Austin Arnold [birth year] [id number]   S:  Pt reports chest pains, SOB, chest tightness on and off since surgery. Right now sitting at rest he denies pain but feels chest tightness on left side and feeling like he must take a deep breath and sometimes doesn't feel like he can get a good breath. He has also been feeling like he gets food stuck in his lower esophagus and feels like his fluids are regurgitated. He has occasional heart burn. He does take omeprazole as needed. He says his chest discomfort is more present in his daily life than it was before he had the CABG. He reports feeling very depressed because he expected to feel better. Also reports being very irritable.  O:  BP 126/66 mmHg  Pulse 72  Resp 18  Ht 1.88 m (6\' 2" )  Wt 250 lb (113.399 kg)  BMI 32.08 kg/m2  SpO2 97% FBS: 130 (has ranged from 90-168)       RRR       Lungs are clear  A:  Recent CAGB with continued chest discomfort and SOB          DM     P:  Find out if there is any way to wave the copay for cardiac rehab. (pt is not eligible for Medicaid).       Suggest taking omeprazole daily for the next 30 days.       Restart sertraline: 25 mg for 7 days and then 50 mg daily.         Pt to see Dr. tomorrow.       I will call pt on Thursday for follow up.  Thursday [facility] Care Manager [phone]

## 2016-03-01 ENCOUNTER — Ambulatory Visit (INDEPENDENT_AMBULATORY_CARE_PROVIDER_SITE_OTHER): Payer: Medicare Other | Admitting: Physician Assistant

## 2016-03-01 ENCOUNTER — Encounter: Payer: Self-pay | Admitting: Physician Assistant

## 2016-03-01 VITALS — BP 132/74 | HR 79 | Ht 74.0 in | Wt 257.0 lb

## 2016-03-01 DIAGNOSIS — I214 Non-ST elevation (NSTEMI) myocardial infarction: Secondary | ICD-10-CM | POA: Diagnosis not present

## 2016-03-01 DIAGNOSIS — R0602 Shortness of breath: Secondary | ICD-10-CM

## 2016-03-01 DIAGNOSIS — I2510 Atherosclerotic heart disease of native coronary artery without angina pectoris: Secondary | ICD-10-CM

## 2016-03-01 DIAGNOSIS — I255 Ischemic cardiomyopathy: Secondary | ICD-10-CM

## 2016-03-01 DIAGNOSIS — R0600 Dyspnea, unspecified: Secondary | ICD-10-CM

## 2016-03-01 DIAGNOSIS — E785 Hyperlipidemia, unspecified: Secondary | ICD-10-CM

## 2016-03-01 DIAGNOSIS — Z951 Presence of aortocoronary bypass graft: Secondary | ICD-10-CM | POA: Diagnosis not present

## 2016-03-01 DIAGNOSIS — R071 Chest pain on breathing: Secondary | ICD-10-CM

## 2016-03-01 MED ORDER — ROSUVASTATIN CALCIUM 40 MG PO TABS: 40.0000 mg | ORAL_TABLET | Freq: Every day | ORAL | Status: DC

## 2016-03-01 NOTE — Patient Instructions (Addendum)
Medication Instructions:  Your physician has recommended you make the following change in your medication:  1.  STOP the Lipitor 2.  START the Crestor 40 mg taking 1 tablet daily  Labwork: None ordered  Testing/Procedures: None ordered  Follow-Up: Your physician recommends that you keep your scheduled follow-up appointment in MAY WITH DR. Royann Shivers   Any Other Special Instructions Will Be Listed Below (If Applicable).    If you need a refill on your cardiac medications before your next appointment, please call your pharmacy.

## 2016-03-01 NOTE — Progress Notes (Signed)
Patient ID: Austin Arnold, male   DOB: 1966/08/28, 50 y.o.   MRN: [id number]    Date:  03/01/2016   ID:  Austin Arnold, DOB Nov 07, 1966, MRN [id number]  PCP:  [name], MD  Primary Cardiologist:  Croitoru    chief complaint: Dyspnea, CP   History of Present Illness: Austin Arnold is a 50 y.o. male  This is a 50 y.o. male with a past medical history significant for coronary artery disease status post recent acute myocardial infarction (January 25, NSTEMI) and four-vessel bypass surgery on February 2nd, discharged February 7th, returns with complaints of difficulty catching his breath, diaphoresis, stabbing chest pain which he describes a similar to his pre-bypass angina, copious clear fluid drainage from his sternotomy wound. The shortness of breath is rather unusual. He states that he has periods of 5-10 seconds at a time when he cannot catch his breath. His wife reports witnessing apnea when he is awake. He does very diagnosis of sleep apnea and has not been using CPAP. He denies fever, shaking chills, frank purulent discharge or bleeding from his sternal wound, pain on deep inspiration or cough, leg edema, palpitations or syncope. He is taking tramadol for incisional site pain. 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 He is taking antibiotics since he had low-grade fever and serosanguineous drainage from his proximal sternal incision felt to be secondary to fat necrosis. He is still taking amiodarone. Don't think he actually had atrial fibrillation but had very frequent PACs  His chest x-ray shows a small left pleural effusion, markedly improved compared to his last chest x-ray. He has moderate cardiomegaly, substantially increased cardiac silhouette size compared with last year, although not much different from chest x-ray performed January 25. His echocardiogram during her last hospitalization showed EF 55-60 percent but left ventriculogram performed on the same  day during cardiac catheterization showed an EF of 30-35 percent. Intraoperative TEE after bypass surgery showed improvement in LVEF.  His ECG shows nonspecific ST segment changes, improved from his recent hospitalization. His cardiac troponin I level is 0.12, decreased from a peak of 7.78 on January 26. at the time of his discharge.  Additional medical problems include insulin requiring diabetes mellitus and bipolar disorder on lithium. He bears a questionable diagnosis of rheumatoid arthritis. 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 He smokes cigars.   The history pain was felt to be atypical. It was felt the patient appeared to be dry, his Lasix was stopped. His amiodarone was also stopped given the lack of recurrence of his atrial fibrillation. Wound care was consulted to help manage his sternal wound. Overnight, his serial troponin has been mildly elevated around 0.21. The troponin was likely trending down from last admission and from his CABG recently. Echocardiogram obtained on 01/30/2016 showed EF 45-50%, mild hypokinesis of apical anteroseptal, inferior, inferoseptal myocardium, grade 2 diastolic dysfunction, paradoxical septal wall motion, trivial pericardial effusion was identified.  02/06/06:  Austin Arnold Presents for posthospital follow-up. He reports waking up frequently short of breath and having to take a deep breath. He is not orthopneic and does not feel short of breath when he lays down specifically. He was diagnosed with sleep apnea probably 8 years ago and has never used the CPAP because he did not tolerate it. He does not have shortness of breath At rest or with exertion. His chest is feeling better however he does reverse report clicking sensation every now and then. He has  also reported a little bit of dizziness.             03/01/16:    He presents today with similar complaints to last month.    He is continues to have chest pain left  of the sternum which is worse with deep inspiration. Apparently  It was severe over last weekend however, since that time he has not felt it.   He took tramadol which seemed to help.Marland Kitchen  He also continues to feel short of breath. I witnessed him having an episode while in the clinic. It's like he is having a similar side effect like a patient would have with Brilinta(He is not taking it).  He said he read that lipitor has that SE.  I do not see that listed.   He reports that about a week and half ago he walked up 7 flights of stairs at the [facility] office and only for short of breath as she would normally expect for walking up some any flights. He denied any exertional chest pain.    He is also been doing mild exercise as directed at [employer] without any obvious difficulties.   Last chest x-ray was february 22nd showed decrease in volume of a small left pleural effusion.  The patient currently denies nausea, vomiting, fever, orthopnea, dizziness, PND, cough, congestion, abdominal pain, hematochezia, melena, lower extremity edema, claudication.  Wt Readings from Last 3 Encounters:  03/01/16 257 lb (116.574 kg)  02/29/16 250 lb (113.399 kg)  02/14/16 250 lb (113.399 kg)     Past Medical History  Diagnosis Date  . Drug abuse   . Irregular heart beat   . Bipolar 1 disorder (HCC)   . Sleep apnea     DOES NOT USE CPAP  . Diabetes mellitus without complication (HCC)     INSULIN DEPENDENT  . GERD (gastroesophageal reflux disease)     Current Outpatient Prescriptions  Medication Sig Dispense Refill  . acetaminophen (TYLENOL) 325 MG tablet Take 2 tablets (650 mg total) by mouth every 4 (four) hours as needed for fever or headache.    . Insulin Glargine (TOUJEO SOLOSTAR) 300 UNIT/ML SOPN Inject 48 Units into the skin daily.    Marland Kitchen lithium 600 MG capsule Take 600 mg by mouth daily.    . metFORMIN (GLUCOPHAGE-XR) 500 MG 24 hr tablet Take 1,000 mg by mouth 2 (two) times daily.    . metoprolol  tartrate (LOPRESSOR) 25 MG tablet Take 1 tablet (25 mg total) by mouth 2 (two) times daily. 60 tablet 1  . nitroGLYCERIN (NITROSTAT) 0.4 MG SL tablet Place 1 tablet (0.4 mg total) under the tongue every 5 (five) minutes x 3 doses as needed for chest pain. 25 tablet 3  . traZODone (DESYREL) 100 MG tablet Take 100 mg by mouth daily.    Marland Kitchen ZOLOFT 50 MG tablet Take 1 tablet (50 mg total) by mouth daily. 90 tablet 3  . rosuvastatin (CRESTOR) 40 MG tablet Take 1 tablet (40 mg total) by mouth daily. 90 tablet 3   No current facility-administered medications for this visit.    Allergies:    Allergies  Allergen Reactions  . Sulfa Antibiotics Other (See Comments)    Childhood     Social History:  The patient  reports that he has quit smoking. His smoking use included Cigars. He has never used smokeless tobacco. He reports that he uses illicit drugs (Cocaine). He reports that he does not drink alcohol.   Family history:  No family history on file.  ROS:  Please see the history of present illness.  All other systems reviewed and negative.   PHYSICAL EXAM: VS:  BP 132/74 mmHg  Pulse 79  Ht 6\' 2"  (1.88 m)  Wt 257 lb (116.574 kg)  BMI 32.98 kg/m2  SpO2 98% Well nourished, well developed, in no acute distress HEENT: Pupils are equal round react to light accommodation extraocular movements are intact.  Neck: no JVDNo cervical lymphadenopathy. Cardiac: Regular rate and rhythm without murmurs rubs or gallops. Lungs:  clear to auscultation bilaterally, no wheezing, rhonchi or rales  chest: sternal wound is healing nicely. Abd: soft, nontender, positive bowel sounds all quadrants, no hepatosplenomegaly Ext: no lower extremity edema.  2+ radial and dorsalis pedis pulses. Skin: warm and dry Neuro:  Grossly normal  EKG:    Sinus rhythm with regular PVCs every fifth beat  ASSESSMENT AND PLAN:  Problem List Items Addressed This Visit    S/P CABG x 4   NSTEMI (non-ST elevated myocardial infarction)  (HCC)   Relevant Medications   rosuvastatin (CRESTOR) 40 MG tablet   Other Relevant Orders   EKG 12-Lead   Hyperlipidemia   Relevant Medications   rosuvastatin (CRESTOR) 40 MG tablet   Dyspnea   Coronary artery disease involving native coronary artery of native heart without angina pectoris   Relevant Medications   rosuvastatin (CRESTOR) 40 MG tablet   Chest pain   Cardiomyopathy, ischemic-EF 45-50% last echo   Relevant Medications   rosuvastatin (CRESTOR) 40 MG tablet    Other Visit Diagnoses    S/P CABG (coronary artery bypass graft)    -  Primary    Relevant Orders    EKG 12-Lead    Shortness of breath           I believe Austin Arnold's  Chest  Pain is chest wall/inflammatory related. It's worse when he takes a big breath. He was able to walk up seven flights of stairs about a week and half ago without any difficulty or chest pain. It seems to have resolved since this past weekend. I tried to reassure him.  We can consider a nuclear stress test continues.   I recommended trying Tylenol if it returns.   His dyspnea does not appear to be true dyspnea.  It is as though he just, all of a sudden takes a deep breath, and then is fine. He said he read that Lipitor can cause similar symptoms to this so we'll try changing to Crestor and see if that makes a difference. He doesn't appear to be overly anxious   Already be back to his usual activity level. His last chest x-ray showed an improving left pleural effusion which was very small.   As far as cardiac rehabilitation, he was told he has to pay $25 co-pay each visit.  He cannot afford this plus the gas drive back and forth.

## 2016-03-02 ENCOUNTER — Ambulatory Visit: Payer: Self-pay | Admitting: *Deleted

## 2016-03-03 ENCOUNTER — Other Ambulatory Visit: Payer: Self-pay | Admitting: *Deleted

## 2016-03-03 NOTE — Patient Outreach (Signed)
Called [facility] to request possible copayment waiver for this pt who will need this service but has low income and cannot afford the copayments. I spoke to [facility] and she took the information down and will forward the message to Fabio Pierce who takes care of requests like this. I left my phone number for a return call.  Almetta Lovely [facility] Care Manager [phone]

## 2016-03-06 ENCOUNTER — Other Ambulatory Visit: Payer: Self-pay | Admitting: *Deleted

## 2016-03-06 NOTE — Patient Outreach (Signed)
Telephone call: Pt had his visit with cardiology and he was reassured that his condition is stable and expected after having had CABG. Lipitor was stopped and Crestor was started. To see if this would reduce his SOB.  I advised pt I had inquired about getting the copayments for cardiac rehab waived and had been told, "No.'' But I was encouraged to call Financial Aid and talk with Limmie Patricia who can help me or send pt application for a payment plan.  Pt was agreeable to this.  I will call him again in a week to follow up.  Almetta Lovely [facility] Care Manager [phone]

## 2016-03-08 DIAGNOSIS — F3132 Bipolar disorder, current episode depressed, moderate: Secondary | ICD-10-CM | POA: Diagnosis not present

## 2016-03-13 ENCOUNTER — Ambulatory Visit: Payer: Self-pay | Admitting: *Deleted

## 2016-03-14 ENCOUNTER — Other Ambulatory Visit: Payer: Self-pay | Admitting: *Deleted

## 2016-03-15 ENCOUNTER — Encounter: Payer: Medicare Other | Admitting: Cardiothoracic Surgery

## 2016-03-17 ENCOUNTER — Other Ambulatory Visit: Payer: Self-pay | Admitting: *Deleted

## 2016-03-20 ENCOUNTER — Other Ambulatory Visit: Payer: Self-pay | Admitting: *Deleted

## 2016-03-20 NOTE — Patient Outreach (Signed)
Telephone call to pt. He reports he is still having chest pain and in fact worse that before his CABG. He is having to take NTG often. I requested that he write down all this thoughts so he can readily discuss his feelings with Dr. Morton Peters. I have advised him to request diagnostic testing to discover what his pain is coming from. He said he will. I have asked him to call me after his appointment to give me a report on what his MD says.  I have advised him I will make my final house call to him next week, April 13th at 9:00 am.  Almetta Lovely [facility] Care Manager [phone]

## 2016-03-21 NOTE — Patient Outreach (Signed)
Austin Pongratz GNP-BC [facility] Care Manager [phone]  

## 2016-03-21 NOTE — Patient Outreach (Signed)
Adlee Paar GNP-BC [facility] Care Manager [phone]  

## 2016-03-22 ENCOUNTER — Ambulatory Visit (INDEPENDENT_AMBULATORY_CARE_PROVIDER_SITE_OTHER): Payer: Self-pay | Admitting: Cardiothoracic Surgery

## 2016-03-22 ENCOUNTER — Other Ambulatory Visit: Payer: Self-pay | Admitting: *Deleted

## 2016-03-22 ENCOUNTER — Encounter: Payer: Self-pay | Admitting: Cardiothoracic Surgery

## 2016-03-22 VITALS — BP 122/69 | HR 72 | Resp 16 | Ht 74.0 in | Wt 257.0 lb

## 2016-03-22 DIAGNOSIS — I517 Cardiomegaly: Secondary | ICD-10-CM

## 2016-03-22 DIAGNOSIS — I255 Ischemic cardiomyopathy: Secondary | ICD-10-CM

## 2016-03-22 DIAGNOSIS — Z951 Presence of aortocoronary bypass graft: Secondary | ICD-10-CM

## 2016-03-22 NOTE — Progress Notes (Signed)
PCP is [name], MD Referring Provider is Lyn Records, MD  Chief Complaint  Patient presents with  . Routine Post Op    1 month f/u     HPI:2 month followup after CABG x3 for unstable angina, ejection fraction of 45-50%. Patient has had postop chest pain which is difficult to sort out from musculoskeletal pain, neuritic-type pain versus angina. Associated with as chest pain has been sudden shortness of breath. Patient has history of bipolar disorder which makes interpretation of his chest discomfort more difficult. The patient states he feels he is getting worse than it was before surgery. He states he can walk some stairs with minimal symptoms.  We discussed starting him on some medication for neuritic pain-fibromyalgia. He has had Neurontin and Cymbalta in the past with minimal or negative effect. He is willing to try Lyrica with the understanding this could make his symptoms of bipolar disorder worse. . The patient understands he will stop the medication in that case.  We will also proceed with a Myoview scan to assess his coronary perfusion. I feel this is necessary to fully evaluate the etiology of the patient's chest pain.  He'll return the office after the Myoview scan. If this is abnormal I would recommend repeat catheterization.   Past Medical History  Diagnosis Date  . Drug abuse   . Irregular heart beat   . Bipolar 1 disorder (HCC)   . Sleep apnea     DOES NOT USE CPAP  . Diabetes mellitus without complication (HCC)     INSULIN DEPENDENT  . GERD (gastroesophageal reflux disease)     Past Surgical History  Procedure Laterality Date  . Leg surgery    . Neck surgery    . Nasal sinus surgery    . Wrist surgery    . Cholecystectomy    . Cardiac catheterization N/A 01/13/2016    Procedure: Left Heart Cath and Coronary Angiography;  Surgeon: Lyn Records, MD;  Location[facility] INVASIVE CV LAB;  Service: Cardiovascular;  Laterality: N/A;  . Coronary artery bypass  graft N/A 01/20/2016    Procedure: CORONARY ARTERY BYPASS GRAFTING (CABG) times four using left internal mammary artery and right saphenous vein;  Surgeon: Kerin Perna, MD;  Location: [facility] OR;  Service: Open Heart Surgery;  Laterality: N/A;  . Tee without cardioversion N/A 01/20/2016    Procedure: TRANSESOPHAGEAL ECHOCARDIOGRAM (TEE);  Surgeon: Kerin Perna, MD;  Location: [facility] OR;  Service: Open Heart Surgery;  Laterality: N/A;    No family history on file.  Social History Social History  Substance Use Topics  . Smoking status: Former Smoker    Types: Cigars  . Smokeless tobacco: Never Used  . Alcohol Use: No    Current Outpatient Prescriptions  Medication Sig Dispense Refill  . acetaminophen (TYLENOL) 325 MG tablet Take 2 tablets (650 mg total) by mouth every 4 (four) hours as needed for fever or headache.    . Insulin Glargine (TOUJEO SOLOSTAR) 300 UNIT/ML SOPN Inject 48 Units into the skin daily.    Marland Kitchen lithium 600 MG capsule Take 600 mg by mouth daily.    . metFORMIN (GLUCOPHAGE-XR) 500 MG 24 hr tablet Take 1,000 mg by mouth 2 (two) times daily.    . metoprolol tartrate (LOPRESSOR) 25 MG tablet Take 1 tablet (25 mg total) by mouth 2 (two) times daily. 60 tablet 1  . nitroGLYCERIN (NITROSTAT) 0.4 MG SL tablet Place 1 tablet (0.4 mg total) under the tongue every 5 (five)  minutes x 3 doses as needed for chest pain. 25 tablet 3  . rosuvastatin (CRESTOR) 40 MG tablet Take 1 tablet (40 mg total) by mouth daily. 90 tablet 3  . traZODone (DESYREL) 100 MG tablet Take 100 mg by mouth daily.    Marland Kitchen ZOLOFT 50 MG tablet Take 1 tablet (50 mg total) by mouth daily. 90 tablet 3   No current facility-administered medications for this visit.    Allergies  Allergen Reactions  . Sulfa Antibiotics Other (See Comments)    Childhood     Review of Systems   Persistent chest pain and shortness of breath Anxiety episodes Return back to work  BP 122/69 mmHg  Pulse 72  Resp 16  Ht 6\' 2"  (1.88 m)   Wt 257 lb (116.574 kg)  BMI 32.98 kg/m2  SpO2 98% Physical Exam Comfortable at rest Lungs clear Heart rhythm regular without murmur or gallop Surgical incision well-healed No significant tenderness over chest incision  Diagnostic Tests: Last chest x-ray clear  Impression: Persistent chest pain-etiology unclear. Musculoskeletal versus problem with his coronary circulation or bypass grafts. We'll proceed with Myoview scan.  Plan:return for office visit after Myoview scan.   , MD [facility] [phone]

## 2016-03-27 ENCOUNTER — Ambulatory Visit (HOSPITAL_BASED_OUTPATIENT_CLINIC_OR_DEPARTMENT_OTHER): Payer: Medicare Other | Attending: Physician Assistant | Admitting: Radiology

## 2016-03-27 ENCOUNTER — Telehealth (HOSPITAL_COMMUNITY): Payer: Self-pay | Admitting: *Deleted

## 2016-03-27 DIAGNOSIS — I493 Ventricular premature depolarization: Secondary | ICD-10-CM | POA: Insufficient documentation

## 2016-03-27 DIAGNOSIS — G4733 Obstructive sleep apnea (adult) (pediatric): Secondary | ICD-10-CM

## 2016-03-27 DIAGNOSIS — Z7984 Long term (current) use of oral hypoglycemic drugs: Secondary | ICD-10-CM | POA: Insufficient documentation

## 2016-03-27 DIAGNOSIS — Z79899 Other long term (current) drug therapy: Secondary | ICD-10-CM | POA: Insufficient documentation

## 2016-03-27 DIAGNOSIS — R0683 Snoring: Secondary | ICD-10-CM | POA: Diagnosis not present

## 2016-03-27 DIAGNOSIS — G4730 Sleep apnea, unspecified: Secondary | ICD-10-CM

## 2016-03-27 DIAGNOSIS — R5383 Other fatigue: Secondary | ICD-10-CM | POA: Diagnosis not present

## 2016-03-27 DIAGNOSIS — Z794 Long term (current) use of insulin: Secondary | ICD-10-CM | POA: Diagnosis not present

## 2016-03-27 DIAGNOSIS — E119 Type 2 diabetes mellitus without complications: Secondary | ICD-10-CM | POA: Insufficient documentation

## 2016-03-27 NOTE — Telephone Encounter (Signed)
Left message on voicemail in reference to upcoming appointment scheduled for 03/29/16. Phone number given for a call back so details instructions can be given. Austin Arnold W   

## 2016-03-28 ENCOUNTER — Telehealth (HOSPITAL_COMMUNITY): Payer: Self-pay | Admitting: *Deleted

## 2016-03-28 DIAGNOSIS — Z79899 Other long term (current) drug therapy: Secondary | ICD-10-CM | POA: Diagnosis not present

## 2016-03-28 DIAGNOSIS — R768 Other specified abnormal immunological findings in serum: Secondary | ICD-10-CM | POA: Diagnosis not present

## 2016-03-28 DIAGNOSIS — M2550 Pain in unspecified joint: Secondary | ICD-10-CM | POA: Diagnosis not present

## 2016-03-28 DIAGNOSIS — F3132 Bipolar disorder, current episode depressed, moderate: Secondary | ICD-10-CM | POA: Diagnosis not present

## 2016-03-28 DIAGNOSIS — M2560 Stiffness of unspecified joint, not elsewhere classified: Secondary | ICD-10-CM | POA: Diagnosis not present

## 2016-03-28 NOTE — Telephone Encounter (Signed)
Left message on voicemail in reference to upcoming appointment scheduled for 03/29/16. Phone number given for a call back so details instructions can be given. Austin Arnold W   

## 2016-03-29 ENCOUNTER — Ambulatory Visit (HOSPITAL_COMMUNITY): Payer: Medicare Other | Attending: Cardiovascular Disease

## 2016-03-29 DIAGNOSIS — I517 Cardiomegaly: Secondary | ICD-10-CM

## 2016-03-29 DIAGNOSIS — E109 Type 1 diabetes mellitus without complications: Secondary | ICD-10-CM | POA: Insufficient documentation

## 2016-03-29 DIAGNOSIS — R9439 Abnormal result of other cardiovascular function study: Secondary | ICD-10-CM | POA: Diagnosis not present

## 2016-03-29 DIAGNOSIS — I255 Ischemic cardiomyopathy: Secondary | ICD-10-CM

## 2016-03-29 DIAGNOSIS — R079 Chest pain, unspecified: Secondary | ICD-10-CM | POA: Diagnosis not present

## 2016-03-29 DIAGNOSIS — R5383 Other fatigue: Secondary | ICD-10-CM | POA: Diagnosis not present

## 2016-03-29 LAB — MYOCARDIAL PERFUSION IMAGING
LV dias vol: 184 mL (ref 62–150)
LV sys vol: 100 mL
Peak HR: 85 {beats}/min
RATE: 0.3
Rest HR: 64 {beats}/min
SDS: 1
SRS: 4
SSS: 5
TID: 1.02

## 2016-03-29 MED ORDER — TECHNETIUM TC 99M SESTAMIBI GENERIC - CARDIOLITE
32.4000 | Freq: Once | INTRAVENOUS | Status: AC | PRN
  Administered 2016-03-29: 32 via INTRAVENOUS

## 2016-03-29 MED ORDER — REGADENOSON 0.4 MG/5ML IV SOLN
0.4000 mg | Freq: Once | INTRAVENOUS | Status: AC
  Administered 2016-03-29: 0.4 mg via INTRAVENOUS

## 2016-03-29 MED ORDER — TECHNETIUM TC 99M SESTAMIBI GENERIC - CARDIOLITE
10.8000 | Freq: Once | INTRAVENOUS | Status: AC | PRN
  Administered 2016-03-29: 11 via INTRAVENOUS

## 2016-03-30 ENCOUNTER — Other Ambulatory Visit: Payer: Self-pay | Admitting: *Deleted

## 2016-03-30 ENCOUNTER — Encounter: Payer: Self-pay | Admitting: *Deleted

## 2016-03-30 NOTE — Patient Outreach (Signed)
[facility]) Care Management  03/30/2016  Austin Arnold Sep 01, 1966 [id number]   S:  Pt reports less CP over the last month. He still has SOB which comes and goes and it doesn't matter what he is doing. He can be moving or sitting still. He complains of muscle and joint aches. He has seen a rheumatologist and he was told he has RA. He was prescribed plaquenil, which he cannot afford the copay. He was also previously prescribed Lyrica for neuropathy and he cannot afford this either. He still complains of depression related to his health and poor quality of life at present. He says his work is going well and he is not overexerting. He reports he is getting along at home with his wife, although he is irritable. He continues to abstain from smoking. His wife says he is not active enough, not enjoying life, not doing enjoyable things outside like he used to.  O:  BP 120/64 mmHg  Pulse 75  Resp 18  Wt 249 lb (112.946 kg)  SpO2 98% FBS 159       RRR       Lungs are clear       No edema       R great toe wound nearly healed - very small area only 51mm scab. No redness or drainage.  A:  CAD       DM       Depression       Financially challenged  P:  Will refer for pharmacy assistance.       Encouraged pt to comply with his diabetic diet to maintain good control, avoid complications and            continue improving his quality of life.       Encouraged outdoors activities that he used to enjoy.       I will see him one more time and then refer him to a HEALTH COACH.  Almetta Lovely [facility] Care Manager [phone]

## 2016-04-03 ENCOUNTER — Telehealth: Payer: Self-pay | Admitting: Cardiovascular Disease

## 2016-04-03 ENCOUNTER — Telehealth: Payer: Self-pay

## 2016-04-03 DIAGNOSIS — F3132 Bipolar disorder, current episode depressed, moderate: Secondary | ICD-10-CM | POA: Diagnosis not present

## 2016-04-03 NOTE — Telephone Encounter (Signed)
lmtcb  Patient can either keep his 5/1 appointment with [facility] or can be squeezed in on Friday 4/28.

## 2016-04-03 NOTE — Telephone Encounter (Signed)
New message    Pt calling to speak to rn  He said he is returning call

## 2016-04-03 NOTE — Telephone Encounter (Signed)
From: Lyn Records, MD     Sent: 03/31/2016  5:21 PM      To: Jarvis Newcomer, CMA        Just realized he is Dr. Erin Hearing patient.    I'm asking Dr. Salena Saner. To see him.    ----- Message -----     From: Jarvis Newcomer, CMA     Sent: 03/30/2016  3:35 PM      To: Lyn Records, MD        Hey Dr.Smith,        You will only be in the office 1 and 1/4 day the rest of this month        4/24 quarter morning ( last pt @10 :15am)        4/28 all day (24 patients)        When would you like to see this pt. Is it ok to go out to 4/28    ----- Message -----     From: 5/28, MD     Sent: 03/29/2016  7:03 PM      To: 05/29/2016, CMA        I need to see him ASAP.    ----- Message -----     From: Jarvis Newcomer, MD     Sent: 03/29/2016  6:17 PM      To: 05/29/2016, MD        Hank - could you look athe myoview done this week and talk to this anxious patient[ about possible re-cath or Cardiac CT] with chest pain and SOB after CABG for a completed anterior MI? Thanks    Lyn Records trigt

## 2016-04-04 NOTE — Telephone Encounter (Signed)
Marzella Schlein Truitt, CMA at 04/04/2016 11:00 AM     Status: Signed       Expand All Collapse All   Patient will come in 4/28 at 9:15a. Message routed to Dr Renaye Rakers scheduler to add patient to schedule.            Marzella Schlein Truitt, CMA at 04/04/2016 10:28 AM     Status: Signed       Expand All Collapse All   Patient returned call.  Notified patient of Dr VanTright's recommendation and notified him that he had an appointment with [facility] on 5/1 but he could be seen sooner on 4/28.   Patient will clear it with his boss and call back with his decision.            Lincoln Maxin at 04/03/2016 4:48 PM     Status: Signed       Expand All Collapse All   New message    Pt calling to speak to rn  He said he is returning call

## 2016-04-04 NOTE — Telephone Encounter (Signed)
Patient will come in 4/28 at 9:15a. Message routed to Dr Renaye Rakers scheduler to add patient to schedule.

## 2016-04-04 NOTE — Telephone Encounter (Signed)
Patient returned call.  Notified patient of Dr VanTright's recommendation and notified him that he had an appointment with [facility] on 5/1 but he could be seen sooner on 4/28.   Patient will clear it with his boss and call back with his decision.

## 2016-04-05 ENCOUNTER — Encounter: Payer: Self-pay | Admitting: Cardiothoracic Surgery

## 2016-04-05 ENCOUNTER — Ambulatory Visit (INDEPENDENT_AMBULATORY_CARE_PROVIDER_SITE_OTHER): Payer: Self-pay | Admitting: Cardiothoracic Surgery

## 2016-04-05 ENCOUNTER — Other Ambulatory Visit: Payer: Self-pay | Admitting: *Deleted

## 2016-04-05 VITALS — BP 126/81 | HR 76 | Resp 20 | Ht 74.0 in | Wt 250.0 lb

## 2016-04-05 DIAGNOSIS — Z951 Presence of aortocoronary bypass graft: Secondary | ICD-10-CM

## 2016-04-05 DIAGNOSIS — I2699 Other pulmonary embolism without acute cor pulmonale: Secondary | ICD-10-CM

## 2016-04-05 NOTE — Progress Notes (Signed)
PCP is [name], MD Referring Provider is Lyn Records, MD  Chief Complaint  Patient presents with  . Routine Post Op    2 week f/u disucss Myoview scan    DVV:OHYWVPX returns for discussion of atypical chest pain and shortness of breath after CABG for unstable angina, non-ST elevation MI. Since last visit he underwent a stress myocardial perfusion scan fascia as this was negative for ischemia. Ejection fraction is 45% which was his baseline. There is evidence of an old scar but no ischemia.  The patient was to try Lyrica for his atypical chest pain but cannot afford the prescription Patient has no evidence of sternal malunion on exam or chest x-ray Is difficult to sort out the exact nature of this chest pain since he has bipolar on meds and has had drug abuse history. Patient has not had a CT scan of the chest so we will perform that to rule out PE or other pulmonary issues.I angulated the patient for a brisk walk of 300 feet around the office and his oxygen saturation change from 98% at rest to 95% post exercise He is scheduled to visit his cardiologist for an office visit in the near future as well. I reassured the patient that having a negative stress test is very good news in regards to his heart.   Past Medical History  Diagnosis Date  . Drug abuse   . Irregular heart beat   . Bipolar 1 disorder (HCC)   . Sleep apnea     DOES NOT USE CPAP  . Diabetes mellitus without complication (HCC)     INSULIN DEPENDENT  . GERD (gastroesophageal reflux disease)     Past Surgical History  Procedure Laterality Date  . Leg surgery    . Neck surgery    . Nasal sinus surgery    . Wrist surgery    . Cholecystectomy    . Cardiac catheterization N/A 01/13/2016    Procedure: Left Heart Cath and Coronary Angiography;  Surgeon: Lyn Records, MD;  Location: [facility] INVASIVE CV LAB;  Service: Cardiovascular;  Laterality: N/A;  . Coronary artery bypass graft N/A 01/20/2016    Procedure:  CORONARY ARTERY BYPASS GRAFTING (CABG) times four using left internal mammary artery and right saphenous vein;  Surgeon: Kerin Perna, MD;  Location: [facility] OR;  Service: Open Heart Surgery;  Laterality: N/A;  . Tee without cardioversion N/A 01/20/2016    Procedure: TRANSESOPHAGEAL ECHOCARDIOGRAM (TEE);  Surgeon: Kerin Perna, MD;  Location: [facility] OR;  Service: Open Heart Surgery;  Laterality: N/A;    No family history on file.  Social History Social History  Substance Use Topics  . Smoking status: Former Smoker    Types: Cigars  . Smokeless tobacco: Never Used  . Alcohol Use: No    Current Outpatient Prescriptions  Medication Sig Dispense Refill  . acetaminophen (TYLENOL) 325 MG tablet Take 2 tablets (650 mg total) by mouth every 4 (four) hours as needed for fever or headache.    . Insulin Glargine (TOUJEO SOLOSTAR) 300 UNIT/ML SOPN Inject 48 Units into the skin daily.    Marland Kitchen lithium 600 MG capsule Take 600 mg by mouth daily.    . metFORMIN (GLUCOPHAGE-XR) 500 MG 24 hr tablet Take 1,000 mg by mouth 2 (two) times daily.    . metoprolol tartrate (LOPRESSOR) 25 MG tablet Take 1 tablet (25 mg total) by mouth 2 (two) times daily. 60 tablet 1  . nitroGLYCERIN (NITROSTAT) 0.4 MG SL tablet Place  1 tablet (0.4 mg total) under the tongue every 5 (five) minutes x 3 doses as needed for chest pain. 25 tablet 3  . predniSONE (DELTASONE) 5 MG tablet Take 5 mg by mouth 2 (two) times daily with a meal.    . rosuvastatin (CRESTOR) 40 MG tablet Take 1 tablet (40 mg total) by mouth daily. 90 tablet 3  . traZODone (DESYREL) 100 MG tablet Take 100 mg by mouth daily.    Marland Kitchen ZOLOFT 50 MG tablet Take 1 tablet (50 mg total) by mouth daily. 90 tablet 3   No current facility-administered medications for this visit.    Allergies  Allergen Reactions  . Sulfa Antibiotics Other (See Comments)    Childhood     Review of Systems  Persistent atypical chest pain with shortness of breath  BP 126/81 mmHg  Pulse 76   Resp 20  Ht 6\' 2"  (1.88 m)  Wt 250 lb (113.399 kg)  BMI 32.08 kg/m2  SpO2 99% Physical Exam       Exam    General- alert and comfortable   Lungs- clear without rales, wheezes   Cor- regular rate and rhythm, no murmur , gallop   Abdomen- soft, non-tender   Extremities - warm, non-tender, minimal edema   Neuro- oriented, appropriate, no focal weakness   Diagnostic Tests: Stress myocardial perfusion scan reviewed and counseled the patient  Impression: Atypical chest pain and shortness of breath after CABG for unstable angina, non-ST elevation MI  Plan:we will evaluate his sternal incision and pulmonary parenchymal and vascular status with a CT scan with contrast. Return in 2 weeks   , MD [facility] [phone]

## 2016-04-07 ENCOUNTER — Other Ambulatory Visit: Payer: Self-pay | Admitting: Cardiothoracic Surgery

## 2016-04-07 ENCOUNTER — Ambulatory Visit
Admission: RE | Admit: 2016-04-07 | Discharge: 2016-04-07 | Disposition: A | Discharge status: Home / Self Care | Payer: Medicare Other | Source: Ambulatory Visit | Attending: Cardiothoracic Surgery | Admitting: Cardiothoracic Surgery

## 2016-04-07 ENCOUNTER — Other Ambulatory Visit: Payer: Self-pay | Admitting: *Deleted

## 2016-04-07 DIAGNOSIS — I2699 Other pulmonary embolism without acute cor pulmonale: Secondary | ICD-10-CM

## 2016-04-07 DIAGNOSIS — R0602 Shortness of breath: Secondary | ICD-10-CM | POA: Diagnosis not present

## 2016-04-07 DIAGNOSIS — Z951 Presence of aortocoronary bypass graft: Secondary | ICD-10-CM

## 2016-04-07 MED ORDER — IOPAMIDOL (ISOVUE-370) INJECTION 76%
100.0000 mL | Freq: Once | INTRAVENOUS | Status: AC | PRN
  Administered 2016-04-07: 100 mL via INTRAVENOUS

## 2016-04-09 NOTE — Progress Notes (Signed)
Patient ID: Joziyah Roblero, male   DOB: 09-06-1966, 50 y.o.   MRN: [id number]        Patient Name: Domique, Clapper Date: 03/27/2016 Gender: Male D.O.B: [birth year] Age (years): 79 Referring Provider: Tarri Fuller Height (inches): 74 Interpreting Physician: Shelva Majestic MD, ABSM Weight (lbs): 250 RPSGT: Laren Everts BMI: 32 MRN: [id number] Neck Size: 16.50  CLINICAL INFORMATION Sleep Study Type: Split Night CPAP Indication for sleep study: Diabetes, Fatigue, OSA, Snoring, Witnessed Apneas Epworth Sleepiness Score: 5  SLEEP STUDY TECHNIQUE As per the AASM Manual for the Scoring of Sleep and Associated Events v2.3 (April 2016) with a hypopnea requiring 4% desaturations. The channels recorded and monitored were frontal, central and occipital EEG, electrooculogram (EOG), submentalis EMG (chin), nasal and oral airflow, thoracic and abdominal wall motion, anterior tibialis EMG, snore microphone, electrocardiogram, and pulse oximetry. Continuous positive airway pressure (CPAP) was initiated when the patient met split night criteria and was titrated according to treat sleep-disordered breathing.  MEDICATIONS  acetaminophen (TYLENOL) 325 MG tablet 650 mg, Every 4 hours PRN     Insulin Glargine (TOUJEO SOLOSTAR) 300 UNIT/ML SOPN 48 Units, Daily     lithium 600 MG capsule 600 mg, Daily     metFORMIN (GLUCOPHAGE-XR) 500 MG 24 hr tablet 1,000 mg, 2 times daily     metoprolol tartrate (LOPRESSOR) 25 MG tablet 25 mg, 2 times daily     nitroGLYCERIN (NITROSTAT) 0.4 MG SL tablet 0.4 mg, Every 5 min x3 PRN     predniSONE (DELTASONE) 5 MG tablet 5 mg, 2 times daily with meals     rosuvastatin (CRESTOR) 40 MG tablet 40 mg, Daily     traZODone (DESYREL) 100 MG tablet 100 mg, Daily     ZOLOFT 50 MG tablet   Medications administered by patient during sleep study : No sleep medicine administered.  RESPIRATORY PARAMETERS Diagnostic Total AHI (/hr): 15.6 RDI (/hr): 18.2 OA Index (/hr): - CA  Index (/hr): 2.2 REM AHI (/hr): N/A NREM AHI (/hr): 15.6 Supine AHI (/hr): 22.2 Non-supine AHI (/hr): 0.00 Min O2 Sat (%): 86.00 Mean O2 (%): 94.70 Time below 88% (min): 1.9   Titration Optimal Pressure (cm): 7 AHI at Optimal Pressure (/hr): 0.0 Min O2 at Optimal Pressure (%): 94.0 Supine % at Optimal (%): 100 Sleep % at Optimal (%): 90    SLEEP ARCHITECTURE The recording time for the entire night was 392.0 minutes. During a baseline period of 159.5 minutes, the patient slept for 135.0 minutes in REM and nonREM, yielding a sleep efficiency of 84.6%. Sleep onset after lights out was 15.6 minutes with a REM latency of N/A minutes. The patient spent 11.83% of the night in stage N1 sleep, 88.17% in stage N2 sleep, 0.00% in stage N3 and 0.00% in REM. During the titration period of 227.9 minutes, the patient slept for 148.0 minutes in REM and nonREM, yielding a sleep efficiency of 64.9%. Sleep onset after CPAP initiation was 5.5 minutes with a REM latency of 198.0 minutes. The patient spent 13.85% of the night in stage N1 sleep, 85.14% in stage N2 sleep, 0.00% in stage N3 and 1.01% in REM.  CARDIAC DATA The 2 lead EKG demonstrated sinus rhythm. The mean heart rate was 64.81 beats per minute. Other EKG findings include: PVCs.  LEG MOVEMENT DATA The total Periodic Limb Movements of Sleep (PLMS) were 195. The PLMS index was 41.34 .  IMPRESSIONS - Moderate obstructive sleep apnea occurred during the diagnostic portion of the study (AHI = 15.6/hour) with  absence of REM sleep. An optimal PAP pressure was selected for this patient ( 7 cm of water). - No significant central sleep apnea occurred during the diagnostic portion of the study (CAI = 2.2/hour). - Mild oxygen desaturation to a nadir of 86% during the diagnostic portion of the study. - Abnormal sleep architecture of the baseline portion of the study with absence of slow wave and REM sleep. - The patient snored with Soft snoring volume during the  diagnostic portion of the study. - EKG findings include PVCs. - Severe periodic limb movements of sleep occurred during the study with a PLMS index of 41.34.  DIAGNOSIS - Obstructive Sleep Apnea (327.23 [G47.33 ICD-10])  RECOMMENDATIONS - Recommend an initial trial of CPAP therapy with EPR/C- FlexC at 7 cm H2O with heated humidification.  - Avoid alcohol, sedatives and other CNS depressants that may worsen sleep apnea and disrupt normal sleep architecture. - If patient is symptomatic with restless legs on CPAP therapy, consider trial of medical therapy with a PLMS index of 41.34. - Sleep hygiene should be reviewed to assess factors that may improve sleep quality. - Weight management and regular exercise should be initiated or continued. - Reccommend a download be obtained in 30 days and sleep clinic evaluation.  Troy Sine, MD, [place], American Board of Sleep Medicine  ELECTRONICALLY SIGNED ON:  04/09/2016, 8:36 PM [place] PH: [phone]   FX: [phone] [place]

## 2016-04-10 ENCOUNTER — Other Ambulatory Visit: Payer: Self-pay | Admitting: Physician Assistant

## 2016-04-12 ENCOUNTER — Other Ambulatory Visit: Payer: Self-pay | Admitting: *Deleted

## 2016-04-14 ENCOUNTER — Ambulatory Visit (INDEPENDENT_AMBULATORY_CARE_PROVIDER_SITE_OTHER): Payer: Medicare Other | Admitting: Cardiovascular Disease

## 2016-04-14 ENCOUNTER — Encounter: Payer: Self-pay | Admitting: Cardiovascular Disease

## 2016-04-14 VITALS — BP 116/64 | HR 87 | Ht 74.0 in | Wt 242.8 lb

## 2016-04-14 DIAGNOSIS — E785 Hyperlipidemia, unspecified: Secondary | ICD-10-CM

## 2016-04-14 DIAGNOSIS — I255 Ischemic cardiomyopathy: Secondary | ICD-10-CM

## 2016-04-14 DIAGNOSIS — I2510 Atherosclerotic heart disease of native coronary artery without angina pectoris: Secondary | ICD-10-CM

## 2016-04-14 DIAGNOSIS — R071 Chest pain on breathing: Secondary | ICD-10-CM | POA: Diagnosis not present

## 2016-04-14 NOTE — Progress Notes (Signed)
Patient ID: Austin Arnold, male   DOB: Jul 13, 1966, 50 y.o.   MRN: [id number]    Cardiology Office Note    Date:  04/14/2016   ID:  Austin Arnold, DOB 07-Dec-1966, MRN [id number]  PCP:  [name], MD  Cardiologist:   Thurmon Fair, MD   Chief Complaint  Patient presents with  . Follow-up    3 months    History of Present Illness:  [name] is a 50 y.o. male with recently diagnosed multivessel coronary artery disease who underwent coronary bypass surgery with Dr. Donata Clay roughly 3 months ago. He presented with a non-ST segment elevation myocardial infarction on January 5 and surgery was performed on 01/20/2016  He continues to have complaints of chest discomfort. The chest pain is often triggered by taking a deep breath and is very sharp. It sounds clearly pleuritic, but Tadarius states that it is very similar to the pain that led to his initial evaluation for coronary disease. His complaints have been so persistent that he eventually underwent a nuclear stress test on April 12. This did not show any evidence of reversible ischemia. There is a very small fixed apical defect and left ventricular systolic function is mildly depressed at 46%, which is similar to his postoperative ejection fraction by echo on 01/30/2016. He also underwent a CT angiogram of the chest on April 21, with benign findings.  Financial reasons [name] has not been able to participate in cardiac rehabilitation (for similar reasons he is not taking Brilinta). He has been walking and physical activity seems to have no impact on his chest discomfort. He does not have evidence of orthopnea or exertional dyspnea. Sometimes he does feel more comfortable if he leans forward. 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  He also bears a diagnosis of rheumatoid arthritis and is taking a low-dose of steroids (prednisone 5 mg twice daily). He has type 2 diabetes mellitus on metformin and insulin and  is taking high-dose rosuvastatin for hyperlipidemia. He has a history of bipolar disorder managed with a combination of lithium and trazodone. He recently underwent a sleep study that confirmed the diagnosis of moderate obstructive sleep apnea (AHI=15.6/h). There was also evidence of severe periodic limb movements during sleep. He is not yet wearing CPAP.    Past Medical History  Diagnosis Date  . Drug abuse   . Irregular heart beat   . Bipolar 1 disorder (HCC)   . Sleep apnea     DOES NOT USE CPAP  . Diabetes mellitus without complication (HCC)     INSULIN DEPENDENT  . GERD (gastroesophageal reflux disease)     Past Surgical History  Procedure Laterality Date  . Leg surgery    . Neck surgery    . Nasal sinus surgery    . Wrist surgery    . Cholecystectomy    . Cardiac catheterization N/A 01/13/2016    Procedure: Left Heart Cath and Coronary Angiography;  Surgeon: Lyn Records, MD;  Location: [facility] INVASIVE CV LAB;  Service: Cardiovascular;  Laterality: N/A;  . Coronary artery bypass graft N/A 01/20/2016    Procedure: CORONARY ARTERY BYPASS GRAFTING (CABG) times four using left internal mammary artery and right saphenous vein;  Surgeon: Kerin Perna, MD;  Location: [facility] OR;  Service: Open Heart Surgery;  Laterality: N/A;  . Tee without cardioversion N/A 01/20/2016    Procedure: TRANSESOPHAGEAL ECHOCARDIOGRAM (TEE);  Surgeon: Kerin Perna, MD;  Location: [facility] OR;  Service: Open Heart Surgery;  Laterality: N/A;    Current Medications: Outpatient Prescriptions Prior to Visit  Medication Sig Dispense Refill  . acetaminophen (TYLENOL) 325 MG tablet Take 2 tablets (650 mg total) by mouth every 4 (four) hours as needed for fever or headache.    . Insulin Glargine (TOUJEO SOLOSTAR) 300 UNIT/ML SOPN Inject 48 Units into the skin daily.    Marland Kitchen lithium 600 MG capsule Take 600 mg by mouth daily.    . metFORMIN (GLUCOPHAGE-XR) 500 MG 24 hr tablet Take 1,000 mg by mouth 2 (two) times daily.    .  metoprolol tartrate (LOPRESSOR) 25 MG tablet Take 1 tablet (25 mg total) by mouth 2 (two) times daily. 60 tablet 1  . nitroGLYCERIN (NITROSTAT) 0.4 MG SL tablet Place 1 tablet (0.4 mg total) under the tongue every 5 (five) minutes x 3 doses as needed for chest pain. 25 tablet 3  . predniSONE (DELTASONE) 5 MG tablet Take 5 mg by mouth 2 (two) times daily with a meal.    . rosuvastatin (CRESTOR) 40 MG tablet Take 1 tablet (40 mg total) by mouth daily. 90 tablet 3  . traZODone (DESYREL) 100 MG tablet Take 100 mg by mouth daily.    Marland Kitchen ZOLOFT 50 MG tablet Take 1 tablet (50 mg total) by mouth daily. 90 tablet 3   No facility-administered medications prior to visit.     Allergies:   Sulfa antibiotics   Social History   Social History  . Marital Status: Married    Spouse Name: N/A  . Number of Children: N/A  . Years of Education: N/A   Social History Main Topics  . Smoking status: Former Smoker    Types: Cigars  . Smokeless tobacco: Never Used  . Alcohol Use: No  . Drug Use: Yes    Special: Cocaine  . Sexual Activity: Not Asked   Other Topics Concern  . None   Social History Narrative      ROS:   Please see the history of present illness.    ROS All other systems reviewed and are negative.   PHYSICAL EXAM:   VS:  BP 116/64 mmHg  Pulse 87  Ht 6\' 2"  (1.88 m)  Wt 110.133 kg (242 lb 12.8 oz)  BMI 31.16 kg/m2   GEN: Well nourished, well developed, in no acute distress HEENT: normal Neck: no JVD, carotid bruits, or masses Cardiac: RRR; no murmurs, rubs, or gallops,no edema  Respiratory:  clear to auscultation bilaterally, normal work of breathing, well-healed sternotomy scar  GI: soft, nontender, nondistended, + BS MS: no deformity or atrophy Skin: warm and dry, no rash Neuro:  Alert and Oriented x 3, Strength and sensation are intact Psych: euthymic mood, full affect  Wt Readings from Last 3 Encounters:  04/14/16 110.133 kg (242 lb 12.8 oz)  04/05/16 113.399 kg (250 lb)    03/30/16 112.946 kg (249 lb)      Studies/Labs Reviewed:   EKG:  EKG is not ordered today.    Recent Labs: 01/12/2016: B Natriuretic Peptide 85.2 01/13/2016: ALT 20 01/23/2016: TSH 2.243 01/29/2016: BUN 21*; Creatinine, Ser 1.25*; Magnesium 1.9; Potassium 4.3; Sodium 135 01/30/2016: Hemoglobin 11.7*; Platelets 346   Lipid Panel    Component Value Date/Time   CHOL 188 01/13/2016 0342   TRIG 326* 01/13/2016 0342   HDL 34* 01/13/2016 0342   CHOLHDL 5.5 01/13/2016 0342   VLDL 65* 01/13/2016 0342   LDLCALC 89 01/13/2016 0342    Additional studies/ records that were reviewed today include:  Notes from his visit with his cardiac surgeon, CT angiography of the chest performed April 21, sleep study   ASSESSMENT:    1. Coronary artery disease involving native coronary artery of native heart without angina pectoris   2. Cardiomyopathy, ischemic-EF 45-50% last echo   3. Chest pain on breathing   4. Hyperlipidemia      PLAN:  In order of problems listed above:  1. CAD s/p CABG - although Ronyn reports that his symptoms reminded him of his preoperative angina, the way he describes his symptoms sounds decidedly non-coronary. His low risk nuclear stress test is reassuring. 2. CMP - despite revascularization, Jayven seems to have a very mild ischemic cardiomyopathy due to apical scar. This is unlikely to progress. He is currently taking beta blockers but his blood pressure has limited treatment with ace inhibitors. Would try to introduce this treatment in the future as allowed by his blood pressure. He does not have any clinical findings to suggest congestive heart failure 3. 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 It is quite possible that he has post cardiotomy syndrome with acute pericarditis and/or pleuritis. We'll temporarily increase his dose of prednisone to 20 mg daily for 1 week. Will decide on further treatment depending on his response to  this treatment. 4. HLP - target LDL cholesterol less than 70. Recheck before his next appointment.   We'll try to respect Johnryan's concern about financial issues and adjust medications and perform follow-up as much as possible by phone. He has difficulty affording co-pays and the cost of gas for transportation.  Medication Adjustments/Labs and Tests Ordered: Current medicines are reviewed at length with the patient today.  Concerns regarding medicines are outlined above.  Medication changes, Labs and Tests ordered today are listed in the Patient Instructions below. 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 INCREASE Prednisone to 2 tablets (10 mg total) twice daily for 1 week. Please call the office in 1 week and let us know how you are feeling.  Your physician recommends that you return for lab work in 6 months prior to seeing Dr C.  Dr Royann Shivers recommends that you schedule a follow-up appointment in 6 months. You will receive a reminder letter in the mail two months in advance. If you don't receive a letter, please call our office to schedule the follow-up appointment.  If you need a refill on your cardiac medications before your next appointment, please call your pharmacy.    Joie Bimler, MD  04/14/2016 6:45 PM    [facility] [address] [place], [place], [place]  [address] Phone: [phone]; Fax: [phone]

## 2016-04-14 NOTE — Patient Instructions (Signed)
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 INCREASE Prednisone to 2 tablets (10 mg total) twice daily for 1 week. Please call the office in 1 week and let us know how you are feeling.  Your physician recommends that you return for lab work in 6 months prior to seeing Dr C.  Dr Royann Shivers recommends that you schedule a follow-up appointment in 6 months. You will receive a reminder letter in the mail two months in advance. If you don't receive a letter, please call our office to schedule the follow-up appointment.  If you need a refill on your cardiac medications before your next appointment, please call your pharmacy.

## 2016-04-17 ENCOUNTER — Other Ambulatory Visit: Payer: Self-pay | Admitting: Physician Assistant

## 2016-04-17 ENCOUNTER — Ambulatory Visit: Payer: Medicare Other | Admitting: Cardiovascular Disease

## 2016-04-17 DIAGNOSIS — F3132 Bipolar disorder, current episode depressed, moderate: Secondary | ICD-10-CM | POA: Diagnosis not present

## 2016-04-19 ENCOUNTER — Encounter: Payer: Medicare Other | Admitting: Cardiothoracic Surgery

## 2016-04-19 ENCOUNTER — Telehealth: Payer: Self-pay | Admitting: *Deleted

## 2016-04-19 NOTE — Telephone Encounter (Signed)
-----   Message from Lennette Bihari, MD sent at 04/09/2016  8:49 PM EDT ----- Burna Mortimer, please set up for CPAP with DME

## 2016-04-19 NOTE — Progress Notes (Signed)
Left message to return a call. 

## 2016-04-19 NOTE — Telephone Encounter (Signed)
Left message to return a call to discuss sleep study. 

## 2016-04-19 NOTE — Telephone Encounter (Signed)
-----   Message from Thomas A Kelly, MD sent at 04/09/2016  8:49 PM EDT ----- Earnest Thalman, please set up for CPAP with DME 

## 2016-04-20 NOTE — Telephone Encounter (Signed)
Follow Up ° °Pt returned call//  °

## 2016-04-21 NOTE — Telephone Encounter (Signed)
Pt calling back, said he has left a couple messages and hasn't heard back. Made pt aware Austin Arnold off today and will leave message for her to call  on Monday @ [phone]

## 2016-04-24 ENCOUNTER — Other Ambulatory Visit: Payer: Self-pay | Admitting: *Deleted

## 2016-04-24 ENCOUNTER — Telehealth: Payer: Self-pay | Admitting: *Deleted

## 2016-04-24 DIAGNOSIS — G4733 Obstructive sleep apnea (adult) (pediatric): Secondary | ICD-10-CM

## 2016-04-24 DIAGNOSIS — F3132 Bipolar disorder, current episode depressed, moderate: Secondary | ICD-10-CM | POA: Diagnosis not present

## 2016-04-24 NOTE — Telephone Encounter (Signed)
Informed patient's wife of sleep study results and recommendations.

## 2016-04-24 NOTE — Telephone Encounter (Signed)
Returned a call to patient's wife informing her of patient's sleep study results and recommendations. Order sent to [facility] via [facility].

## 2016-04-25 ENCOUNTER — Encounter: Payer: Self-pay | Admitting: Cardiovascular Disease

## 2016-04-27 ENCOUNTER — Other Ambulatory Visit: Payer: Self-pay | Admitting: *Deleted

## 2016-04-27 ENCOUNTER — Encounter: Payer: Self-pay | Admitting: *Deleted

## 2016-04-27 ENCOUNTER — Telehealth: Payer: Self-pay | Admitting: *Deleted

## 2016-04-27 MED ORDER — METOPROLOL TARTRATE 25 MG PO TABS
25.0000 mg | ORAL_TABLET | Freq: Two times a day (BID) | ORAL | Status: DC
Start: 2016-04-27 — End: 2016-05-21

## 2016-04-27 NOTE — Telephone Encounter (Signed)
Returned [employer]. Informed her I would refill metoprolol - this has been taken care of.  She states pt is still having issues w pain and she instructed him to continue the prednisone at 15mg  daily until further instruction from . Advised OK. Would route to Dr. [place].  Pt was seen on 4/28 and had been advised to increase his prednisone to 10mg  BID and to update 5/28 on his condition 1 week afterwards. At that update he was instructed to resume previous dose, but he reports not really getting any improvement. does note that diabetes care compliance has been at issue. She is no longer doing home visits for this patient but will be involved and available via phone for concerns for the next month. Aware I will get recommendations from physician.

## 2016-04-27 NOTE — Telephone Encounter (Signed)
Almetta Lovely NP, left a message in regards to the patients prednisone. She stated that his pain has not improved and she suggests that the patient take 15mg  for one week. She also stated that the pharmacy had requested an rx for the patients metoprolol, but have not received a response. She can be reached at [phone] or the patient at [phone]. Thanks, MI

## 2016-04-27 NOTE — Patient Outreach (Signed)
[facility]) Care Management   04/27/2016  Gavan Nordby 25-Jul-1966 [id number]  Austin Arnold is an 50 y.o. male  Subjective:  Pt reports he has just had several diagnostic cardiology exams and he now has a new diagnosis of probable pericarditis. He was prescribed prednisone 20 mg daily for one week and has been advised to goi back to his 5 mg bid regimen for his RA sxs. He is not happy about that because he still feels the same way and thinks he should perhaps stay on the higher steroid dose.  He admits to poor diabetes diet compliance (his appetite is enormous with taking the prednisone). He is not taking Trujeo has he says he doesn't want to be on insulin. He is taking his metformin. He is also not monitoring his glucose.  He says his mood is better on the sertraline but admits to apathy about his health. He states, I am sick and tired of being sick and tired." He denies having any suicidal ideation.  Objective:   Review of Systems  Constitutional: Negative.   HENT: Negative.   Eyes: Negative.   Respiratory: Positive for shortness of breath.        SOB at rest occasionally at rest and with exertion.  Cardiovascular: Positive for chest pain.       CP occasionally not constant.  Skin: Negative.   Neurological: Positive for dizziness.       This seems to coincide with taking a deep breath.  Psychiatric/Behavioral: Positive for depression.       Pt reports the sertraline has improved his mood.    Physical Exam  Encounter Medications:   Outpatient Encounter Prescriptions as of 04/27/2016  Medication Sig  . acetaminophen (TYLENOL) 325 MG tablet Take 2 tablets (650 mg total) by mouth every 4 (four) hours as needed for fever or headache.  . Insulin Glargine (TOUJEO SOLOSTAR) 300 UNIT/ML SOPN Inject 48 Units into the skin daily.  Marland Kitchen lithium 600 MG capsule Take 600 mg by mouth daily.  . metFORMIN (GLUCOPHAGE-XR) 500 MG 24 hr tablet Take 1,000 mg by mouth 2 (two)  times daily.  . metoprolol tartrate (LOPRESSOR) 25 MG tablet Take 1 tablet (25 mg total) by mouth 2 (two) times daily.  . nitroGLYCERIN (NITROSTAT) 0.4 MG SL tablet Place 1 tablet (0.4 mg total) under the tongue every 5 (five) minutes x 3 doses as needed for chest pain.  . predniSONE (DELTASONE) 5 MG tablet Take 5 mg by mouth 2 (two) times daily with a meal.  . rosuvastatin (CRESTOR) 40 MG tablet Take 1 tablet (40 mg total) by mouth daily.  . traZODone (DESYREL) 100 MG tablet Take 100 mg by mouth daily.  Marland Kitchen ZOLOFT 50 MG tablet Take 1 tablet (50 mg total) by mouth daily.   No facility-administered encounter medications on file as of 04/27/2016.    Functional Status:   In your present state of health, do you have any difficulty performing the following activities: 02/03/2016 01/13/2016  Hearing? - N  Vision? - N  Difficulty concentrating or making decisions? - N  Walking or climbing stairs? - N  Dressing or bathing? - N  Doing errands, shopping? - N  Conservation officer, nature and eating ? N -  Using the Toilet? N -  In the past six months, have you accidently leaked urine? N -  Do you have problems with loss of bowel control? N -  Managing your Medications? N -  Managing your Finances? N -  Housekeeping or managing your Housekeeping? N -    Fall/Depression Screening:    PHQ 2/9 Scores 02/03/2016  PHQ - 2 Score 1    Assessment:  CAD, status post CABG                         Pericarditis - cont to have pain                         DM - not monitoring or taking Trujeo.  Plan:  Counseled pt for 30 mintues regarding his need to take his Trujeo insulin and be more mindful of his diabetic diet. I've also requested that he resume monitoring his glucose level. I reminded him of all the complications that uncontrolled diabetes can have on him.  Called Dr. Recardo Evangelist about possiblity of continuing increased dose of prednisone and request pt needs metoprolol refilled. Spoke to [place], Therapist, sports, who will discuss  with Dr. Recardo Evangelist.  I am not going to make any further home visits although I am very concerned about this young patient. I will call him in a month to see how he is doing. I have tried to empathize and support him all that I can.  THN CM Care Plan Problem One        Most Recent Value   Care Plan Problem One  No living will or HCPOA   Role Documenting the Problem One  Care Management [place] for Problem One  Active   THN CM Short Term Goal #1 (0-30 days)  Pt will complete his Advanced Directives this month.   THN CM Short Term Goal #1 Start Date  02/03/16   Interventions for Short Term Goal #1  Has not completed - encouragaed to do so.    THN CM Care Plan Problem Two        Most Recent Value   Care Plan Problem Two  CAD status post CAGB - 4 vessel   Role Documenting the Problem Two  Care Management Coordinator   Care Plan for Problem Two  Active   Interventions for Problem Two Long Term Goal   Reinforced need to follow his diabetic management regimen to avoid complications and hospitalizations.   THN Long Term Goal (31-90) days  Pt will not readmit in the next 90 days.   THN Long Term Goal Start Date  02/03/16   THN CM Short Term Goal #1 (0-30 days)  Pt will not readmit in the next 30 days.   THN CM Short Term Goal #1 Start Date  02/03/16   THN CM Short Term Goal #1 Met Date   02/29/16    [facility] CM Care Plan Problem Three        Most Recent Value   Care Plan Problem Three  Diabetes   Role Documenting the Problem Three  Care Management Coordinator   Care Plan for Problem Three  Active   THN Long Term Goal (31-90) days  Pt Hgb A1C will be under 7 by the end of 3 months.   THN Long Term Goal Start Date  02/03/16   Interventions for Problem Three Long Term Goal  Pt not compliant with monitoring, diet. doubtful this goal will be met considering pt apathy. I did let pt know how concerned I am about his apathy and that he is choosing to have more complications. I encouraged him  to call me and I will call  him in a month.   THN CM Short Term Goal #1 (0-30 days)  Pt to check glucose daily.   THN CM Short Term Goal #1 Start Date  02/03/16   [facility] CM Short Term Goal #1 Met Date  02/29/16   Interventions for Short Term Goal #1  Reinforced the need for pt to be aware of what his levels are and where they should be for good control.(70-120 fasting, <160 nonfasting)   THN CM Short Term Goal #2 (0-30 days)  Pt to see podiatrist ASAP for his diabetic L toe wound.   THN CM Short Term Goal #2 Start Date  02/03/16   [facility] CM Short Term Goal #2 Met Date  03/30/16   Interventions for Short Term Goal #2  Pt did do this in February and has received tx and wound is almost totally healed. Encouraged continued vigilance of his feet!     Deloria Lair [facility] [place] [phone]

## 2016-04-28 NOTE — Telephone Encounter (Signed)
Spoke to Austin Lovely, NP, regarding recommendations. She is concerned for possible pericarditis and would like further recommendations to be given to patient. I will call pt and discuss acute symptoms w/ him. Currently no schedule availability for today or early next week - will see if symptoms warrant ED eval.

## 2016-04-28 NOTE — Telephone Encounter (Signed)
If prednisone not helping, please stop it.

## 2016-04-28 NOTE — Telephone Encounter (Signed)
Spoke to patient. He voiced understanding of recommendation to stop prednisone as no improvement w/ it. Pt notes he hasn't really had substantial improvement w/ chest pain in past 2 weeks on increased prednisone dose, but also hasn't really had much change/improvement in light of all other recommendations over past 3 months. Patient notes he feels very aggressive on the prednisone. Despite this he verbalizes he is doing best he can. Expressed my sympathy and concern for his grievances. Informed him I would seek Dr. Erin Hearing advice.  Patient notes that per his last conversation/visit w/ Dr. Royann Shivers, consideration was given to re-catheterization based on possibility of a remaining occlusion. Pt fine to be seen if appropriate/something available.  He just had appt on 4/28 so would be w/in 30 day window for catheterization.  Routed to Dr. Royann Shivers for recommendations.

## 2016-05-01 DIAGNOSIS — F3132 Bipolar disorder, current episode depressed, moderate: Secondary | ICD-10-CM | POA: Diagnosis not present

## 2016-05-02 ENCOUNTER — Encounter: Payer: Self-pay | Admitting: Cardiovascular Disease

## 2016-05-08 ENCOUNTER — Ambulatory Visit: Payer: Medicare Other | Admitting: Cardiovascular Disease

## 2016-05-11 ENCOUNTER — Encounter: Payer: Self-pay | Admitting: Cardiovascular Disease

## 2016-05-11 ENCOUNTER — Encounter: Payer: Self-pay | Admitting: Cardiothoracic Surgery

## 2016-05-12 NOTE — Telephone Encounter (Signed)
Can we please schedule for cardiac cath with grafts next week, please MCR

## 2016-05-16 ENCOUNTER — Telehealth: Payer: Self-pay | Admitting: Cardiovascular Disease

## 2016-05-16 DIAGNOSIS — R5383 Other fatigue: Secondary | ICD-10-CM

## 2016-05-16 DIAGNOSIS — R0789 Other chest pain: Secondary | ICD-10-CM

## 2016-05-16 DIAGNOSIS — D689 Coagulation defect, unspecified: Secondary | ICD-10-CM

## 2016-05-16 DIAGNOSIS — Z01812 Encounter for preprocedural laboratory examination: Secondary | ICD-10-CM

## 2016-05-16 NOTE — Telephone Encounter (Signed)
Please reach out to patient regarding the recommended return heart cath.

## 2016-05-16 NOTE — Telephone Encounter (Signed)
Pt calling re speaking with Dr. Salena Saner re scheduling a heart cath and was to call if he hadn't heard from the nurse by this afternoon-pls call [phone]

## 2016-05-17 ENCOUNTER — Other Ambulatory Visit: Payer: Self-pay | Admitting: Cardiovascular Disease

## 2016-05-17 ENCOUNTER — Encounter: Payer: Self-pay | Admitting: Interventional Cardiology

## 2016-05-17 DIAGNOSIS — R0789 Other chest pain: Secondary | ICD-10-CM | POA: Diagnosis not present

## 2016-05-17 DIAGNOSIS — D689 Coagulation defect, unspecified: Secondary | ICD-10-CM | POA: Diagnosis not present

## 2016-05-17 DIAGNOSIS — R5383 Other fatigue: Secondary | ICD-10-CM | POA: Diagnosis not present

## 2016-05-17 DIAGNOSIS — Z01812 Encounter for preprocedural laboratory examination: Secondary | ICD-10-CM | POA: Diagnosis not present

## 2016-05-17 LAB — BASIC METABOLIC PANEL
BUN: 14 mg/dL (ref 7–25)
CALCIUM: 9.6 mg/dL (ref 8.6–10.3)
CO2: 26 mmol/L (ref 20–31)
Chloride: 95 mmol/L — ABNORMAL LOW (ref 98–110)
Creat: 1.1 mg/dL (ref 0.70–1.33)
Glucose, Bld: 429 mg/dL — ABNORMAL HIGH (ref 65–99)
Potassium: 4.6 mmol/L (ref 3.5–5.3)
Sodium: 134 mmol/L — ABNORMAL LOW (ref 135–146)

## 2016-05-17 LAB — CBC
HCT: 48.3 % (ref 38.5–50.0)
Hemoglobin: 15.9 g/dL (ref 13.2–17.1)
MCH: 28.7 pg (ref 27.0–33.0)
MCHC: 32.9 g/dL (ref 32.0–36.0)
MCV: 87.2 fL (ref 80.0–100.0)
MPV: 11.4 fL (ref 7.5–12.5)
Platelets: 150 K/uL (ref 140–400)
RBC: 5.54 MIL/uL (ref 4.20–5.80)
RDW: 13.8 % (ref 11.0–15.0)
WBC: 7.6 K/uL (ref 3.8–10.8)

## 2016-05-17 LAB — PROTIME-INR
INR: 0.9
Prothrombin Time: 12.2 s (ref 11.6–15.2)

## 2016-05-17 LAB — TSH: TSH: 1.12 m[IU]/L (ref 0.40–4.50)

## 2016-05-17 LAB — APTT: aPTT: 29 s (ref 24–37)

## 2016-05-17 NOTE — Telephone Encounter (Signed)
Patient is scheduled to have procedure on 05/19/16 at 10:30a with Dr Eldridge Dace. Called patient with date, time, instructions.

## 2016-05-17 NOTE — Telephone Encounter (Signed)
Thanks

## 2016-05-17 NOTE — Telephone Encounter (Signed)
Cath scheduled for 05/19/16 at 10:30a with Dr Eldridge Dace.

## 2016-05-17 NOTE — Telephone Encounter (Signed)
Chelley, do we have a time and date and Cardiologist yet?

## 2016-05-18 ENCOUNTER — Telehealth: Payer: Self-pay | Admitting: Cardiovascular Disease

## 2016-05-18 NOTE — Telephone Encounter (Signed)
Returned call to pt's wife, ok per DPR.  Gave her education on monitoring for signs of hyperglycemia. She verbalized understanding. She said sometimes his blood sugar gets up this high when he eats poorly and gets stressed. She will communicate this information to him.

## 2016-05-18 NOTE — Telephone Encounter (Signed)
New message     The lab tech needs to speak with a nurse will supply the information once someone has called backed

## 2016-05-18 NOTE — Telephone Encounter (Signed)
Returned call to [facility] lab with notification that pt's blood glucose was 429 from lab draw yesterday, 05/17/16.  Will call pt to investigate.

## 2016-05-18 NOTE — Telephone Encounter (Signed)
Attempted to call pt. He was not home but his wife answered, ok per DPR. Spoke with her to see if she knew if pt had been taking his diabetic medications. She said he has been "cheating" on his diet and has recently eaten desserts. I asked if there was another number I could call to reach the pt. She said there was not.  Took this information and glucose lab value of 429 to Dr Antoine Poche, DOD. No further orders given at this time. Pt for heart cath tomorrow at the hospital.

## 2016-05-19 ENCOUNTER — Encounter (HOSPITAL_COMMUNITY): Payer: Self-pay | Admitting: General Practice

## 2016-05-19 ENCOUNTER — Encounter (HOSPITAL_COMMUNITY)
Admission: RE | Disposition: A | Discharge status: Home / Self Care | Payer: Self-pay | Source: Ambulatory Visit | Attending: Interventional Cardiology

## 2016-05-19 ENCOUNTER — Ambulatory Visit (HOSPITAL_COMMUNITY)
Admission: RE | Admit: 2016-05-19 | Discharge: 2016-05-21 | Disposition: A | Discharge status: Home / Self Care | Payer: Medicare Other | Source: Ambulatory Visit | Attending: Interventional Cardiology | Admitting: Interventional Cardiology

## 2016-05-19 DIAGNOSIS — Z794 Long term (current) use of insulin: Secondary | ICD-10-CM | POA: Insufficient documentation

## 2016-05-19 DIAGNOSIS — Z951 Presence of aortocoronary bypass graft: Secondary | ICD-10-CM | POA: Diagnosis not present

## 2016-05-19 DIAGNOSIS — IMO0002 Reserved for concepts with insufficient information to code with codable children: Secondary | ICD-10-CM | POA: Diagnosis present

## 2016-05-19 DIAGNOSIS — E118 Type 2 diabetes mellitus with unspecified complications: Secondary | ICD-10-CM

## 2016-05-19 DIAGNOSIS — I25810 Atherosclerosis of coronary artery bypass graft(s) without angina pectoris: Secondary | ICD-10-CM | POA: Diagnosis present

## 2016-05-19 DIAGNOSIS — G4733 Obstructive sleep apnea (adult) (pediatric): Secondary | ICD-10-CM | POA: Insufficient documentation

## 2016-05-19 DIAGNOSIS — E785 Hyperlipidemia, unspecified: Secondary | ICD-10-CM | POA: Diagnosis not present

## 2016-05-19 DIAGNOSIS — I2582 Chronic total occlusion of coronary artery: Secondary | ICD-10-CM | POA: Diagnosis not present

## 2016-05-19 DIAGNOSIS — I25110 Atherosclerotic heart disease of native coronary artery with unstable angina pectoris: Secondary | ICD-10-CM | POA: Diagnosis not present

## 2016-05-19 DIAGNOSIS — I25710 Atherosclerosis of autologous vein coronary artery bypass graft(s) with unstable angina pectoris: Secondary | ICD-10-CM | POA: Insufficient documentation

## 2016-05-19 DIAGNOSIS — I252 Old myocardial infarction: Secondary | ICD-10-CM | POA: Diagnosis not present

## 2016-05-19 DIAGNOSIS — I200 Unstable angina: Secondary | ICD-10-CM | POA: Diagnosis present

## 2016-05-19 DIAGNOSIS — Z7952 Long term (current) use of systemic steroids: Secondary | ICD-10-CM | POA: Insufficient documentation

## 2016-05-19 DIAGNOSIS — F319 Bipolar disorder, unspecified: Secondary | ICD-10-CM | POA: Diagnosis not present

## 2016-05-19 DIAGNOSIS — E1165 Type 2 diabetes mellitus with hyperglycemia: Secondary | ICD-10-CM

## 2016-05-19 DIAGNOSIS — Z955 Presence of coronary angioplasty implant and graft: Secondary | ICD-10-CM

## 2016-05-19 DIAGNOSIS — Z7982 Long term (current) use of aspirin: Secondary | ICD-10-CM | POA: Insufficient documentation

## 2016-05-19 DIAGNOSIS — Z87891 Personal history of nicotine dependence: Secondary | ICD-10-CM | POA: Insufficient documentation

## 2016-05-19 DIAGNOSIS — D696 Thrombocytopenia, unspecified: Secondary | ICD-10-CM | POA: Insufficient documentation

## 2016-05-19 DIAGNOSIS — M069 Rheumatoid arthritis, unspecified: Secondary | ICD-10-CM

## 2016-05-19 DIAGNOSIS — R0789 Other chest pain: Secondary | ICD-10-CM

## 2016-05-19 HISTORY — DX: Major depressive disorder, single episode, unspecified: F32.9

## 2016-05-19 HISTORY — DX: Atherosclerotic heart disease of native coronary artery without angina pectoris: I25.10

## 2016-05-19 HISTORY — DX: Type 2 diabetes mellitus without complications: E11.9

## 2016-05-19 HISTORY — DX: Obstructive sleep apnea (adult) (pediatric): G47.33

## 2016-05-19 HISTORY — DX: Rheumatoid arthritis, unspecified: M06.9

## 2016-05-19 HISTORY — DX: Personal history of other diseases of the musculoskeletal system and connective tissue: Z87.39

## 2016-05-19 HISTORY — DX: Depression, unspecified: F32.A

## 2016-05-19 HISTORY — PX: CARDIAC CATHETERIZATION: SHX172

## 2016-05-19 LAB — CBC
HEMATOCRIT: 43.2 % (ref 39.0–52.0)
HEMOGLOBIN: 13.9 g/dL (ref 13.0–17.0)
MCH: 27.4 pg (ref 26.0–34.0)
MCHC: 32.2 g/dL (ref 30.0–36.0)
MCV: 85 fL (ref 78.0–100.0)
Platelets: 125 10*3/uL — ABNORMAL LOW (ref 150–400)
RBC: 5.08 MIL/uL (ref 4.22–5.81)
RDW: 13.3 % (ref 11.5–15.5)
WBC: 6.8 10*3/uL (ref 4.0–10.5)

## 2016-05-19 LAB — CREATININE, SERUM
Creatinine, Ser: 0.89 mg/dL (ref 0.61–1.24)
GFR calc Af Amer: >60 mL/min (ref >60)

## 2016-05-19 LAB — POCT ACTIVATED CLOTTING TIME: ACTIVATED CLOTTING TIME: 471 s

## 2016-05-19 LAB — GLUCOSE, CAPILLARY
GLUCOSE-CAPILLARY: 357 mg/dL — AB (ref 65–99)
Glucose-Capillary: 284 mg/dL — ABNORMAL HIGH (ref 65–99)
Glucose-Capillary: 215 mg/dL — ABNORMAL HIGH (ref 65–99)

## 2016-05-19 SURGERY — LEFT HEART CATH AND CORS/GRAFTS ANGIOGRAPHY
Anesthesia: LOCAL

## 2016-05-19 MED ORDER — VERAPAMIL HCL 2.5 MG/ML IV SOLN
INTRAVENOUS | Status: AC
  Filled 2016-05-19: qty 2

## 2016-05-19 MED ORDER — MIDAZOLAM HCL 2 MG/2ML IJ SOLN
INTRAMUSCULAR | Status: DC | PRN
  Administered 2016-05-19: 1 mg via INTRAVENOUS
  Administered 2016-05-19: 2 mg via INTRAVENOUS
  Administered 2016-05-19 (×3): 1 mg via INTRAVENOUS

## 2016-05-19 MED ORDER — SERTRALINE HCL 50 MG PO TABS
50.0000 mg | ORAL_TABLET | Freq: Every day | ORAL | Status: DC
  Administered 2016-05-19 – 2016-05-21 (×3): 50 mg via ORAL
  Filled 2016-05-19 (×3): qty 1

## 2016-05-19 MED ORDER — SODIUM CHLORIDE 0.9 % WEIGHT BASED INFUSION: 3.0000 mL/kg/h | INTRAVENOUS | Status: AC

## 2016-05-19 MED ORDER — FENTANYL CITRATE (PF) 100 MCG/2ML IJ SOLN
INTRAMUSCULAR | Status: DC | PRN
  Administered 2016-05-19: 50 ug via INTRAVENOUS
  Administered 2016-05-19 (×3): 25 ug via INTRAVENOUS
  Administered 2016-05-19: 50 ug via INTRAVENOUS

## 2016-05-19 MED ORDER — IOPAMIDOL (ISOVUE-370) INJECTION 76%
INTRAVENOUS | Status: AC
  Filled 2016-05-19: qty 100

## 2016-05-19 MED ORDER — NITROGLYCERIN 0.4 MG SL SUBL: 0.4000 mg | SUBLINGUAL_TABLET | SUBLINGUAL | Status: DC | PRN

## 2016-05-19 MED ORDER — LIDOCAINE HCL (PF) 1 % IJ SOLN
INTRAMUSCULAR | Status: AC
  Filled 2016-05-19: qty 30

## 2016-05-19 MED ORDER — IOPAMIDOL (ISOVUE-370) INJECTION 76%
INTRAVENOUS | Status: AC
  Filled 2016-05-19: qty 50

## 2016-05-19 MED ORDER — INSULIN GLARGINE 100 UNIT/ML ~~LOC~~ SOLN
48.0000 [IU] | Freq: Every day | SUBCUTANEOUS | Status: DC
  Administered 2016-05-19 – 2016-05-21 (×3): 48 [IU] via SUBCUTANEOUS
  Filled 2016-05-19 (×3): qty 0.48

## 2016-05-19 MED ORDER — SODIUM CHLORIDE 0.9% FLUSH: 3.0000 mL | INTRAVENOUS | Status: DC | PRN

## 2016-05-19 MED ORDER — ONDANSETRON HCL 4 MG/2ML IJ SOLN: 4.0000 mg | Freq: Four times a day (QID) | INTRAMUSCULAR | Status: DC | PRN

## 2016-05-19 MED ORDER — ASPIRIN 81 MG PO CHEW
81.0000 mg | CHEWABLE_TABLET | Freq: Every day | ORAL | Status: DC
  Administered 2016-05-19 – 2016-05-21 (×3): 81 mg via ORAL
  Filled 2016-05-19 (×3): qty 1

## 2016-05-19 MED ORDER — SODIUM CHLORIDE 0.9 % WEIGHT BASED INFUSION
1.0000 mL/kg/h | INTRAVENOUS | Status: DC
  Administered 2016-05-19: 2.345 mL/kg/h via INTRAVENOUS

## 2016-05-19 MED ORDER — NITROGLYCERIN 1 MG/10 ML FOR IR/CATH LAB
INTRA_ARTERIAL | Status: AC
  Filled 2016-05-19: qty 10

## 2016-05-19 MED ORDER — FENTANYL CITRATE (PF) 100 MCG/2ML IJ SOLN
INTRAMUSCULAR | Status: AC
  Filled 2016-05-19: qty 2

## 2016-05-19 MED ORDER — SODIUM CHLORIDE 0.9 % IV SOLN
250.0000 mg | INTRAVENOUS | Status: DC | PRN
  Administered 2016-05-19: 1.75 mg/kg/h via INTRAVENOUS
  Administered 2016-05-19: 250 mg

## 2016-05-19 MED ORDER — ASPIRIN 81 MG PO CHEW: 81.0000 mg | CHEWABLE_TABLET | ORAL | Status: DC

## 2016-05-19 MED ORDER — VERAPAMIL HCL 2.5 MG/ML IV SOLN
INTRAVENOUS | Status: DC | PRN
  Administered 2016-05-19: 10 mL via INTRA_ARTERIAL

## 2016-05-19 MED ORDER — IOPAMIDOL (ISOVUE-370) INJECTION 76%
INTRAVENOUS | Status: DC | PRN
  Administered 2016-05-19: 330 mL via INTRAVENOUS

## 2016-05-19 MED ORDER — BIVALIRUDIN BOLUS VIA INFUSION - CUPID
INTRAVENOUS | Status: DC | PRN
  Administered 2016-05-19: 78.225 mg via INTRAVENOUS

## 2016-05-19 MED ORDER — ASPIRIN EC 81 MG PO TBEC: 81.0000 mg | DELAYED_RELEASE_TABLET | Freq: Every day | ORAL | Status: DC

## 2016-05-19 MED ORDER — HEPARIN SODIUM (PORCINE) 5000 UNIT/ML IJ SOLN
5000.0000 [IU] | Freq: Three times a day (TID) | INTRAMUSCULAR | Status: DC
  Administered 2016-05-19 – 2016-05-21 (×5): 5000 [IU] via SUBCUTANEOUS
  Filled 2016-05-19 (×5): qty 1

## 2016-05-19 MED ORDER — ROSUVASTATIN CALCIUM 40 MG PO TABS
40.0000 mg | ORAL_TABLET | Freq: Every day | ORAL | Status: DC
  Administered 2016-05-19 – 2016-05-21 (×3): 40 mg via ORAL
  Filled 2016-05-19: qty 1
  Filled 2016-05-19 (×2): qty 2
  Filled 2016-05-19 (×2): qty 1

## 2016-05-19 MED ORDER — MIDAZOLAM HCL 2 MG/2ML IJ SOLN
INTRAMUSCULAR | Status: AC
  Filled 2016-05-19: qty 2

## 2016-05-19 MED ORDER — TICAGRELOR 90 MG PO TABS
ORAL_TABLET | ORAL | Status: DC | PRN
  Administered 2016-05-19: 180 mg via ORAL

## 2016-05-19 MED ORDER — NITROGLYCERIN 1 MG/10 ML FOR IR/CATH LAB
INTRA_ARTERIAL | Status: DC | PRN
  Administered 2016-05-19 (×2): 200 ug via INTRACORONARY

## 2016-05-19 MED ORDER — LITHIUM CARBONATE 300 MG PO CAPS
600.0000 mg | ORAL_CAPSULE | Freq: Every day | ORAL | Status: DC
Start: 2016-05-20 — End: 2016-05-21
  Administered 2016-05-20 – 2016-05-21 (×2): 600 mg via ORAL
  Filled 2016-05-19 (×2): qty 2

## 2016-05-19 MED ORDER — TRAZODONE HCL 100 MG PO TABS
100.0000 mg | ORAL_TABLET | Freq: Every day | ORAL | Status: DC
  Administered 2016-05-19: 100 mg via ORAL
  Filled 2016-05-19 (×2): qty 1

## 2016-05-19 MED ORDER — INSULIN ASPART 100 UNIT/ML ~~LOC~~ SOLN
0.0000 [IU] | Freq: Three times a day (TID) | SUBCUTANEOUS | Status: DC
  Administered 2016-05-20: 15 [IU] via SUBCUTANEOUS

## 2016-05-19 MED ORDER — HEPARIN SODIUM (PORCINE) 1000 UNIT/ML IJ SOLN
INTRAMUSCULAR | Status: DC | PRN
  Administered 2016-05-19: 5000 [IU] via INTRAVENOUS

## 2016-05-19 MED ORDER — SODIUM CHLORIDE 0.9% FLUSH
3.0000 mL | Freq: Two times a day (BID) | INTRAVENOUS | Status: DC
Start: 2016-05-19 — End: 2016-05-21
  Administered 2016-05-19 – 2016-05-21 (×4): 3 mL via INTRAVENOUS

## 2016-05-19 MED ORDER — ACETAMINOPHEN 325 MG PO TABS
650.0000 mg | ORAL_TABLET | ORAL | Status: DC | PRN
  Administered 2016-05-19: 22:00:00 650 mg via ORAL
  Filled 2016-05-19: qty 2

## 2016-05-19 MED ORDER — BIVALIRUDIN 250 MG IV SOLR
INTRAVENOUS | Status: AC
  Filled 2016-05-19: qty 250

## 2016-05-19 MED ORDER — TICAGRELOR 90 MG PO TABS
ORAL_TABLET | ORAL | Status: AC
  Filled 2016-05-19: qty 2

## 2016-05-19 MED ORDER — INSULIN GLARGINE 300 UNIT/ML ~~LOC~~ SOPN: 48.0000 [IU] | PEN_INJECTOR | Freq: Every day | SUBCUTANEOUS | Status: DC

## 2016-05-19 MED ORDER — TICAGRELOR 90 MG PO TABS
90.0000 mg | ORAL_TABLET | Freq: Two times a day (BID) | ORAL | Status: DC
  Administered 2016-05-20 – 2016-05-21 (×4): 90 mg via ORAL
  Filled 2016-05-19 (×4): qty 1

## 2016-05-19 MED ORDER — SODIUM CHLORIDE 0.9 % IV SOLN: 250.0000 mL | INTRAVENOUS | Status: DC | PRN

## 2016-05-19 MED ORDER — HEPARIN (PORCINE) IN NACL 2-0.9 UNIT/ML-% IJ SOLN
INTRAMUSCULAR | Status: DC | PRN
  Administered 2016-05-19: 14:00:00

## 2016-05-19 MED ORDER — SODIUM CHLORIDE 0.9 % WEIGHT BASED INFUSION
3.0000 mL/kg/h | INTRAVENOUS | Status: DC
  Administered 2016-05-19: 3 mL/kg/h via INTRAVENOUS

## 2016-05-19 MED ORDER — LIDOCAINE HCL (PF) 1 % IJ SOLN
INTRAMUSCULAR | Status: DC | PRN
  Administered 2016-05-19: 15 mL
  Administered 2016-05-19: 4 mL

## 2016-05-19 MED ORDER — HEPARIN (PORCINE) IN NACL 2-0.9 UNIT/ML-% IJ SOLN
INTRAMUSCULAR | Status: AC
  Filled 2016-05-19: qty 1000

## 2016-05-19 MED ORDER — PREDNISONE 5 MG PO TABS
5.0000 mg | ORAL_TABLET | Freq: Two times a day (BID) | ORAL | Status: DC
  Administered 2016-05-19 – 2016-05-21 (×4): 5 mg via ORAL
  Filled 2016-05-19 (×4): qty 1

## 2016-05-19 SURGICAL SUPPLY — 40 items
BALLN EMERGE MR 3.0X12 (BALLOONS) ×2
BALLN EUPHORA RX 2.0X20 (BALLOONS) ×2
BALLN EUPHORA RX 2.5X20 (BALLOONS) ×2
BALLN MAVERICK OTW 3.0X15 (BALLOONS) ×2
BALLN ~~LOC~~ EMERGE MR 3.0X20 (BALLOONS) ×2
BALLN ~~LOC~~ EMERGE MR 3.75X8 (BALLOONS) ×2
BALLN ~~LOC~~ EUPHORA RX 3.0X15 (BALLOONS) ×2
BALLOON EMERGE MR 3.0X12 (BALLOONS) ×1 IMPLANT
BALLOON EUPHORA RX 2.0X20 (BALLOONS) ×1 IMPLANT
BALLOON EUPHORA RX 2.5X20 (BALLOONS) ×1 IMPLANT
BALLOON MAVERICK OTW 3.0X15 (BALLOONS) ×1 IMPLANT
BALLOON ~~LOC~~ EMERGE MR 3.0X20 (BALLOONS) ×1 IMPLANT
BALLOON ~~LOC~~ EMERGE MR 3.75X8 (BALLOONS) ×1 IMPLANT
BALLOON ~~LOC~~ EUPHORA RX 3.0X15 (BALLOONS) ×1 IMPLANT
CATH INFINITI 5 FR AL2 (CATHETERS) ×2 IMPLANT
CATH INFINITI 5 FR LCB (CATHETERS) ×2 IMPLANT
CATH INFINITI 5FR AL1 (CATHETERS) ×2 IMPLANT
CATH INFINITI 5FR ANG PIGTAIL (CATHETERS) ×2 IMPLANT
CATH INFINITI 5FR MULTPACK ANG (CATHETERS) ×2 IMPLANT
CATH MACH 1 7FR CLS3.5 (CATHETERS) ×2 IMPLANT
COVER PRB 48X5XTLSCP FOLD TPE (BAG) ×1 IMPLANT
COVER PROBE 5X48 (BAG) ×1
DEVICE RAD COMP TR BAND LRG (VASCULAR PRODUCTS) ×2 IMPLANT
GLIDESHEATH SLEND SS 6F .021 (SHEATH) ×2 IMPLANT
GUIDE CATH RUNWAY 6FR CLS3.5 (CATHETERS) ×2 IMPLANT
KIT ENCORE 26 ADVANTAGE (KITS) ×4 IMPLANT
KIT HEART LEFT (KITS) ×2 IMPLANT
PACK CARDIAC CATHETERIZATION (CUSTOM PROCEDURE TRAY) ×2 IMPLANT
SHEATH PINNACLE 7F 10CM (SHEATH) ×2 IMPLANT
STENT SYNERGY DES 2.75X38 (Permanent Stent) ×2 IMPLANT
STENT SYNERGY DES 3X16 (Permanent Stent) ×2 IMPLANT
STENT SYNERGY DES 3X24 (Permanent Stent) ×2 IMPLANT
SYR MEDRAD MARK V 150ML (SYRINGE) ×2 IMPLANT
TRANSDUCER W/STOPCOCK (MISCELLANEOUS) ×2 IMPLANT
TUBING CIL FLEX 10 FLL-RA (TUBING) ×2 IMPLANT
VALVE GUARDIAN II ~~LOC~~ HEMO (MISCELLANEOUS) ×2 IMPLANT
WIRE ASAHI PROWATER 180CM (WIRE) ×4 IMPLANT
WIRE GUIDE ASAHI EXTENSION 165 (WIRE) ×2 IMPLANT
WIRE HI TORQ BMW 190CM (WIRE) ×4 IMPLANT
WIRE SAFE-T 1.5MM-J .035X260CM (WIRE) ×2 IMPLANT

## 2016-05-19 NOTE — Progress Notes (Signed)
Site area: RFA Site Prior to Removal:  Level 0 Pressure Applied For:19min Manual:  yes  Patient Status During Pull:stable   Post Pull Site:  Level 0 Post Pull Instructions Given: yes  Post Pull Pulses Present: palpable Dressing Applied:  tegaderm Bedrest begins @1800  till 2200  Comments:

## 2016-05-19 NOTE — Research (Signed)
Twilight Informed Consent   Subject Name: Austin Arnold  Subject met inclusion and exclusion criteria.  The informed consent form, study requirements and expectations were reviewed with the subject and questions and concerns were addressed prior to the signing of the consent form.  The subject verbalized understanding of the trail requirements.  The subject agreed to participate in the Twilight trial and signed the informed consent.  The informed consent was obtained prior to performance of any protocol-specific procedures for the subject.  A copy of the signed informed consent was given to the subject and a copy was placed in the subject's medical record.  Sandie Ano 05/19/2016, 16:50

## 2016-05-19 NOTE — H&P (Signed)
Austin Arnold is an 50 y.o. male.   Primary Cardiologist: Dr. Salena Saner PMD: Chief Complaint: chest pain HPI: 50 y/o with CAD, s/p CABG with persistent chest pain.  Past Medical History  Diagnosis Date  . Drug abuse   . Irregular heart beat   . Bipolar 1 disorder (HCC)   . Sleep apnea     DOES NOT USE CPAP  . Diabetes mellitus without complication (HCC)     INSULIN DEPENDENT  . GERD (gastroesophageal reflux disease)     Past Surgical History  Procedure Laterality Date  . Leg surgery    . Neck surgery    . Nasal sinus surgery    . Wrist surgery    . Cholecystectomy    . Cardiac catheterization N/A 01/13/2016    Procedure: Left Heart Cath and Coronary Angiography;  Surgeon: Lyn Records, MD;  Location: [facility] INVASIVE CV LAB;  Service: Cardiovascular;  Laterality: N/A;  . Coronary artery bypass graft N/A 01/20/2016    Procedure: CORONARY ARTERY BYPASS GRAFTING (CABG) times four using left internal mammary artery and right saphenous vein;  Surgeon: Kerin Perna, MD;  Location: [facility] OR;  Service: Open Heart Surgery;  Laterality: N/A;  . Tee without cardioversion N/A 01/20/2016    Procedure: TRANSESOPHAGEAL ECHOCARDIOGRAM (TEE);  Surgeon: Kerin Perna, MD;  Location: [facility] OR;  Service: Open Heart Surgery;  Laterality: N/A;    No family history on file. Social History:  reports that he has quit smoking. His smoking use included Cigars. He has never used smokeless tobacco. He reports that he uses illicit drugs (Cocaine). He reports that he does not drink alcohol.  Allergies:  Allergies  Allergen Reactions  . Sulfa Antibiotics Other (See Comments)    Reaction unknown occurred during childhood    Medications Prior to Admission  Medication Sig Dispense Refill  . aspirin 81 MG tablet Take 81 mg by mouth daily.    Marland Kitchen lithium 600 MG capsule Take 600 mg by mouth daily.    . metFORMIN (GLUCOPHAGE-XR) 500 MG 24 hr tablet Take 1,000 mg by mouth 2 (two) times daily.    . nitroGLYCERIN (NITROSTAT)  0.4 MG SL tablet Place 1 tablet (0.4 mg total) under the tongue every 5 (five) minutes x 3 doses as needed for chest pain. 25 tablet 3  . predniSONE (DELTASONE) 10 MG tablet Take 5 mg by mouth 2 (two) times daily.    . rosuvastatin (CRESTOR) 40 MG tablet Take 1 tablet (40 mg total) by mouth daily. 90 tablet 3  . traZODone (DESYREL) 100 MG tablet Take 100 mg by mouth daily.    Marland Kitchen ZOLOFT 50 MG tablet Take 1 tablet (50 mg total) by mouth daily. 90 tablet 3  . Insulin Glargine (TOUJEO SOLOSTAR) 300 UNIT/ML SOPN Inject 48 Units into the skin daily. Reported on 04/27/2016    . metoprolol tartrate (LOPRESSOR) 25 MG tablet Take 1 tablet (25 mg total) by mouth 2 (two) times daily. (Patient not taking: Reported on 05/17/2016) 60 tablet 5    Results for orders placed or performed during the hospital encounter of 05/19/16 (from the past 48 hour(s))  Glucose, capillary     Status: Abnormal   Collection Time: 05/19/16  9:25 AM  Result Value Ref Range   Glucose-Capillary 284 (H) 65 - 99 mg/dL   No results found.  ROS: chest pain  OBJECTIVE:   Vitals:   Filed Vitals:   05/19/16 0838 05/19/16 1202  BP: 144/80   Pulse: 63  Temp: 98.3 F (36.8 C)   TempSrc: Oral   Resp: 18   Height: 6\' 3"  (1.905 m)   Weight: 230 lb (104.327 kg)   SpO2: 99% 95%   I&O's:  No intake or output data in the 24 hours ending 05/19/16 1213 TELEMETRY: Reviewed telemetry pt in NSR:     PHYSICAL EXAM General: Well developed, well nourished, in no acute distress Head:   Normal cephalic and atramatic  Lungs:   Clear bilaterally to auscultation. Heart:   HRRR S1 S2  No JVD.   Abdomen: abdomen soft and non-tender Msk:  Back normal,  Normal strength and tone for age. Extremities:   No edema.   Neuro: Alert and oriented. Psych:  Normal affect, responds appropriately  LABS: Basic Metabolic Panel:  Recent Labs  07/19/16 1203  NA 134*  K 4.6  CL 95*  CO2 26  GLUCOSE 429*  BUN 14  CREATININE 1.10  CALCIUM 9.6    Liver Function Tests: No results for input(s): AST, ALT, ALKPHOS, BILITOT, PROT, ALBUMIN in the last 72 hours. No results for input(s): LIPASE, AMYLASE in the last 72 hours. CBC:  Recent Labs  05/17/16 1203  WBC 7.6  HGB 15.9  HCT 48.3  MCV 87.2  PLT 150   Cardiac Enzymes: No results for input(s): CKTOTAL, CKMB, CKMBINDEX, TROPONINI in the last 72 hours. BNP: Invalid input(s): POCBNP D-Dimer: No results for input(s): DDIMER in the last 72 hours. Hemoglobin A1C: No results for input(s): HGBA1C in the last 72 hours. Fasting Lipid Panel: No results for input(s): CHOL, HDL, LDLCALC, TRIG, CHOLHDL, LDLDIRECT in the last 72 hours. Thyroid Function Tests:  Recent Labs  05/17/16 1203  TSH 1.12   Anemia Panel: No results for input(s): VITAMINB12, FOLATE, FERRITIN, TIBC, IRON, RETICCTPCT in the last 72 hours. Coag Panel:   Lab Results  Component Value Date   INR 0.90 05/17/2016   INR 1.34 01/20/2016   INR 1.05 01/20/2016       Assessment/Plan CAD: refractory chest pain.  Plan for cath.Cath Lab Visit (complete for each Cath Lab visit)  Clinical Evaluation Leading to the Procedure:   ACS: Yes.    Non-ACS:    Anginal Classification: CCS III  Anti-ischemic medical therapy: Minimal Therapy (1 class of medications)  Non-Invasive Test Results: Low-risk stress test findings: cardiac mortality <1%/year  Prior CABG: Previous CABG        [facility] 05/19/2016, 12:13 PM

## 2016-05-20 ENCOUNTER — Encounter (HOSPITAL_COMMUNITY): Payer: Self-pay | Admitting: Family Medicine

## 2016-05-20 DIAGNOSIS — E1165 Type 2 diabetes mellitus with hyperglycemia: Secondary | ICD-10-CM | POA: Diagnosis not present

## 2016-05-20 DIAGNOSIS — I25710 Atherosclerosis of autologous vein coronary artery bypass graft(s) with unstable angina pectoris: Secondary | ICD-10-CM | POA: Diagnosis not present

## 2016-05-20 DIAGNOSIS — M069 Rheumatoid arthritis, unspecified: Secondary | ICD-10-CM

## 2016-05-20 DIAGNOSIS — E785 Hyperlipidemia, unspecified: Secondary | ICD-10-CM | POA: Diagnosis not present

## 2016-05-20 DIAGNOSIS — I25110 Atherosclerotic heart disease of native coronary artery with unstable angina pectoris: Secondary | ICD-10-CM | POA: Diagnosis not present

## 2016-05-20 DIAGNOSIS — E118 Type 2 diabetes mellitus with unspecified complications: Secondary | ICD-10-CM | POA: Diagnosis not present

## 2016-05-20 DIAGNOSIS — F319 Bipolar disorder, unspecified: Secondary | ICD-10-CM | POA: Diagnosis not present

## 2016-05-20 DIAGNOSIS — I2582 Chronic total occlusion of coronary artery: Secondary | ICD-10-CM | POA: Diagnosis not present

## 2016-05-20 DIAGNOSIS — I200 Unstable angina: Secondary | ICD-10-CM

## 2016-05-20 DIAGNOSIS — Z794 Long term (current) use of insulin: Secondary | ICD-10-CM

## 2016-05-20 DIAGNOSIS — Z955 Presence of coronary angioplasty implant and graft: Secondary | ICD-10-CM | POA: Insufficient documentation

## 2016-05-20 DIAGNOSIS — I2510 Atherosclerotic heart disease of native coronary artery without angina pectoris: Secondary | ICD-10-CM | POA: Diagnosis not present

## 2016-05-20 LAB — CBC
HCT: 43.4 % (ref 39.0–52.0)
Hemoglobin: 13.7 g/dL (ref 13.0–17.0)
MCH: 27.2 pg (ref 26.0–34.0)
MCHC: 31.6 g/dL (ref 30.0–36.0)
MCV: 86.1 fL (ref 78.0–100.0)
PLATELETS: 134 10*3/uL — AB (ref 150–400)
RBC: 5.04 MIL/uL (ref 4.22–5.81)
RDW: 13.5 % (ref 11.5–15.5)
WBC: 6.9 10*3/uL (ref 4.0–10.5)

## 2016-05-20 LAB — BASIC METABOLIC PANEL
Anion gap: 6 (ref 5–15)
BUN: 15 mg/dL (ref 6–20)
CHLORIDE: 101 mmol/L (ref 101–111)
CO2: 27 mmol/L (ref 22–32)
CREATININE: 1.01 mg/dL (ref 0.61–1.24)
Calcium: 9.3 mg/dL (ref 8.9–10.3)
GFR calc Af Amer: >60 mL/min (ref >60)
GLUCOSE: 389 mg/dL — AB (ref 65–99)
Potassium: 4.7 mmol/L (ref 3.5–5.1)
SODIUM: 134 mmol/L — AB (ref 135–145)

## 2016-05-20 LAB — GLUCOSE, CAPILLARY
GLUCOSE-CAPILLARY: 238 mg/dL — AB (ref 65–99)
GLUCOSE-CAPILLARY: 171 mg/dL — AB (ref 65–99)
Glucose-Capillary: 428 mg/dL — ABNORMAL HIGH (ref 65–99)
Glucose-Capillary: 253 mg/dL — ABNORMAL HIGH (ref 65–99)

## 2016-05-20 MED ORDER — INSULIN ASPART 100 UNIT/ML ~~LOC~~ SOLN: 0.0000 [IU] | Freq: Every day | SUBCUTANEOUS | Status: DC

## 2016-05-20 MED ORDER — TRAZODONE HCL 100 MG PO TABS
100.0000 mg | ORAL_TABLET | Freq: Every day | ORAL | Status: DC
  Administered 2016-05-20: 100 mg via ORAL
  Filled 2016-05-20: qty 1

## 2016-05-20 MED ORDER — METOPROLOL TARTRATE 25 MG PO TABS
25.0000 mg | ORAL_TABLET | Freq: Two times a day (BID) | ORAL | Status: DC
Start: 2016-05-20 — End: 2016-05-21
  Administered 2016-05-20 – 2016-05-21 (×3): 25 mg via ORAL
  Filled 2016-05-20 (×3): qty 1

## 2016-05-20 MED ORDER — INSULIN ASPART 100 UNIT/ML ~~LOC~~ SOLN
15.0000 [IU] | Freq: Once | SUBCUTANEOUS | Status: AC
  Administered 2016-05-20: 15 [IU] via SUBCUTANEOUS

## 2016-05-20 MED ORDER — ANGIOPLASTY BOOK
Freq: Once | Status: AC
  Administered 2016-05-20: 07:00:00
  Filled 2016-05-20: qty 1

## 2016-05-20 MED ORDER — INSULIN ASPART 100 UNIT/ML ~~LOC~~ SOLN
0.0000 [IU] | Freq: Three times a day (TID) | SUBCUTANEOUS | Status: DC
  Administered 2016-05-20: 12:00:00 11 [IU] via SUBCUTANEOUS
  Administered 2016-05-20: 7 [IU] via SUBCUTANEOUS
  Administered 2016-05-21: 4 [IU] via SUBCUTANEOUS

## 2016-05-20 MED ORDER — ALPRAZOLAM 0.5 MG PO TABS
0.5000 mg | ORAL_TABLET | Freq: Three times a day (TID) | ORAL | Status: DC | PRN
  Administered 2016-05-20: 0.5 mg via ORAL
  Filled 2016-05-20: qty 1
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
## 2016-05-20 NOTE — Progress Notes (Signed)
PHASE I CARDIAC REHAB  Pt refused ambulation.  Pt agitated about prolonged hospital stay to regulate blood sugar. Pt very argumentative and not accepting of education.  Handouts given.  Pt oriented to outpatient cardiac rehab. Pt states he is not interested due to high insurance copayment.

## 2016-05-20 NOTE — Progress Notes (Signed)
SUBJECTIVE:  No complaints  OBJECTIVE:   Vitals:   Filed Vitals:   05/19/16 2000 05/19/16 2100 05/20/16 0259 05/20/16 0745  BP: 175/82 152/85 114/57 118/73  Pulse:   91 88  Temp:   97.5 F (36.4 C) 98.2 F (36.8 C)  TempSrc:   Oral Oral  Resp: 21 23 13 22   Height:      Weight:   239 lb 6.7 oz (108.6 kg)   SpO2:   97% 100%   I&O's:   Intake/Output Summary (Last 24 hours) at 05/20/16 0759 Last data filed at 05/20/16 0751  Gross per 24 hour  Intake 1340.37 ml  Output   1350 ml  Net  -9.63 ml   TELEMETRY: Reviewed telemetry pt in NSR:     PHYSICAL EXAM General: Well developed, well nourished, in no acute distress Head: Eyes PERRLA, No xanthomas.   Normal cephalic and atramatic  Lungs:   Clear bilaterally to auscultation and percussion. Heart:   HRRR S1 S2 Pulses are 2+ & equal. Abdomen: Bowel sounds are positive, abdomen soft and non-tender without masses Extremities:   No clubbing, cyanosis or edema.  DP +1 Neuro: Alert and oriented X 3. Psych:  Good affect, responds appropriately   LABS: Basic Metabolic Panel:  Recent Labs  07/20/16 1203 05/19/16 1921 05/20/16 0436  NA 134*  --  134*  K 4.6  --  4.7  CL 95*  --  101  CO2 26  --  27  GLUCOSE 429*  --  389*  BUN 14  --  15  CREATININE 1.10 0.89 1.01  CALCIUM 9.6  --  9.3   Liver Function Tests: No results for input(s): AST, ALT, ALKPHOS, BILITOT, PROT, ALBUMIN in the last 72 hours. No results for input(s): LIPASE, AMYLASE in the last 72 hours. CBC:  Recent Labs  05/19/16 1921 05/20/16 0436  WBC 6.8 6.9  HGB 13.9 13.7  HCT 43.2 43.4  MCV 85.0 86.1  PLT 125* 134*   Cardiac Enzymes: No results for input(s): CKTOTAL, CKMB, CKMBINDEX, TROPONINI in the last 72 hours. BNP: Invalid input(s): POCBNP D-Dimer: No results for input(s): DDIMER in the last 72 hours. Hemoglobin A1C: No results for input(s): HGBA1C in the last 72 hours. Fasting Lipid Panel: No results for input(s): CHOL, HDL, LDLCALC,  TRIG, CHOLHDL, LDLDIRECT in the last 72 hours. Thyroid Function Tests:  Recent Labs  05/17/16 1203  TSH 1.12   Anemia Panel: No results for input(s): VITAMINB12, FOLATE, FERRITIN, TIBC, IRON, RETICCTPCT in the last 72 hours. Coag Panel:   Lab Results  Component Value Date   INR 0.90 05/17/2016   INR 1.34 01/20/2016   INR 1.05 01/20/2016    RADIOLOGY: No results found.    ASSESSMENT/PLAN:   1.  ASCAD s/p remote CABG 2.  USAP - s/p cath yesterday showing severe 3 vessel ASCAD with patent LIMA to LAD, 95% SVG to OM/left PLA and occluded portion of SVG jump graft to OM1 to left PLA, complex  Bifurcation lesion involving LCx and OM1 ostium s/p bifurcatoin stenting with DES.  Now on DAPT most likely lifelong due to bifurcation stents.  Restart BB and continue statin. 3.  IDDM - BS poorly controlled 389 this am.  Creatinine stable post cath. Metformin on hold until tomorrow.  Will cover with SS Insulin today but increase to resistant/steroid coverage.  Keep one more day since BS are high.  He is also on steroids which is complicating this.  Check HbA1C.  Austin Cieslik  Mayford Knife, MD  05/20/2016  7:59 AM

## 2016-05-21 ENCOUNTER — Encounter (HOSPITAL_COMMUNITY): Payer: Self-pay | Admitting: Cardiology

## 2016-05-21 DIAGNOSIS — Z955 Presence of coronary angioplasty implant and graft: Secondary | ICD-10-CM

## 2016-05-21 DIAGNOSIS — E118 Type 2 diabetes mellitus with unspecified complications: Secondary | ICD-10-CM | POA: Diagnosis not present

## 2016-05-21 DIAGNOSIS — E1165 Type 2 diabetes mellitus with hyperglycemia: Secondary | ICD-10-CM | POA: Diagnosis not present

## 2016-05-21 DIAGNOSIS — M069 Rheumatoid arthritis, unspecified: Secondary | ICD-10-CM

## 2016-05-21 DIAGNOSIS — E785 Hyperlipidemia, unspecified: Secondary | ICD-10-CM | POA: Diagnosis not present

## 2016-05-21 DIAGNOSIS — F319 Bipolar disorder, unspecified: Secondary | ICD-10-CM | POA: Diagnosis not present

## 2016-05-21 DIAGNOSIS — I200 Unstable angina: Secondary | ICD-10-CM | POA: Diagnosis not present

## 2016-05-21 DIAGNOSIS — I25710 Atherosclerosis of autologous vein coronary artery bypass graft(s) with unstable angina pectoris: Secondary | ICD-10-CM | POA: Diagnosis not present

## 2016-05-21 DIAGNOSIS — I2510 Atherosclerotic heart disease of native coronary artery without angina pectoris: Secondary | ICD-10-CM | POA: Diagnosis not present

## 2016-05-21 DIAGNOSIS — I25110 Atherosclerotic heart disease of native coronary artery with unstable angina pectoris: Secondary | ICD-10-CM | POA: Diagnosis not present

## 2016-05-21 DIAGNOSIS — I2582 Chronic total occlusion of coronary artery: Secondary | ICD-10-CM | POA: Diagnosis not present

## 2016-05-21 LAB — GLUCOSE, CAPILLARY
Glucose-Capillary: 199 mg/dL — ABNORMAL HIGH (ref 65–99)
Glucose-Capillary: 281 mg/dL — ABNORMAL HIGH (ref 65–99)

## 2016-05-21 MED ORDER — METOPROLOL TARTRATE 25 MG PO TABS
25.0000 mg | ORAL_TABLET | Freq: Two times a day (BID) | ORAL | Status: DC
Start: 2016-05-21 — End: 2016-05-26

## 2016-05-21 MED ORDER — INSULIN ASPART 100 UNIT/ML ~~LOC~~ SOLN: 0.0000 [IU] | Freq: Three times a day (TID) | SUBCUTANEOUS | Status: DC

## 2016-05-21 MED ORDER — TICAGRELOR 90 MG PO TABS: 90.0000 mg | ORAL_TABLET | Freq: Two times a day (BID) | ORAL | Status: DC

## 2016-05-21 MED ORDER — INSULIN GLARGINE 300 UNIT/ML ~~LOC~~ SOPN: 48.0000 [IU] | PEN_INJECTOR | Freq: Every day | SUBCUTANEOUS | Status: DC

## 2016-05-21 NOTE — Care Management Note (Signed)
Case Management Note  Patient Details  Name: Austin Arnold MRN: [id number] Date of Birth: 1966/04/09  Subjective/Objective:                  chest pain Action/Plan: discharge planning Expected Discharge Date:                  Expected Discharge Plan:  Home/Self Care  In-House Referral:     Discharge planning Services  CM Consult, Medication Assistance  Post Acute Care Choice:  NA Choice offered to:  NA  DME Arranged:  N/A DME Agency:  NA  HH Arranged:  NA HH Agency:  NA  Status of Service:  Completed, signed off  Medicare Important Message Given:    Date Medicare IM Given:    Medicare IM give by:    Date Additional Medicare IM Given:    Additional Medicare Important Message give by:     If discussed at [place] of Stay Meetings, dates discussed:    Additional Comments: CM met with pt and gave pt Tujeo coupon for today's discharge prescription; MD has filled out pt asst form and pt verbalized understanding he will fill out the financial section of asst form and have the office staff fax the form to [employer] pt asst.  No other CM needs were communicated. Dellie Catholic, RN 05/21/2016, 11:09 AM

## 2016-05-21 NOTE — Progress Notes (Signed)
PROGRESS NOTE  Austin Arnold  ZOX:[id number] DOB: May 15, 1966  DOA: 05/19/2016 PCP: [name], MD   Brief Narrative:  50 year old male with PMH of polysubstance abuse, bipolar disorder on lithium, GERD, CAD/MI/CABG/PCI with stent, HLD, OSA-waiting for CPAP machine, DM 2/IDDM, admitted to hospital by cardiology service on 05/19/16 for chest pain requiring workup including cardiac catheterization which showed severe three-vessel CAD. Hospitalist service was consulted for assistance with management of poorly controlled DM. Patient has been off of his insulin/Toujeo since January due to cost issues. Improved control in the hospital with Lantus and SSI.   Assessment & Plan:   Active Problems:   DM (diabetes mellitus), type 2, uncontrolled with complications (HCC)   Hyperlipidemia   Bipolar 1 disorder (HCC)   Coronary artery disease involving native coronary artery of native heart without angina pectoris   Accelerating angina (HCC)   Rheumatoid arthritis (HCC)   Stented coronary artery   Poorly controlled DM 2/IDDM with vascular complications - A1c 01/20/16:8. - Patient was supposed to be on Toujeo and metformin as outpatient but has not taken Toujeo since January 2017 because he could not afford the co-pay. Prior to that he states that he was getting this medication as samples from his PCP via a study. Co-pay approximately $300 and patient states that he is on disability. - He is on chronic prednisone without recent change in dosing. - Patient has poor understanding regarding disease process. Extensively educated and seems to have a better understanding now. Titration of insulins was discussed by Dr. Margot Ables on 6/4. - For now continue current dose of Lantus 48 units daily and NovoLog resistant SSI. Metformin was on hold secondary to cath on 6/2 and may be resumed anytime today or at discharge. - Requested case management consultation regarding assistance with medication management at  discharge - Discharge medication options would be one of the following in that order of preference: 1. Toujeo at prior home dose 48 units daily + metformin prior home dose 1000 MG twice a day + NovoLog moderate SSI (as long as he is willing to take it and is affordable) 2. Lantus 48 units daily + metformin and NovoLog SSI as per #1. 3. If patient unable to afford both Toujeo or Lantus, then discussed option of [employer] brand Relion 70/30 insulin with starting dose of 30 units in the morning and 18 units in the evening to begin with and will need to be titrated as outpatient + metformin at prior home dose. Each vial of Relion 70/30 should cost approximately $26 (<$50 per month). Recommended close outpatient follow-up with PCP in 1 week for further management and he verbalized understanding.  CAD s/p remote CABG/unstable angina - Management per primary service/cardiology. Status post cath 6/2 which showed severe three-vessel ASCAD with intervention as per report below. Now on dual antiplatelet therapy which she is likely to need lifelong. Continue metoprolol and rosuvastatin. For chest pain.  Bipolar disorder/depression - Stable without suicidal or homicidal ideations. Continue lithium, Zoloft and trazodone.  Hyperlipidemia - Continue statins  GERD - Not on PPI prior to admission and not symptomatic of this.  OSA - Apparently recently completed sleep study and is awaiting for CPAP machine.  Rheumatoid arthritis - Continue home dose of chronic prednisone.  Mild thrombocytopenia - Unclear etiology. Improving. No bleeding reported. Close outpatient follow-up.    DVT prophylaxis:  Subcutaneous heparin Code Status:  Full Family Communication:  Discussed with patient. No family at bedside. Disposition Plan:  Stable from IM standpoint for discharge. Eventual  decision for discharge per primary service.   Consultants:   Cardiology  Procedures:   Coronary/bypass graft CTO intervention,  left heart cath and coronary/graft angiography  Conclusions  Occluded native LAD. LIMA to LAD graft is patent  Occluded OM1. SVG to OM1/left PLA has a 95% lesion proximally. Occluded portion of SVG jump graft from OM1 to left PLA.  Occluded SVG to ramus.  1st Mrg lesion, 100% stenosed- chronic total occlusion. Post intervention with a Synergy DES, postdilated to > 3 mm, there is a 0% residual stenosis.  Complex, bifurcation disease in the circumflex and large OM1 ostium. Bifurcation stenting performed successfully with culotte stenting technique, 3.0 x 24 in the circumflex into OM, and 3.0 x 16 in the main circumflex; final kissing balloon angioplasty performed.  LVEDP 11 mm Hg.  Continue dual antiplatelet therapy for at least a year. He needs aggressive secondary prevention including diet control. He will likely need lifelong clopidogrel given the bifurcation stents. Watch overnight. Hydrate well. Will need echo at some point to evaluate LV function. Possible discharge tomorrow if he is doing well.   Antimicrobials:   None    Subjective:  No chest pain. Anxious to go home.  Objective:  Filed Vitals:   05/20/16 1258 05/20/16 2100 05/21/16 0058 05/21/16 0610  BP: 126/62 143/97 104/48 121/71  Pulse:  88 77 86  Temp: 97.8 F (36.6 C) 98.3 F (36.8 C) 98.1 F (36.7 C) 98 F (36.7 C)  TempSrc:  Oral Oral Oral  Resp:  18 18 18   Height:      Weight:    106.641 kg (235 lb 1.6 oz)  SpO2: 100% 100% 100% 100%    Intake/Output Summary (Last 24 hours) at 05/21/16 1055 Last data filed at 05/21/16 0928  Gross per 24 hour  Intake    800 ml  Output      0 ml  Net    800 ml   Filed Weights   05/19/16 0838 05/20/16 0259 05/21/16 0610  Weight: 104.327 kg (230 lb) 108.6 kg (239 lb 6.7 oz) 106.641 kg (235 lb 1.6 oz)    Examination:  General exam: Pleasant middle-aged male sitting up comfortably in bed. Respiratory system: Clear to auscultation. Respiratory effort  normal. Cardiovascular system: S1 & S2 heard, RRR. No JVD, murmurs, rubs, gallops or clicks. No pedal edema. SR with frequent PVCs and occasional trigeminy. Gastrointestinal system: Abdomen is nondistended, soft and nontender. No organomegaly or masses felt. Normal bowel sounds heard. Central nervous system: Alert and oriented. No focal neurological deficits. Extremities: Symmetric 5 x 5 power. Skin: No rashes, lesions or ulcers Psychiatry: Judgement and insight appear normal. Mood & affect appropriate.     Data Reviewed: I have personally reviewed following labs and imaging studies  CBC:  Recent Labs Lab 05/17/16 1203 05/19/16 1921 05/20/16 0436  WBC 7.6 6.8 6.9  HGB 15.9 13.9 13.7  HCT 48.3 43.2 43.4  MCV 87.2 85.0 86.1  PLT 150 125* 134*   Basic Metabolic Panel:  Recent Labs Lab 05/17/16 1203 05/19/16 1921 05/20/16 0436  NA 134*  --  134*  K 4.6  --  4.7  CL 95*  --  101  CO2 26  --  27  GLUCOSE 429*  --  389*  BUN 14  --  15  CREATININE 1.10 0.89 1.01  CALCIUM 9.6  --  9.3   GFR: Estimated Creatinine Clearance: 115.5 mL/min (by C-G formula based on Cr of 1.01). Liver Function Tests: No results  for input(s): AST, ALT, ALKPHOS, BILITOT, PROT, ALBUMIN in the last 168 hours. No results for input(s): LIPASE, AMYLASE in the last 168 hours. No results for input(s): AMMONIA in the last 168 hours. Coagulation Profile:  Recent Labs Lab 05/17/16 1203  INR 0.90   Cardiac Enzymes: No results for input(s): CKTOTAL, CKMB, CKMBINDEX, TROPONINI in the last 168 hours. BNP (last 3 results) No results for input(s): PROBNP in the last 8760 hours. HbA1C: No results for input(s): HGBA1C in the last 72 hours. CBG:  Recent Labs Lab 05/20/16 0343 05/20/16 1200 05/20/16 1623 05/20/16 2200 05/21/16 0651  GLUCAP 428* 253* 238* 171* 199*   Lipid Profile: No results for input(s): CHOL, HDL, LDLCALC, TRIG, CHOLHDL, LDLDIRECT in the last 72 hours. Thyroid Function  Tests: No results for input(s): TSH, T4TOTAL, FREET4, T3FREE, THYROIDAB in the last 72 hours. Anemia Panel: No results for input(s): VITAMINB12, FOLATE, FERRITIN, TIBC, IRON, RETICCTPCT in the last 72 hours.  Sepsis Labs: No results for input(s): PROCALCITON, LATICACIDVEN in the last 168 hours.  No results found for this or any previous visit (from the past 240 hour(s)).       Radiology Studies: No results found.      Scheduled Meds: . aspirin  81 mg Oral Daily  . heparin  5,000 Units Subcutaneous Q8H  . insulin aspart  0-20 Units Subcutaneous TID WC  . insulin aspart  0-5 Units Subcutaneous QHS  . insulin glargine  48 Units Subcutaneous Daily  . lithium  600 mg Oral Daily  . metoprolol tartrate  25 mg Oral BID  . predniSONE  5 mg Oral BID  . rosuvastatin  40 mg Oral Daily  . sertraline  50 mg Oral Daily  . sodium chloride flush  3 mL Intravenous Q12H  . ticagrelor  90 mg Oral BID  . traZODone  100 mg Oral QHS   Continuous Infusions:       Time spent: 45 minutes.    [facility], MD [facility] Pager [phone]  If 7PM-7AM, please contact night-coverage [email or web address] Password [email or web address] 05/21/2016, 10:55 AM

## 2016-05-21 NOTE — Progress Notes (Signed)
Patient Name: Austin Arnold Date of Encounter: 05/21/2016  Active Problems:   DM (diabetes mellitus), type 2, uncontrolled with complications (HCC)   Hyperlipidemia   Bipolar 1 disorder (HCC)   Coronary artery disease involving native coronary artery of native heart without angina pectoris   Accelerating angina (HCC)   Rheumatoid arthritis (HCC)   Stented coronary artery   Length of Stay:   SUBJECTIVE  Minimal residual chest discomfort, better than last week. No dyspnea, no bleeding  CURRENT MEDS . aspirin  81 mg Oral Daily  . heparin  5,000 Units Subcutaneous Q8H  . insulin aspart  0-20 Units Subcutaneous TID WC  . insulin aspart  0-5 Units Subcutaneous QHS  . insulin glargine  48 Units Subcutaneous Daily  . lithium  600 mg Oral Daily  . metoprolol tartrate  25 mg Oral BID  . predniSONE  5 mg Oral BID  . rosuvastatin  40 mg Oral Daily  . sertraline  50 mg Oral Daily  . sodium chloride flush  3 mL Intravenous Q12H  . ticagrelor  90 mg Oral BID  . traZODone  100 mg Oral QHS    OBJECTIVE   Intake/Output Summary (Last 24 hours) at 05/21/16 1150 Last data filed at 05/21/16 0928  Gross per 24 hour  Intake    560 ml  Output      0 ml  Net    560 ml   Filed Weights   05/19/16 0838 05/20/16 0259 05/21/16 0610  Weight: 104.327 kg (230 lb) 108.6 kg (239 lb 6.7 oz) 106.641 kg (235 lb 1.6 oz)    PHYSICAL EXAM Filed Vitals:   05/20/16 1258 05/20/16 2100 05/21/16 0058 05/21/16 0610  BP: 126/62 143/97 104/48 121/71  Pulse:  88 77 86  Temp: 97.8 F (36.6 C) 98.3 F (36.8 C) 98.1 F (36.7 C) 98 F (36.7 C)  TempSrc:  Oral Oral Oral  Resp:  18 18 18   Height:      Weight:    106.641 kg (235 lb 1.6 oz)  SpO2: 100% 100% 100% 100%   General: Alert, oriented x3, no distress Head: no evidence of trauma, PERRL, EOMI, no exophtalmos or lid lag, no myxedema, no xanthelasma; normal ears, nose and oropharynx Neck: normal jugular venous pulsations and no hepatojugular reflux;  brisk carotid pulses without delay and no carotid bruits Chest: clear to auscultation, no signs of consolidation by percussion or palpation, normal fremitus, symmetrical and full respiratory excursions Cardiovascular: normal position and quality of the apical impulse, regular rhythm, normal first and second heart sounds, no rubs or gallops, no murmur Abdomen: no tenderness or distention, no masses by palpation, no abnormal pulsatility or arterial bruits, normal bowel sounds, no hepatosplenomegaly Extremities: no clubbing, cyanosis or edema; 2+ radial, ulnar and brachial pulses bilaterally; 2+ right femoral, posterior tibial and dorsalis pedis pulses; 2+ left femoral, posterior tibial and dorsalis pedis pulses; no subclavian or femoral bruits Neurological: grossly nonfocal  LABS  CBC  Recent Labs  05/19/16 1921 05/20/16 0436  WBC 6.8 6.9  HGB 13.9 13.7  HCT 43.2 43.4  MCV 85.0 86.1  PLT 125* 134*   Basic Metabolic Panel  Recent Labs  05/19/16 1921 05/20/16 0436  NA  --  134*  K  --  4.7  CL  --  101  CO2  --  27  GLUCOSE  --  389*  BUN  --  15  CREATININE 0.89 1.01  CALCIUM  --  9.3    Radiology Studies  Imaging results have been reviewed and No results found.  TELE NSR    ASSESSMENT AND PLAN  Reviewed cath findings and treatment plan. DC home. Reinforced need for compliance with DAPT, without any interruption for at least 3 months, but likely lifelong therapy. Insulin plan per Dr. Richardean Chimera note. Can add low dose nitrates if angina persists (small ramus intermedius has also lost SVG, may have a small area of ischemia). Early Cardiology F/U 2 weeks.  Thurmon Fair, MD, [facility] [phone] office [phone] pager 05/21/2016 11:50 AM

## 2016-05-21 NOTE — Discharge Summary (Signed)
Discharge Summary    Patient ID: Austin Arnold,  MRN: [id number], DOB/AGE: 1966/05/03 50 y.o.  Admit date: 05/19/2016 Discharge date: 05/21/2016  Primary Care Provider: [facility] Primary Cardiologist: Dr. Royann Shivers  Discharge Diagnoses    Active Problems:   DM (diabetes mellitus), type 2, uncontrolled with complications (HCC)   Hyperlipidemia   Bipolar 1 disorder (HCC)   Coronary artery disease involving native coronary artery of native heart without angina pectoris   Accelerating angina (HCC)   Rheumatoid arthritis (HCC)   Stented coronary artery   Allergies Allergies  Allergen Reactions  . Sulfa Antibiotics Other (See Comments)    Reaction unknown occurred during childhood    Diagnostic Studies/Procedures    LHC: 05/19/2016   Conclusion    1. Occluded native LAD. LIMA to LAD graft is patent 2. Occluded OM1. SVG to OM1/left PLA has a 95% lesion proximally. Occluded portion of SVG jump graft from OM1 to left PLA. 3. Occluded SVG to ramus. 4. 1st Mrg lesion, 100% stenosed- chronic total occlusion. Post intervention with a Synergy DES, postdilated to > 3 mm, there is a 0% residual stenosis. 5. Complex, bifurcation disease in the circumflex and large OM1 ostium. Bifurcation stenting performed successfully with culotte stenting technique, 3.0 x 24 in the circumflex into OM, and 3.0 x 16 in the main circumflex; final kissing balloon angioplasty performed. 6. LVEDP 11 mm Hg.  Continue dual antiplatelet therapy for at least a year. He needs aggressive secondary prevention including diet control. He will likely need lifelong clopidogrel given the bifurcation stents. Watch overnight. Hydrate well. Will need echo at some point to evaluate LV function. Possible discharge tomorrow if he is doing well.        Coronary Diagrams    Diagnostic Diagram           Post-Intervention Diagram               _____________   History of Present Illness      Austin Arnold is a 50 yo male with PMH of multivessel coronary artery disease who underwent coronary bypass surgery with Dr. Donata Clay roughly 4 months ago. He presented with a non-ST segment elevation myocardial infarction on January 5 and surgery was performed on 01/20/2016. He was last seen in the office on 04/14/2016 and reported for financial reasons he had not been able to participate in cardiac rehab and had not been taking his Brilinta. At this visit he continued to report chest discomfort, and had recently undergone a stress test back in 03/2016 that showed no evidence of reversible ischemia. 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 He was also set up for outpatient sleep study. Patient made return phone call to the office a few weeks later reporting the chest pain had continued and prednisone was not helping. During this time, plans were made to set Austin Arnold up for re-catheterization given presentation of pain and persistent symptoms.   Hospital Course     Consults: Family Medicine   Austin Arnold presented on 05/19/2016 for outpatient LHC with Dr. Eldridge Dace. Assess was obtained through the left radial artery, and received a DES to the 1st Mrg lesion which was 100% stenosed, but 0% residual stenosis, along with bifurcation stenting with DES in the Crx to OM and Main Crx with balloon angioplasty. Recommendations included continuation of dual  antiplatelet therapy for at least one year. Aggressive secondary prevention with emphasis on diet and DM management. Most likely to need lifelong Plavix given the bifurcation stents. He was keep overnight with plans to discharge in the morning.   On 05/19/2016 he was seen by Dr. Mayford Knife and noted to have elevated blood sugars. The decision was made to keep and cover with SS insulin while continuing to  hold his metformin. Consultation was made to [facility] Medicine service regarding assistance with Insulin dosage and affordable options. He was seen by Dr. Waymon Amato who recommended -Toujeo at prior home dose 48 units daily + metformin prior home dose 1000 MG twice a day + NovoLog moderate SSI for home dosing.   He was seen and assessed on 05/21/2016 by Dr. Royann Shivers and determined stable for discharge home. Vital signs, cath site and labs stable. I has sent a message to scheduling to call patient for his follow up appt. Also given written Rx for Brilinta to use with discount card. Instructed patient to start his metformin back this evening.  _____________  Discharge Vitals Blood pressure 124/66, pulse 69, temperature 97.9 F (36.6 C), temperature source Oral, resp. rate 20, height 6\' 3"  (1.905 m), weight 235 lb 1.6 oz (106.641 kg), SpO2 100 %.  Filed Weights   05/19/16 0838 05/20/16 0259 05/21/16 0610  Weight: 230 lb (104.327 kg) 239 lb 6.7 oz (108.6 kg) 235 lb 1.6 oz (106.641 kg)    Labs & Radiologic Studies    CBC  Recent Labs  05/19/16 1921 05/20/16 0436  WBC 6.8 6.9  HGB 13.9 13.7  HCT 43.2 43.4  MCV 85.0 86.1  PLT 125* 134*   Basic Metabolic Panel  Recent Labs  05/19/16 1921 05/20/16 0436  NA  --  134*  K  --  4.7  CL  --  101  CO2  --  27  GLUCOSE  --  389*  BUN  --  15  CREATININE 0.89 1.01  CALCIUM  --  9.3   _____________  No results found. Disposition   Pt is being discharged home today in good condition.  Follow-up Plans & Appointments    Follow-up Information    Follow up with 07/20/16, MD.   Specialty:  Cardiology   Why:  The office will call you with a 2 week follow up appt.    Contact information:   [address] [place] [phone]      Discharge Instructions    Amb Referral to Cardiac Rehabilitation    Complete by:  As directed   Diagnosis:  Coronary Stents     Diet - low sodium heart healthy    Complete  by:  As directed      Diet Carb Modified    Complete by:  As directed      Discharge instructions    Complete by:  As directed   PLEASE REMEMBER TO BRING ALL OF YOUR MEDICATIONS TO EACH OF YOUR FOLLOW-UP OFFICE VISITS.  PLEASE ATTEND ALL SCHEDULED FOLLOW-UP APPOINTMENTS.   Activity: Increase activity slowly as tolerated. You may shower, but no soaking baths (or swimming) for 1 week. No driving for 24 hours. No lifting over 5 lbs for 1 week. No sexual activity for 1 week.   You May Return to Work: in 1 week (if applicable)  Wound Care: You may wash cath site gently with soap and water. Keep cath site clean and dry. If you notice pain, swelling, bleeding or pus  at your cath site, please call [phone].     Increase activity slowly    Complete by:  As directed            Discharge Medications   Current Discharge Medication List    START taking these medications   Details  insulin aspart (NOVOLOG) 100 UNIT/ML injection Inject 0-15 Units into the skin 3 (three) times daily with meals. Qty: 10 mL, Refills: 6    ticagrelor (BRILINTA) 90 MG TABS tablet Take 1 tablet (90 mg total) by mouth 2 (two) times daily. Qty: 60 tablet, Refills: 0      CONTINUE these medications which have CHANGED   Details  Insulin Glargine (TOUJEO SOLOSTAR) 300 UNIT/ML SOPN Inject 48 Units into the skin daily. Reported on 04/27/2016 Qty: 5 pen, Refills: 0      CONTINUE these medications which have NOT CHANGED   Details  aspirin 81 MG tablet Take 81 mg by mouth daily.    lithium 600 MG capsule Take 600 mg by mouth daily.    metFORMIN (GLUCOPHAGE-XR) 500 MG 24 hr tablet Take 1,000 mg by mouth 2 (two) times daily.    nitroGLYCERIN (NITROSTAT) 0.4 MG SL tablet Place 1 tablet (0.4 mg total) under the tongue every 5 (five) minutes x 3 doses as needed for chest pain. Qty: 25 tablet, Refills: 3    predniSONE (DELTASONE) 10 MG tablet Take 5 mg by mouth 2 (two) times daily.    rosuvastatin (CRESTOR) 40 MG  tablet Take 1 tablet (40 mg total) by mouth daily. Qty: 90 tablet, Refills: 3    traZODone (DESYREL) 100 MG tablet Take 100 mg by mouth daily.    ZOLOFT 50 MG tablet Take 1 tablet (50 mg total) by mouth daily. Qty: 90 tablet, Refills: 3    metoprolol tartrate (LOPRESSOR) 25 MG tablet Take 1 tablet (25 mg total) by mouth 2 (two) times daily. Qty: 60 tablet, Refills: 5         Aspirin prescribed at discharge?  Yes High Intensity Statin Prescribed? (Lipitor 40-80mg  or Crestor 20-40mg ): Yes Beta Blocker Prescribed? Yes For EF <40%, was ACEI/ARB Prescribed? No:  ADP Receptor Inhibitor Prescribed? (i.e. Plavix etc.-Includes Medically Managed Patients): Yes For EF <40%, Aldosterone Inhibitor Prescribed? No:  Was EF assessed during THIS hospitalization? Yes Was Cardiac Rehab II ordered? (Included Medically managed Patients): Yes   Outstanding Labs/Studies   Hgb A1C ordered during admission, but pending at time of discharge. Will need follow up Echo in the future.   Duration of Discharge Encounter   Greater than 30 minutes including physician time.  Signed, Laverda Page NP-C 05/21/2016, 1:53 PM

## 2016-05-22 ENCOUNTER — Telehealth: Payer: Self-pay | Admitting: Cardiovascular Disease

## 2016-05-22 ENCOUNTER — Encounter (HOSPITAL_COMMUNITY): Payer: Self-pay | Admitting: Interventional Cardiology

## 2016-05-22 LAB — HEMOGLOBIN A1C
Hgb A1c MFr Bld: 11.5 % — ABNORMAL HIGH (ref 4.8–5.6)
Mean Plasma Glucose: 283 mg/dL

## 2016-05-22 LAB — GLUCOSE, CAPILLARY: Glucose-Capillary: 195 mg/dL — ABNORMAL HIGH (ref 65–99)

## 2016-05-22 NOTE — Telephone Encounter (Signed)
New message   Pt is calling he states he has been waiting on a call from the   RN, pt verbalized that he keeps calling multiple times since his discharge and he never gets a returned call   Pt verbalized that he is very upset because the communication is not good  He want me to inform the RN that he is expecting a call today  Or he will make an official complaint ( pt apologized to me for raising his voice)   Please call patient

## 2016-05-22 NOTE — Telephone Encounter (Signed)
Spoke with patient and scheduled 2 week post hospital ov Apologized and advised this was the first message received

## 2016-05-23 ENCOUNTER — Ambulatory Visit (INDEPENDENT_AMBULATORY_CARE_PROVIDER_SITE_OTHER): Payer: Medicare Other | Admitting: Surgical

## 2016-05-23 ENCOUNTER — Encounter: Payer: Self-pay | Admitting: Cardiovascular Disease

## 2016-05-23 ENCOUNTER — Other Ambulatory Visit: Payer: Self-pay | Admitting: *Deleted

## 2016-05-23 VITALS — BP 118/72 | HR 73 | Resp 20 | Ht 75.0 in | Wt 235.0 lb

## 2016-05-23 DIAGNOSIS — K122 Cellulitis and abscess of mouth: Secondary | ICD-10-CM | POA: Diagnosis not present

## 2016-05-23 DIAGNOSIS — I2510 Atherosclerotic heart disease of native coronary artery without angina pectoris: Secondary | ICD-10-CM

## 2016-05-23 DIAGNOSIS — Z951 Presence of aortocoronary bypass graft: Secondary | ICD-10-CM | POA: Diagnosis not present

## 2016-05-23 MED ORDER — AMBULATORY NON FORMULARY MEDICATION: 81.0000 mg | Freq: Every day | Status: DC

## 2016-05-23 MED ORDER — AMBULATORY NON FORMULARY MEDICATION: 90.0000 mg | Freq: Two times a day (BID) | Status: DC

## 2016-05-23 NOTE — Patient Instructions (Signed)
Patient was encouraged verbally to go to the emergency department for further evaluation and treatment of angina.

## 2016-05-23 NOTE — Progress Notes (Signed)
[address]       [place] [address]             [phone]                  Kaulder Ann [facility] Medical Record #[id number] Date of Birth: [birth year]  Referring IE:PPIRJ, Barry Dienes, MD Primary Cardiology: Ann Held, MD  Chief Complaint:  Follow Up Visit  OPERATIVE REPORT  OPERATION:  1. Coronary artery bypass grafting x4 (left internal mammary artery to  left anterior descending coronary artery, saphenous vein graft to  ramus intermediate, sequential saphenous vein graft to obtuse  marginal continuing to the posterior lateral branch of the distal  circumflex).  2. Endoscopic harvest of right leg greater saphenous vein.  PREOPERATIVE DIAGNOSES: Unstable angina, non-ST elevation myocardial  infarction with severe 3-vessel coronary disease, and moderate LV  dysfunction.  POSTOPERATIVE DIAGNOSES: Unstable angina, non-ST elevation myocardial  infarction with severe 3-vessel coronary disease, and moderate LV  dysfunction.  SURGEON: Kerin Perna, MD  ASSISTANT: Lowella Dandy, PA-C  ANESTHESIA: General.     History of Present Illness:    The patient is a 50 year old male status post the above described procedure in February of this year. Most recently he was taken to the cardiac catheterization lab last Friday where several stents were placed. The following conclusions were noted:  Left Heart Cath and Cors/Grafts Angiography    Conclusion     Occluded native LAD. LIMA to LAD graft is patent  Occluded OM1. SVG to OM1/left PLA has a 95% lesion proximally. Occluded portion of SVG jump graft from OM1 to left PLA.  Occluded SVG to ramus.  1st Mrg lesion, 100% stenosed- chronic total occlusion. Post intervention with a Synergy DES, postdilated to > 3 mm, there is a 0% residual stenosis.  Complex, bifurcation disease in the circumflex and large OM1 ostium. Bifurcation stenting performed successfully with culotte stenting  technique, 3.0 x 24 in the circumflex into OM, and 3.0 x 16 in the main circumflex; final kissing balloon angioplasty performed.  LVEDP 11 mm Hg.   The patient states today he is having some shortness of breath as well as midsternal chest pain with some radiation to the left arm. He also has some pain in the xiphoid process of the sternum.    Zubrod Score: At the time of surgery this patient's most appropriate activity status/level should be described as: []     0    Normal activity, no symptoms []     1    Restricted in physical strenuous activity but ambulatory, able to do out light work []     2    Ambulatory and capable of self care, unable to do work activities, up and about                 >50 % of waking hours                                                                                   []     3    Only limited self care, in bed greater than 50% of waking hours []   4    Completely disabled, no self care, confined to bed or chair []     5    Moribund  History  Smoking status  . Former Smoker -- 11 years  . Types: Cigars  . Quit date: 01/11/2015  Smokeless tobacco  . Never Used       Allergies  Allergen Reactions  . Sulfa Antibiotics Other (See Comments)    Reaction unknown occurred during childhood    Current Outpatient Prescriptions  Medication Sig Dispense Refill  . AMBULATORY NON FORMULARY MEDICATION Take 90 mg by mouth 2 (two) times daily. Medication Name: BRILINTA 90 mg BID (TWILIGHT Research Study PROVIDED)    . AMBULATORY NON FORMULARY MEDICATION Take 81 mg by mouth daily. Medication Name: ASA 81 mg Daily (TWILIGHT Research Study PROVIDED)    . insulin aspart (NOVOLOG) 100 UNIT/ML injection Inject 0-15 Units into the skin 3 (three) times daily with meals. 10 mL 6  . Insulin Glargine (TOUJEO SOLOSTAR) 300 UNIT/ML SOPN Inject 48 Units into the skin daily. Reported on 04/27/2016 5 pen 0  . lithium 600 MG capsule Take 600 mg by mouth daily.    . metFORMIN  (GLUCOPHAGE-XR) 500 MG 24 hr tablet Take 1,000 mg by mouth 2 (two) times daily.    . metoprolol tartrate (LOPRESSOR) 25 MG tablet Take 1 tablet (25 mg total) by mouth 2 (two) times daily. 60 tablet 6  . nitroGLYCERIN (NITROSTAT) 0.4 MG SL tablet Place 1 tablet (0.4 mg total) under the tongue every 5 (five) minutes x 3 doses as needed for chest pain. 25 tablet 3  . predniSONE (DELTASONE) 10 MG tablet Take 5 mg by mouth 2 (two) times daily.    . rosuvastatin (CRESTOR) 40 MG tablet Take 1 tablet (40 mg total) by mouth daily. 90 tablet 3  . traZODone (DESYREL) 100 MG tablet Take 100 mg by mouth daily.    06/27/2016 ZOLOFT 50 MG tablet Take 1 tablet (50 mg total) by mouth daily. 90 tablet 3   No current facility-administered medications for this visit.       Physical Exam: BP 118/72 mmHg  Pulse 73  Resp 20  Ht 6\' 3"  (1.905 m)  Wt 235 lb (106.595 kg)  BMI 29.37 kg/m2  SpO2 98%  General appearance: alert, cooperative and no distress Heart: irregularly irregular rhythm Lungs: clear to auscultation bilaterally Extremities: No edema Wound: Incisions healed well. There is some discomfort with palpation in the region of the xiphoid. Wounds:  Diagnostic Studies & Laboratory data:         Recent Radiology Findings: No results found.    I have independently reviewed the above radiology findings and reviewed findings  with the patient.  Recent Labs: Lab Results  Component Value Date   WBC 6.9 05/20/2016   HGB 13.7 05/20/2016   HCT 43.4 05/20/2016   PLT 134* 05/20/2016   GLUCOSE 389* 05/20/2016   CHOL 188 01/13/2016   TRIG 326* 01/13/2016   HDL 34* 01/13/2016   LDLCALC 89 01/13/2016   ALT 20 01/13/2016   AST 47* 01/13/2016   NA 134* 05/20/2016   K 4.7 05/20/2016   CL 101 05/20/2016   CREATININE 1.01 05/20/2016   BUN 15 05/20/2016   CO2 27 05/20/2016   TSH 1.12 05/17/2016   INR 0.90 05/17/2016   HGBA1C 11.5* 05/20/2016      Assessment / Plan:  I'm uncertain what is going on  currently. This does appear to be angina. In the setting of graft  failure as well as recent stenting procedures last Friday I cannot rule out a ongoing cardiac event. I discussed this with Dr. Allyson Sabal who is on-call for cardiology and he recommends sending the patient to the emergency department. The patient is quite reluctant to take this advice. He is quite angry and feels as though he is not getting proper care. He is considering going to [facility] for further treatment but has not decided. I reiterated that I am concerned with the current process going on and describes as potentially life-threatening. He says he will discuss it with his wife and decide whether he will follow this advice.         Akiya Morr E 05/23/2016 2:44 PM

## 2016-05-23 NOTE — Patient Outreach (Addendum)
Telephone call after cardiac cath - transition back home. Austin Arnold was not home. I left a message with their son and asked that he return my call.  Almetta Lovely GNP-BC [facility] Care Manager [phone]  Pt returned my call last night at 9:00 pm. He reports that he still has CP, SOB, is very frustrated that part of his CABG failed and that he is still having this problems. He is very depressed. I listened to him and offered support. He says he is communicating with Dr. Rubie Maid about his condition and what to do next via my chart. He does have his medications. I have told him I will check in with him at the end of the week to share any new thoughts on his situation and offer support.  Almetta Lovely [facility] Care Manager [phone]

## 2016-05-25 ENCOUNTER — Ambulatory Visit: Payer: Self-pay | Admitting: *Deleted

## 2016-05-26 ENCOUNTER — Ambulatory Visit (INDEPENDENT_AMBULATORY_CARE_PROVIDER_SITE_OTHER): Payer: Medicare Other | Admitting: Cardiovascular Disease

## 2016-05-26 ENCOUNTER — Encounter: Payer: Self-pay | Admitting: Cardiovascular Disease

## 2016-05-26 VITALS — BP 116/70 | HR 85 | Ht 74.0 in | Wt 238.0 lb

## 2016-05-26 DIAGNOSIS — E785 Hyperlipidemia, unspecified: Secondary | ICD-10-CM

## 2016-05-26 DIAGNOSIS — E1165 Type 2 diabetes mellitus with hyperglycemia: Secondary | ICD-10-CM

## 2016-05-26 DIAGNOSIS — E118 Type 2 diabetes mellitus with unspecified complications: Secondary | ICD-10-CM | POA: Diagnosis not present

## 2016-05-26 DIAGNOSIS — I200 Unstable angina: Secondary | ICD-10-CM | POA: Diagnosis not present

## 2016-05-26 DIAGNOSIS — I25110 Atherosclerotic heart disease of native coronary artery with unstable angina pectoris: Secondary | ICD-10-CM | POA: Diagnosis not present

## 2016-05-26 DIAGNOSIS — I255 Ischemic cardiomyopathy: Secondary | ICD-10-CM | POA: Diagnosis not present

## 2016-05-26 DIAGNOSIS — I2510 Atherosclerotic heart disease of native coronary artery without angina pectoris: Secondary | ICD-10-CM

## 2016-05-26 DIAGNOSIS — IMO0002 Reserved for concepts with insufficient information to code with codable children: Secondary | ICD-10-CM

## 2016-05-26 DIAGNOSIS — F319 Bipolar disorder, unspecified: Secondary | ICD-10-CM

## 2016-05-26 DIAGNOSIS — Z794 Long term (current) use of insulin: Secondary | ICD-10-CM

## 2016-05-26 MED ORDER — ISOSORBIDE MONONITRATE ER 30 MG PO TB24: 30.0000 mg | ORAL_TABLET | Freq: Every day | ORAL | Status: DC

## 2016-05-26 MED ORDER — METOPROLOL TARTRATE 50 MG PO TABS: 50.0000 mg | ORAL_TABLET | Freq: Two times a day (BID) | ORAL | Status: DC
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
## 2016-05-26 NOTE — Patient Instructions (Addendum)
Dr Royann Shivers has recommended making the following medication changes: 1. INCREASE Metoprolol to 50 mg twice daily 2. START Isosorbide mononitrate 30 mg - take 1 tablet by mouth daily  Your physician has requested that you have a left cardiac catheterization with Dr Eldridge Dace on Tuesday, 05/30/2016. Cardiac catheterization is used to diagnose and/or treat various heart conditions. Doctors may recommend this procedure for a number of different reasons. The most common reason is to evaluate chest pain. Chest pain can be a symptom of coronary artery disease (CAD), and cardiac catheterization can show whether plaque is narrowing or blocking your heart's arteries. This procedure is also used to evaluate the valves, as well as measure the blood flow and oxygen levels in different parts of your heart. For further information please visit [email or web address]   Following your catheterization, you will not be allowed to drive for 3 days.  No lifting, pushing, or pulling greater that 10 pounds is allowed for 1 week.  You do not need blood work or a chest x-ray.

## 2016-05-29 ENCOUNTER — Other Ambulatory Visit: Payer: Self-pay | Admitting: *Deleted

## 2016-05-29 ENCOUNTER — Encounter: Payer: Self-pay | Admitting: *Deleted

## 2016-05-29 NOTE — Patient Outreach (Signed)
Attempted follow up phone call. Pt was not at home and I left a message that I would call him on Wednesday.  Almetta Lovely [facility] Care Manager [phone]

## 2016-05-30 ENCOUNTER — Ambulatory Visit (HOSPITAL_COMMUNITY)
Admission: RE | Admit: 2016-05-30 | Discharge: 2016-05-31 | Disposition: A | Discharge status: Home / Self Care | Payer: Medicare Other | Source: Ambulatory Visit | Attending: Interventional Cardiology | Admitting: Interventional Cardiology

## 2016-05-30 ENCOUNTER — Encounter (HOSPITAL_COMMUNITY): Payer: Self-pay | Admitting: General Practice

## 2016-05-30 ENCOUNTER — Encounter (HOSPITAL_COMMUNITY)
Admission: RE | Disposition: A | Discharge status: Home / Self Care | Payer: Self-pay | Source: Ambulatory Visit | Attending: Interventional Cardiology

## 2016-05-30 DIAGNOSIS — G4733 Obstructive sleep apnea (adult) (pediatric): Secondary | ICD-10-CM | POA: Insufficient documentation

## 2016-05-30 DIAGNOSIS — IMO0002 Reserved for concepts with insufficient information to code with codable children: Secondary | ICD-10-CM | POA: Diagnosis present

## 2016-05-30 DIAGNOSIS — Z79899 Other long term (current) drug therapy: Secondary | ICD-10-CM | POA: Insufficient documentation

## 2016-05-30 DIAGNOSIS — I5022 Chronic systolic (congestive) heart failure: Secondary | ICD-10-CM | POA: Insufficient documentation

## 2016-05-30 DIAGNOSIS — I2582 Chronic total occlusion of coronary artery: Secondary | ICD-10-CM | POA: Insufficient documentation

## 2016-05-30 DIAGNOSIS — M069 Rheumatoid arthritis, unspecified: Secondary | ICD-10-CM | POA: Insufficient documentation

## 2016-05-30 DIAGNOSIS — E1165 Type 2 diabetes mellitus with hyperglycemia: Secondary | ICD-10-CM | POA: Diagnosis not present

## 2016-05-30 DIAGNOSIS — I25118 Atherosclerotic heart disease of native coronary artery with other forms of angina pectoris: Secondary | ICD-10-CM | POA: Insufficient documentation

## 2016-05-30 DIAGNOSIS — I252 Old myocardial infarction: Secondary | ICD-10-CM | POA: Insufficient documentation

## 2016-05-30 DIAGNOSIS — E785 Hyperlipidemia, unspecified: Secondary | ICD-10-CM | POA: Diagnosis present

## 2016-05-30 DIAGNOSIS — Z955 Presence of coronary angioplasty implant and graft: Secondary | ICD-10-CM

## 2016-05-30 DIAGNOSIS — Z7984 Long term (current) use of oral hypoglycemic drugs: Secondary | ICD-10-CM | POA: Diagnosis not present

## 2016-05-30 DIAGNOSIS — I208 Other forms of angina pectoris: Secondary | ICD-10-CM

## 2016-05-30 DIAGNOSIS — E118 Type 2 diabetes mellitus with unspecified complications: Secondary | ICD-10-CM

## 2016-05-30 HISTORY — PX: CARDIAC CATHETERIZATION: SHX172

## 2016-05-30 LAB — GLUCOSE, CAPILLARY
GLUCOSE-CAPILLARY: 293 mg/dL — AB (ref 65–99)
GLUCOSE-CAPILLARY: 189 mg/dL — AB (ref 65–99)
GLUCOSE-CAPILLARY: 155 mg/dL — AB (ref 65–99)
GLUCOSE-CAPILLARY: 374 mg/dL — AB (ref 65–99)

## 2016-05-30 LAB — POCT ACTIVATED CLOTTING TIME: ACTIVATED CLOTTING TIME: 307 s

## 2016-05-30 SURGERY — LEFT HEART CATH AND CORS/GRAFTS ANGIOGRAPHY

## 2016-05-30 MED ORDER — SODIUM CHLORIDE 0.9 % IV SOLN: 250.0000 mL | INTRAVENOUS | Status: DC | PRN

## 2016-05-30 MED ORDER — SODIUM CHLORIDE 0.9 % IV SOLN
250.0000 mg | INTRAVENOUS | Status: DC | PRN
  Administered 2016-05-30: 1.75 mg/kg/h via INTRAVENOUS

## 2016-05-30 MED ORDER — MIDAZOLAM HCL 2 MG/2ML IJ SOLN
INTRAMUSCULAR | Status: AC
  Filled 2016-05-30: qty 2

## 2016-05-30 MED ORDER — TRAZODONE HCL 50 MG PO TABS
100.0000 mg | ORAL_TABLET | Freq: Every day | ORAL | Status: DC
  Administered 2016-05-30: 23:00:00 100 mg via ORAL
  Filled 2016-05-30: qty 2

## 2016-05-30 MED ORDER — METOPROLOL TARTRATE 25 MG PO TABS
50.0000 mg | ORAL_TABLET | Freq: Two times a day (BID) | ORAL | Status: DC
  Administered 2016-05-30 – 2016-05-31 (×2): 50 mg via ORAL
  Filled 2016-05-30 (×2): qty 2

## 2016-05-30 MED ORDER — ROSUVASTATIN CALCIUM 20 MG PO TABS
40.0000 mg | ORAL_TABLET | Freq: Every day | ORAL | Status: DC
  Administered 2016-05-30: 40 mg via ORAL
  Filled 2016-05-30: qty 2

## 2016-05-30 MED ORDER — SODIUM CHLORIDE 0.9% FLUSH: 3.0000 mL | Freq: Two times a day (BID) | INTRAVENOUS | Status: DC

## 2016-05-30 MED ORDER — HEPARIN (PORCINE) IN NACL 2-0.9 UNIT/ML-% IJ SOLN
INTRAMUSCULAR | Status: AC
  Filled 2016-05-30: qty 1500

## 2016-05-30 MED ORDER — NITROGLYCERIN 0.4 MG SL SUBL: 0.4000 mg | SUBLINGUAL_TABLET | SUBLINGUAL | Status: DC | PRN

## 2016-05-30 MED ORDER — INSULIN ASPART 100 UNIT/ML ~~LOC~~ SOLN
0.0000 [IU] | Freq: Every day | SUBCUTANEOUS | Status: DC
  Administered 2016-05-30: 5 [IU] via SUBCUTANEOUS

## 2016-05-30 MED ORDER — SODIUM CHLORIDE 0.9% FLUSH
3.0000 mL | Freq: Two times a day (BID) | INTRAVENOUS | Status: DC
  Administered 2016-05-31: 10:00:00 3 mL via INTRAVENOUS

## 2016-05-30 MED ORDER — TICAGRELOR 90 MG PO TABS: 90.0000 mg | ORAL_TABLET | Freq: Two times a day (BID) | ORAL | Status: DC

## 2016-05-30 MED ORDER — IOPAMIDOL (ISOVUE-370) INJECTION 76%
INTRAVENOUS | Status: AC
  Filled 2016-05-30: qty 100

## 2016-05-30 MED ORDER — BIVALIRUDIN BOLUS VIA INFUSION - CUPID
INTRAVENOUS | Status: DC | PRN
  Administered 2016-05-30: 81.675 mg via INTRAVENOUS

## 2016-05-30 MED ORDER — ASPIRIN 81 MG PO CHEW: 81.0000 mg | CHEWABLE_TABLET | ORAL | Status: DC

## 2016-05-30 MED ORDER — SODIUM CHLORIDE 0.9 % WEIGHT BASED INFUSION
3.0000 mL/kg/h | INTRAVENOUS | Status: DC
  Administered 2016-05-30: 3 mL/kg/h via INTRAVENOUS

## 2016-05-30 MED ORDER — FENTANYL CITRATE (PF) 100 MCG/2ML IJ SOLN
INTRAMUSCULAR | Status: AC
  Filled 2016-05-30: qty 2

## 2016-05-30 MED ORDER — LIDOCAINE HCL (PF) 1 % IJ SOLN
INTRAMUSCULAR | Status: AC
  Filled 2016-05-30: qty 30

## 2016-05-30 MED ORDER — LITHIUM CARBONATE 300 MG PO CAPS
600.0000 mg | ORAL_CAPSULE | Freq: Every day | ORAL | Status: DC
  Administered 2016-05-31: 600 mg via ORAL
  Filled 2016-05-30: qty 2

## 2016-05-30 MED ORDER — PREDNISONE 5 MG PO TABS
5.0000 mg | ORAL_TABLET | Freq: Two times a day (BID) | ORAL | Status: DC
  Administered 2016-05-30 – 2016-05-31 (×2): 5 mg via ORAL
  Filled 2016-05-30 (×2): qty 1

## 2016-05-30 MED ORDER — IOPAMIDOL (ISOVUE-370) INJECTION 76%
INTRAVENOUS | Status: DC | PRN
  Administered 2016-05-30: 160 mL via INTRAVENOUS

## 2016-05-30 MED ORDER — ACETAMINOPHEN 325 MG PO TABS: 650.0000 mg | ORAL_TABLET | ORAL | Status: DC | PRN

## 2016-05-30 MED ORDER — ASPIRIN 81 MG PO CHEW
81.0000 mg | CHEWABLE_TABLET | Freq: Every day | ORAL | Status: DC
  Administered 2016-05-31: 81 mg via ORAL
  Filled 2016-05-30: qty 1

## 2016-05-30 MED ORDER — SODIUM CHLORIDE 0.9 % IV SOLN
INTRAVENOUS | Status: AC
  Administered 2016-05-30: 14:00:00 via INTRAVENOUS

## 2016-05-30 MED ORDER — TICAGRELOR 90 MG PO TABS
90.0000 mg | ORAL_TABLET | Freq: Two times a day (BID) | ORAL | Status: DC
  Administered 2016-05-30 – 2016-05-31 (×2): 90 mg via ORAL
  Filled 2016-05-30 (×2): qty 1

## 2016-05-30 MED ORDER — SODIUM CHLORIDE 0.9% FLUSH: 3.0000 mL | INTRAVENOUS | Status: DC | PRN

## 2016-05-30 MED ORDER — NITROGLYCERIN 1 MG/10 ML FOR IR/CATH LAB
INTRA_ARTERIAL | Status: AC
  Filled 2016-05-30: qty 10

## 2016-05-30 MED ORDER — MIDAZOLAM HCL 2 MG/2ML IJ SOLN
INTRAMUSCULAR | Status: DC | PRN
  Administered 2016-05-30 (×3): 1 mg via INTRAVENOUS
  Administered 2016-05-30 (×2): 2 mg via INTRAVENOUS

## 2016-05-30 MED ORDER — BIVALIRUDIN 250 MG IV SOLR
INTRAVENOUS | Status: AC
  Filled 2016-05-30: qty 250

## 2016-05-30 MED ORDER — ISOSORBIDE MONONITRATE ER 30 MG PO TB24
30.0000 mg | ORAL_TABLET | Freq: Every day | ORAL | Status: DC
  Administered 2016-05-31: 30 mg via ORAL
  Filled 2016-05-30: qty 1

## 2016-05-30 MED ORDER — ENSURE ENLIVE PO LIQD
237.0000 mL | Freq: Two times a day (BID) | ORAL | Status: DC
  Administered 2016-05-31: 237 mL via ORAL
  Filled 2016-05-30 (×4): qty 237

## 2016-05-30 MED ORDER — INSULIN ASPART 100 UNIT/ML ~~LOC~~ SOLN
0.0000 [IU] | Freq: Three times a day (TID) | SUBCUTANEOUS | Status: DC
  Administered 2016-05-31: 3 [IU] via SUBCUTANEOUS

## 2016-05-30 MED ORDER — SODIUM CHLORIDE 0.9 % WEIGHT BASED INFUSION: 1.0000 mL/kg/h | INTRAVENOUS | Status: DC

## 2016-05-30 MED ORDER — HEPARIN (PORCINE) IN NACL 2-0.9 UNIT/ML-% IJ SOLN
INTRAMUSCULAR | Status: DC | PRN
  Administered 2016-05-30: 1000 mL

## 2016-05-30 MED ORDER — FENTANYL CITRATE (PF) 100 MCG/2ML IJ SOLN
INTRAMUSCULAR | Status: DC | PRN
  Administered 2016-05-30 (×4): 50 ug via INTRAVENOUS

## 2016-05-30 MED ORDER — INSULIN GLARGINE 300 UNIT/ML ~~LOC~~ SOPN: 48.0000 [IU] | PEN_INJECTOR | Freq: Every day | SUBCUTANEOUS | Status: DC

## 2016-05-30 MED ORDER — INSULIN GLARGINE 100 UNIT/ML ~~LOC~~ SOLN
48.0000 [IU] | Freq: Every day | SUBCUTANEOUS | Status: DC
  Administered 2016-05-31: 10:00:00 48 [IU] via SUBCUTANEOUS
  Filled 2016-05-30: qty 0.48

## 2016-05-30 MED ORDER — ONDANSETRON HCL 4 MG/2ML IJ SOLN: 4.0000 mg | Freq: Four times a day (QID) | INTRAMUSCULAR | Status: DC | PRN

## 2016-05-30 MED ORDER — LIDOCAINE HCL (PF) 1 % IJ SOLN
INTRAMUSCULAR | Status: DC | PRN
  Administered 2016-05-30: 20 mL

## 2016-05-30 MED ORDER — SERTRALINE HCL 50 MG PO TABS
50.0000 mg | ORAL_TABLET | Freq: Every day | ORAL | Status: DC
  Administered 2016-05-30: 50 mg via ORAL
  Filled 2016-05-30 (×2): qty 1

## 2016-05-30 SURGICAL SUPPLY — 18 items
BALLN EUPHORA RX 2.0X12 (BALLOONS) ×3
BALLOON EUPHORA RX 2.0X12 (BALLOONS) ×1 IMPLANT
CATH INFINITI 5FR MULTPACK ANG (CATHETERS) ×3 IMPLANT
CATH LAUNCHER 6FR AL1 SH (CATHETERS) ×1 IMPLANT
CATHETER LAUNCHER 6FR AL1 SH (CATHETERS) ×3
KIT ENCORE 26 ADVANTAGE (KITS) ×3 IMPLANT
KIT HEART LEFT (KITS) ×3 IMPLANT
PACK CARDIAC CATHETERIZATION (CUSTOM PROCEDURE TRAY) ×3 IMPLANT
SHEATH PINNACLE 5F 10CM (SHEATH) ×3 IMPLANT
SHEATH PINNACLE 6F 10CM (SHEATH) ×3 IMPLANT
SYR MEDRAD MARK V 150ML (SYRINGE) ×3 IMPLANT
TRANSDUCER W/STOPCOCK (MISCELLANEOUS) ×3 IMPLANT
TUBING CIL FLEX 10 FLL-RA (TUBING) ×3 IMPLANT
VALVE GUARDIAN II ~~LOC~~ HEMO (MISCELLANEOUS) ×3 IMPLANT
WIRE ASAHI FIELDER XT 190CM (WIRE) ×3 IMPLANT
WIRE ASAHI PROWATER 180CM (WIRE) ×3 IMPLANT
WIRE EMERALD 3MM-J .035X150CM (WIRE) ×3 IMPLANT
WIRE EMERALD 3MM-J .035X260CM (WIRE) ×3 IMPLANT

## 2016-05-30 NOTE — Progress Notes (Signed)
Site area: right groin  Site Prior to Removal:  Level 0  Pressure Applied For 20 MINUTES    Minutes Beginning at 1430  Manual:   Yes.    Patient Status During Pull:  stable  Post Pull Groin Site:  Level 0  Post Pull Instructions Given:  Yes.    Post Pull Pulses Present:  Yes.    Dressing Applied:  Yes.    Comments:   

## 2016-05-30 NOTE — Progress Notes (Signed)
Dr Eldridge Dace notified of c/o 3/10 chest pain and no new orders noted
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
## 2016-05-30 NOTE — Progress Notes (Signed)
   05/30/16 1400  Clinical Encounter Type  Visited With Patient  Visit Type Initial;Social support  Spiritual Encounters  Spiritual Needs Emotional  Stress Factors  Patient Stress Factors Health changes  Family Stress Factors Not reviewed   Chaplain visited with pt. [name] knew pt and wanted to offer some encouragement while in the hospital. Chaplain provided ministry of presence and social support.  Rosezella [place] Chaney Ingram 05/30/2016 2:22 PM [phone]

## 2016-05-30 NOTE — Progress Notes (Signed)
C/O shortness of breath and refuses oxygen

## 2016-05-30 NOTE — Interval H&P Note (Signed)
Cath Lab Visit (complete for each Cath Lab visit)  Clinical Evaluation Leading to the Procedure:   ACS: Yes.    Non-ACS:    Anginal Classification: CCS IV  Anti-ischemic medical therapy: Maximal Therapy (2 or more classes of medications)  Non-Invasive Test Results: No non-invasive testing performed  Prior CABG: Previous CABG   Having rest pain in short stay.   History and Physical Interval Note:  05/30/2016 11:12 AM  [name]  has presented today for surgery, with the diagnosis of cp  The various methods of treatment have been discussed with the patient and family. After consideration of risks, benefits and other options for treatment, the patient has consented to  Procedure(s): Left Heart Cath and Cors/Grafts Angiography (N/A) as a surgical intervention .  The patient's history has been reviewed, patient examined, no change in status, stable for surgery.  I have reviewed the patient's chart and labs.  Questions were answered to the patient's satisfaction.     Lance Muss

## 2016-05-31 ENCOUNTER — Encounter (HOSPITAL_COMMUNITY): Payer: Self-pay | Admitting: Interventional Cardiology

## 2016-05-31 ENCOUNTER — Other Ambulatory Visit: Payer: Self-pay | Admitting: *Deleted

## 2016-05-31 DIAGNOSIS — I252 Old myocardial infarction: Secondary | ICD-10-CM | POA: Diagnosis not present

## 2016-05-31 DIAGNOSIS — E785 Hyperlipidemia, unspecified: Secondary | ICD-10-CM | POA: Diagnosis not present

## 2016-05-31 DIAGNOSIS — I25118 Atherosclerotic heart disease of native coronary artery with other forms of angina pectoris: Secondary | ICD-10-CM | POA: Diagnosis not present

## 2016-05-31 DIAGNOSIS — I208 Other forms of angina pectoris: Secondary | ICD-10-CM | POA: Diagnosis not present

## 2016-05-31 DIAGNOSIS — E1165 Type 2 diabetes mellitus with hyperglycemia: Secondary | ICD-10-CM | POA: Diagnosis not present

## 2016-05-31 DIAGNOSIS — I5022 Chronic systolic (congestive) heart failure: Secondary | ICD-10-CM | POA: Diagnosis not present

## 2016-05-31 DIAGNOSIS — I2582 Chronic total occlusion of coronary artery: Secondary | ICD-10-CM | POA: Diagnosis not present

## 2016-05-31 LAB — BASIC METABOLIC PANEL
Anion gap: 5 (ref 5–15)
BUN: 14 mg/dL (ref 6–20)
CALCIUM: 9 mg/dL (ref 8.9–10.3)
CO2: 28 mmol/L (ref 22–32)
CREATININE: 0.93 mg/dL (ref 0.61–1.24)
Chloride: 104 mmol/L (ref 101–111)
GFR calc Af Amer: >60 mL/min (ref >60)
GLUCOSE: 249 mg/dL — AB (ref 65–99)
POTASSIUM: 4.1 mmol/L (ref 3.5–5.1)
SODIUM: 137 mmol/L (ref 135–145)

## 2016-05-31 LAB — CBC
HEMATOCRIT: 41.7 % (ref 39.0–52.0)
Hemoglobin: 13.3 g/dL (ref 13.0–17.0)
MCH: 27.6 pg (ref 26.0–34.0)
MCHC: 31.9 g/dL (ref 30.0–36.0)
MCV: 86.5 fL (ref 78.0–100.0)
PLATELETS: 165 10*3/uL (ref 150–400)
RBC: 4.82 MIL/uL (ref 4.22–5.81)
RDW: 13.6 % (ref 11.5–15.5)
WBC: 7.8 10*3/uL (ref 4.0–10.5)

## 2016-05-31 LAB — GLUCOSE, CAPILLARY: GLUCOSE-CAPILLARY: 220 mg/dL — AB (ref 65–99)

## 2016-05-31 MED ORDER — ANGIOPLASTY BOOK
Freq: Once | Status: AC
  Administered 2016-05-31: 06:00:00
  Filled 2016-05-31: qty 1

## 2016-05-31 MED FILL — Heparin Sodium (Porcine) 2 Unit/ML in Sodium Chloride 0.9%: INTRAMUSCULAR | Qty: 500 | Status: AC

## 2016-05-31 NOTE — Care Management Note (Signed)
Case Management Note  Patient Details  Name: Takao Lizer MRN: [id number] Date of Birth: 11/19/1966     Action/Plan: Discharge Planning:   NCM spoke to pt and wife, Kendal Hymen at bedside. Pt states he is currently in TWILIGHT study for Brilinta and receives medication for free. Also receives his Toujeo for free in study. Will continue to follow for dc needs.   PCP- [name] MD   Expected Discharge Date: 05/31/2016   Expected Discharge Plan: Home/Self Care  In-House Referral: NA  Discharge planning Services CM Consult  Post Acute Care Choice: NA Choice offered to: NA  DME Arranged: N/A DME Agency: NA  HH Arranged: NA HH Agency: NA  Status of Service: Completed, signed off  Medicare Important Message Given:   Date Medicare IM Given:   Medicare IM give by:   Date Additional Medicare IM Given:   Additional Medicare Important Message give by:    If discussed at Long Length of Stay Meetings, dates discussed:   Additional Comments:  Elliot Cousin, RN 05/30/2016, 3:57 PM

## 2016-05-31 NOTE — Progress Notes (Signed)
CARDIAC REHAB PHASE I   PRE:  Rate/Rhythm: 68 SR with PACS/PVCs    BP: sitting 128/72    SaO2:   MODE:  Ambulation: 1000 ft   POST:  Rate/Rhythm: 86 SR with PACs/PVCs    BP: sitting 120/63     SaO2:   Pt sts he has constant 2/10 chest tightness, like a vague feeling. Also sts his SOB comes and goes. He was having no SOB in bed. During walking he had episodic SOB. He rated it 1/4 on SOB scale. Able to keep walking. Long discussion with pt of managing his SOB and CP. Encouraged him to try to exercise, increasing his walking as he is able to eventually 30-45 min daily. Highly encouraged CRPII and he is willing to check into it (previously did not do due to finances but I gave him some suggestions). Will resend referral to [facility]. Review NTG and diet. He is stress eating at home. Highly encouraged carb counting and My Fitness Pal app to assist him counting carbs. Reinforced importance of Brilinta. Pt sts he still sees a counselor and has good support groups to help him manage mentally accepting his heart disease. [phone]   Harriet Masson CES, ACSM 05/31/2016 9:13 AM

## 2016-05-31 NOTE — Patient Outreach (Signed)
Transition of care. Pt had another heart cath and attempted stenting which was not successful. He was released home earlier today. He is very disappointed and voices some general apathy. He reports he is taking his meds as ordered. He is taking his IMDUR routinely and prn NTG which does help when he takes it. We talked at length about his mental state. I have encouraged him to see his mental health provider for professional support and he shared that he will see them in the next week or two. He is taking his sertraline, lithium and trazadone. We talked about how it appears he will have to accept his new normal which is not what he had hoped and anticipated to feel like. He is grieving. I asked him to please take it easy this week and not go back to work until next week. His body needs to heal and he doesn't need to do anything that would cause any more complications.  I told him I will continue to call him weekly for awhile. He is happy for me to call and voices his appreciation.  [facility] Care Manager [phone]

## 2016-05-31 NOTE — Progress Notes (Signed)
This encounter was created in error - please disregard.
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
## 2016-05-31 NOTE — Discharge Summary (Signed)
Discharge Summary    Patient ID: Austin Arnold,  MRN: [id number], DOB/AGE: 1966-06-01 50 y.o.  Admit date: 05/30/2016 Discharge date: 05/31/2016  Primary Care Provider: [facility] Primary Cardiologist: Croitoru  Discharge Diagnoses    Principal Problem:   Angina at rest [facility]) Active Problems:   DM (diabetes mellitus), type 2, uncontrolled with complications (HCC)   Hyperlipidemia   Sleep apnea   Chronic systolic CHF    Allergies Allergies  Allergen Reactions  . Sulfa Antibiotics Other (See Comments)    Reaction unknown occurred during childhood    Diagnostic Studies/Procedures   Procedures    Coronary Balloon Angioplasty   Left Heart Cath and Cors/Grafts Angiography    Conclusion     Ost LAD to Mid LAD lesion, 100% stenosed. Patent LIMA to LAD.  Patent stents in the circumflex and OM1.  Subtotally occluded secondary branch of the ramus- small vessel. Unable to cross with wire.  Lat branch of Ramus lesion, 50% stenosed. The SVG to this branch is occluded.  Continue medical therapy. Will watch overnight since attempt at angioplasty was performed. I do not think his coronary anatomy would explain angina at rest. Other etiologies of chest pain should be investigated. He reports some pain at the bottom of his sternal incision as well.   Patient is in [place]. Aspirin/Brilinta phase of trial- Both meds were ordered.       History of Present Illness     Austin Arnold is a 50 y.o. male with recently diagnosed multivessel coronary artery disease who underwent coronary bypass surgery with Dr. Donata Clay roughly 4 months ago. He presented with a non-ST segment elevation myocardial infarction on January 5 and surgery was performed on 01/20/2016. He received 4 bypasses (LIMA to LAD, SVG to ramus, sequential SVG to OM and left circumflex PDA). Unfortunately he had early graft failure. The SVG to the ramus is completely occluded. The distal limb of  the sequential SVG is also occluded. There is a severe ostial stenosis of the SVG to the OM.  On June 2 he underwent complex percutaneous revascularization with Dr. Eldridge Dace. A single drug-eluting stent was placed in the subtotally occluded OM branch. The severe bifurcation stenosis in the left circumflex coronary artery was treated with 2 drug-eluting stents en culotte. All 3 stents are synergy drug-eluting devices.  Seen by Dr. Loney Hering 05/26/16 for ongoing unstable angina. He was presented 6/13 for PCI of ramus inter.   also bears a diagnosis of rheumatoid arthritis and is taking a low-dose of steroids (prednisone 5 mg twice daily). He has type 2 diabetes mellitus on metformin and insulin (Toujeo) and is taking high-dose rosuvastatin for hyperlipidemia. He has a history of bipolar disorder managed with a combination of lithium and trazodone. He recently underwent a sleep study that confirmed the diagnosis of moderate obstructive sleep apnea (AHI=15.6/h). There was also evidence of severe periodic limb movements during sleep. He is not yet wearing CPAP.  Hospital Course     Consultants: None  The patient presented for scheduled intervention of subtotally occluded secondary branch of the ramus- small vessel. However Dr. Eldridge Dace is unable to cross with wire. Continued medical therapy recommended. Patient is in [place]. The patient was observed overnight since attempt at angioplasty was performed. Discussed starting Ranexa but he said that he would not be able to afford it. No further chest pain.   The patient has been seen by Dr. Elease Hashimoto today and deemed ready for discharge home. All follow-up appointments have been  scheduled. Discharge medications are listed below.    No change to home medication. Consider adding ARB as outpatient. Hold metformin 48 hours post cath.   Discharge Vitals Blood pressure 119/70, pulse 63, temperature 98 F (36.7 C), temperature source Oral, resp. rate 19, height 6\' 2"   (1.88 m), weight 235 lb 14.3 oz (107 kg), SpO2 98 %.  Filed Weights   05/30/16 0817 05/31/16 0503  Weight: 240 lb (108.863 kg) 235 lb 14.3 oz (107 kg)    Labs & Radiologic Studies     CBC  Recent Labs  05/31/16 0505  WBC 7.8  HGB 13.3  HCT 41.7  MCV 86.5  PLT 165   Basic Metabolic Panel  Recent Labs  05/31/16 0505  NA 137  K 4.1  CL 104  CO2 28  GLUCOSE 249*  BUN 14  CREATININE 0.93  CALCIUM 9.0    Disposition   Pt is being discharged home today in good condition.  Follow-up Plans & Appointments    Follow-up Information    Follow up with Robbie Lis, PA-C. Go on 06/15/2016.   Specialties:  Cardiology, Radiology   Why:  @8 :00 am for post hospital   Contact information:   [address] [place] [address] [phone]      Discharge Instructions    Amb Referral to Cardiac Rehabilitation    Complete by:  As directed   Diagnosis:   PTCA Coronary Stents       Diet - low sodium heart healthy    Complete by:  As directed      Discharge instructions    Complete by:  As directed   No driving for 48 hours. No lifting over 5 lbs for 1 week. No sexual activity for 1 week. You may return to work on Monday 6/19. Keep procedure site clean & dry. If you notice increased pain, swelling, bleeding or pus, call/return!  You may shower, but no soaking baths/hot tubs/pools for 1 week.   Hold metformin today. Resume tomorrow 6/15 evening.     Increase activity slowly    Complete by:  As directed            Discharge Medications   Discharge Medication List as of 05/31/2016 11:13 AM    CONTINUE these medications which have NOT CHANGED   Details  !! AMBULATORY NON FORMULARY MEDICATION Take 90 mg by mouth 2 (two) times daily. Medication Name: BRILINTA 90 mg BID (TWILIGHT Research Study PROVIDED), Starting 05/19/2016, Until Discontinued, OTC    !! AMBULATORY NON FORMULARY MEDICATION Take 81 mg by mouth daily. Medication Name: ASA 81 mg Daily  (TWILIGHT Research Study PROVIDED), Starting 05/19/2016, Until Discontinued, OTC    Insulin Glargine (TOUJEO SOLOSTAR) 300 UNIT/ML SOPN Inject 48 Units into the skin daily. Reported on 04/27/2016, Starting 05/21/2016, Until Discontinued, Normal    isosorbide mononitrate (IMDUR) 30 MG 24 hr tablet Take 1 tablet (30 mg total) by mouth daily., Starting 05/26/2016, Until Discontinued, Normal    lithium 600 MG capsule Take 600 mg by mouth daily., Starting 12/02/2015, Until Discontinued, Historical Med    metFORMIN (GLUCOPHAGE-XR) 500 MG 24 hr tablet Take 1,000 mg by mouth 2 (two) times daily., Starting 12/22/2015, Until Discontinued, Historical Med    metoprolol tartrate (LOPRESSOR) 50 MG tablet Take 1 tablet (50 mg total) by mouth 2 (two) times daily., Starting 05/26/2016, Until Discontinued, Normal    nitroGLYCERIN (NITROSTAT) 0.4 MG SL tablet Place 1 tablet (0.4 mg total) under the tongue every 5 (  five) minutes x 3 doses as needed for chest pain., Starting 01/30/2016, Until Discontinued, Normal    predniSONE (DELTASONE) 10 MG tablet Take 5 mg by mouth 2 (two) times daily., Starting 04/17/2016, Until Discontinued, Historical Med    rosuvastatin (CRESTOR) 40 MG tablet Take 1 tablet (40 mg total) by mouth daily., Starting 03/01/2016, Until Discontinued, Normal    traZODone (DESYREL) 100 MG tablet Take 100 mg by mouth at bedtime. , Starting 12/16/2015, Until Discontinued, Historical Med    ZOLOFT 50 MG tablet Take 1 tablet (50 mg total) by mouth daily., Starting 02/29/2016, Until Discontinued, Fax     !! - Potential duplicate medications found. Please discuss with provider.        Outstanding Labs/Studies   None  Duration of Discharge Encounter   Greater than 30 minutes including physician time.  Lorelei Pont PA-C 05/31/2016, 3:04 PM   Attending Note:   The patient was seen and examined.  Agree with assessment and plan as noted above.  Changes made to the above note as  needed.  Patient seen and independently examined with Chelsea Aus, PA.   We discussed all aspects of the encounter. I agree with the assessment and plan as stated above.  Pt had a PCI attempt yesterday - unsuccessful Unfortunately , has had premature closure of SVGs  Continue with medical theray    I have spent a total of 40 minutes with patient reviewing hospital  notes , telemetry, EKGs, labs and examining patient as well as establishing an assessment and plan that was discussed with the patient. > 50% of time was spent in direct patient care.   Vesta Mixer, Montez Hageman., MD, [facility] 05/31/2016, 4:50 PM [address]  [address] Office [phone] Pager [phone]

## 2016-05-31 NOTE — Progress Notes (Signed)
   05/31/16 0900  Clinical Encounter Type  Visited With Patient  Visit Type Follow-up;Social support  Spiritual Encounters  Spiritual Needs Emotional  Stress Factors  Patient Stress Factors Health changes;Lack of knowledge;Loss of control  Family Stress Factors Health changes;Lack of knowledge   [name] visited with pt this morning. Pt is frustrated because he does not have closure or any answers to his condition.  Pt hopes that physician will stop by.  Chaplain offered social support and ministry of presence to pt.  Austin Arnold 05/31/2016 9:20 AM [phone]

## 2016-06-01 ENCOUNTER — Ambulatory Visit: Payer: Medicare Other | Admitting: Nurse Practitioner

## 2016-06-01 ENCOUNTER — Encounter: Payer: Self-pay | Admitting: Cardiovascular Disease

## 2016-06-06 ENCOUNTER — Ambulatory Visit: Payer: Self-pay | Admitting: *Deleted

## 2016-06-07 DIAGNOSIS — F3132 Bipolar disorder, current episode depressed, moderate: Secondary | ICD-10-CM | POA: Diagnosis not present

## 2016-06-08 ENCOUNTER — Encounter: Payer: Self-pay | Admitting: Cardiovascular Disease

## 2016-06-12 ENCOUNTER — Other Ambulatory Visit: Payer: Self-pay | Admitting: *Deleted

## 2016-06-12 NOTE — Patient Outreach (Signed)
Transition of care call. (Pt can only be contacted after 8 pm and this is making it difficult to do weekly calls). I called pt tonight and he is very agitated and angry. He is not satisfied with his CABG outcome and subsequent angioplasty results. He is still complaining of SOB and CP and says "this just isn't right, this shouldn't be. I'm not going to settle for this" He has tried to communicate with Dr. Rubie Maid but he is out of town. His nurse tried to respond to his questions but he is not satisfied with providers that are not MDs or that have not had any prior experience with his case. He says he is "fed up with [facility]" and that he has made an appt to be evaluated at [facility] but that isn't until August and he wants resolution now. He reports he has an appt with a PA next Thursday and he is afraid is may really loose his temper. He wants to have not only the PA but another witness in the room when he is seen. I told him I will personally pass this on to Ms. Simmons, the scheduled provider so she can review his chart and be prepared to hear his frustrations.  I will call pt again next week one day after 8 pm.  Almetta [facility] Care Manager [phone]

## 2016-06-15 ENCOUNTER — Encounter: Payer: Self-pay | Admitting: Cardiology

## 2016-06-15 ENCOUNTER — Ambulatory Visit (INDEPENDENT_AMBULATORY_CARE_PROVIDER_SITE_OTHER): Payer: Medicare Other | Admitting: Cardiology

## 2016-06-15 ENCOUNTER — Other Ambulatory Visit: Payer: Self-pay | Admitting: Cardiology

## 2016-06-15 ENCOUNTER — Ambulatory Visit
Admission: RE | Admit: 2016-06-15 | Discharge: 2016-06-15 | Disposition: A | Discharge status: Home / Self Care | Payer: Medicare Other | Source: Ambulatory Visit | Attending: Cardiology | Admitting: Cardiology

## 2016-06-15 VITALS — BP 118/62 | HR 68 | Ht 74.0 in | Wt 248.4 lb

## 2016-06-15 DIAGNOSIS — I2510 Atherosclerotic heart disease of native coronary artery without angina pectoris: Secondary | ICD-10-CM

## 2016-06-15 DIAGNOSIS — R0789 Other chest pain: Secondary | ICD-10-CM

## 2016-06-15 DIAGNOSIS — R0600 Dyspnea, unspecified: Secondary | ICD-10-CM

## 2016-06-15 DIAGNOSIS — R079 Chest pain, unspecified: Secondary | ICD-10-CM | POA: Diagnosis not present

## 2016-06-15 DIAGNOSIS — R0602 Shortness of breath: Secondary | ICD-10-CM | POA: Diagnosis not present

## 2016-06-15 MED ORDER — ISOSORBIDE MONONITRATE ER 30 MG PO TB24: 30.0000 mg | ORAL_TABLET | Freq: Two times a day (BID) | ORAL | Status: DC

## 2016-06-15 NOTE — Patient Instructions (Signed)
Medication Instructions:  1. INCREASE IMDUR TO 30 MG TWICE DAILY DAILY; NEW RX SENT IN  Labwork: NONE  Testing/Procedures: A chest x-ray takes a picture of the organs and structures inside the chest, including the heart, lungs, and blood vessels. This test can show several things, including, whether the heart is enlarges; whether fluid is building up in the lungs; and whether pacemaker / defibrillator leads are still in place. TODAY  AT [address] [place] [facility]   Follow-Up: DR. Royann Shivers IN THE [place] OFFICE IN 1-2 WEEKS OR WITH PA/NP SAME DAY DR. Royann Shivers IS IN THE OFFICE   Any Other Special Instructions Will Be Listed Below (If Applicable).     If you need a refill on your cardiac medications before your next appointment, please call your pharmacy.

## 2016-06-15 NOTE — Progress Notes (Signed)
06/15/2016 Austin Arnold   10-13-1966  [id number]  Primary Physician [name], MD Primary Cardiologist: Dr. Royann Shivers    Reason for Visit/CC: [facility] Procedural F/u; s/p LHC  HPI:  Austin Arnold is a 50 y.o. male with recently diagnosed multivessel coronary artery disease who underwent coronary bypass surgery with Dr. Donata Clay roughly 4 months ago. He presented with a non-ST segment elevation myocardial infarction on January 5 and surgery was performed on 01/20/2016. He received 4 bypasses (LIMA to LAD, SVG to ramus, sequential SVG to OM and left circumflex PDA). Unfortunately he had early graft failure. The SVG to the ramus is completely occluded. The distal limb of the sequential SVG is also occluded. There is a severe ostial stenosis of the SVG to the OM.  On June 2 he underwent complex percutaneous revascularization with Dr. Eldridge Dace. A single drug-eluting stent was placed in the subtotally occluded OM branch. The severe bifurcation stenosis in the left circumflex coronary artery was treated with 2 drug-eluting stents en culotte. All 3 stents are synergy drug-eluting devices.  He was seen by Dr. Royann Shivers on 05/26/16 and complained of recurrent CP. He was scheduled for repeat LHC, performed by Dr. Eldridge Dace on 05/30/16. Goal was to attempt PCI of the Ramus intermedius. LHC showed that his LIMA-LAD was patent. There were patent stents in the circumflex and OM1. There was subtotal occluded secondary branch of the ramus (small vessel). Dr. Eldridge Dace was unable to cross with wire.  PCI could not be performed. The lateral branch of the Ramus lesion was 50% stenosed. The SVG to this branch was occluded. EF was normal. It was felt that his coronary anatomy did not explain his resting angina. Continued medical therapy was recommended. He was continued on ASA + Brilinta (enrolled in the TWILIGHT Trial).   He also bears a diagnosis of rheumatoid arthritis and is taking a low-dose of  steroids (prednisone 5 mg twice daily). He has type 2 diabetes mellitus on metformin and insulin (Toujeo) and is taking high-dose rosuvastatin for hyperlipidemia. He has a history of bipolar disorder managed with a combination of lithium and trazodone. He recently underwent a sleep study that confirmed the diagnosis of moderate obstructive sleep apnea (AHI=15.6/h). There was also evidence of severe periodic limb movements during sleep. He is not yet wearing CPAP. He has had difficulties financially and has not be able to afford this, but he notes that he is working on getting a CPAP.   He now presents to clinic for post procedural f/u. He notes that he is very frustrated at the fact that he has had continued sternal chest pain since his CABG in February. The constant pain and no relief has been very stressing for him, leading to increased anxiety, frustration and depression.   Current Outpatient Prescriptions  Medication Sig Dispense Refill  . AMBULATORY NON FORMULARY MEDICATION Take 90 mg by mouth 2 (two) times daily. Medication Name: BRILINTA 90 mg BID (TWILIGHT Research Study PROVIDED)    . AMBULATORY NON FORMULARY MEDICATION Take 81 mg by mouth daily. Medication Name: ASA 81 mg Daily (TWILIGHT Research Study PROVIDED)    . Insulin Glargine (TOUJEO SOLOSTAR) 300 UNIT/ML SOPN Inject 48 Units into the skin daily. Reported on 04/27/2016 5 pen 0  . isosorbide mononitrate (IMDUR) 30 MG 24 hr tablet Take 1 tablet (30 mg total) by mouth 2 (two) times daily. 180 tablet 3  . lithium 600 MG capsule Take 600 mg by mouth daily.    . metFORMIN (GLUCOPHAGE-XR) 500  MG 24 hr tablet Take 1,000 mg by mouth 2 (two) times daily.    . metoprolol tartrate (LOPRESSOR) 50 MG tablet Take 1 tablet (50 mg total) by mouth 2 (two) times daily. 180 tablet 3  . nitroGLYCERIN (NITROSTAT) 0.4 MG SL tablet Place 1 tablet (0.4 mg total) under the tongue every 5 (five) minutes x 3 doses as needed for chest pain. 25 tablet 3  .  predniSONE (DELTASONE) 10 MG tablet Take 5 mg by mouth 2 (two) times daily.    . rosuvastatin (CRESTOR) 40 MG tablet Take 1 tablet (40 mg total) by mouth daily. 90 tablet 3  . traZODone (DESYREL) 100 MG tablet Take 100 mg by mouth at bedtime.     Marland Kitchen ZOLOFT 50 MG tablet Take 1 tablet (50 mg total) by mouth daily. 90 tablet 3   No current facility-administered medications for this visit.    Allergies  Allergen Reactions  . Sulfa Antibiotics Other (See Comments)    Reaction unknown occurred during childhood    Social History   Social History  . Marital Status: Married    Spouse Name: N/A  . Number of Children: N/A  . Years of Education: N/A   Occupational History  . Not on file.   Social History Main Topics  . Smoking status: Former Smoker -- 11 years    Types: Cigars    Quit date: 01/11/2015  . Smokeless tobacco: Never Used  . Alcohol Use: Yes     Comment: 05/19/2016 "stopped 01/27/2006"  . Drug Use: Yes    Special: Cocaine     Comment: 05/19/2016 "stopped in 01/27/2006"  . Sexual Activity: Yes   Other Topics Concern  . Not on file   Social History Narrative     Review of Systems: General: negative for chills, fever, night sweats or weight changes.  Cardiovascular: negative for chest pain, dyspnea on exertion, edema, orthopnea, palpitations, paroxysmal nocturnal dyspnea or shortness of breath Dermatological: negative for rash Respiratory: negative for cough or wheezing Urologic: negative for hematuria Abdominal: negative for nausea, vomiting, diarrhea, bright red blood per rectum, melena, or hematemesis Neurologic: negative for visual changes, syncope, or dizziness All other systems reviewed and are otherwise negative except as noted above.    Blood pressure 118/62, pulse 68, height 6\' 2"  (1.88 m), weight 248 lb 6.4 oz (112.674 kg).  General appearance: alert, cooperative, no distress and anxious/ frustrated  Neck: no carotid bruit and no JVD Lungs: clear to  auscultation bilaterally Heart: regular rate and rhythm, S1, S2 normal, no murmur, click, rub or gallop Extremities: no LEE Pulses: 2+ and symmetric Skin: warm and dry Neurologic: Grossly normal  EKG SR with PVCs  ASSESSMENT AND PLAN:   1. CAD: s/p CABG x 4 01/2016 (LIMA to LAD, SVG to ramus, sequential SVG to OM and left circumflex PDA).  Unfortunately, he has had premature graft failure. The SVG to the ramus is completely occluded. The distal limb of the sequential SVG is also occluded. There is a severe ostial stenosis of the SVG to the OM. His LIMA-LAD is patent. Recent attempt to perform PCI of his Ramus was unsuccessful. We will have to continue medical therapy. He continues to have CP. Will further tirate his Imdur, bump up to 60 mg but will do 30 mg BID to prevent significant hypotension. BP is stable but in the 110s systolic.   2. DM: on insulin. Per PCP.  3. HLD: on high dose statin therapy. Crestor, 40 mg daily.  4. OSA: recently diagnosed. Not currently on CPAP due to financial reasons, but he is trying to get one arranged.   5. Incisional/ Distal Sternal CP/ right lower rib pain + Dyspnea: will obtain a CXR to further evaluate.    PLAN  He is requesting a f/u with Dr. Royann Shivers ASAP. He is adamant that he see him in the near future.   Austin Lis PA-C 06/15/2016 1:51 PM

## 2016-06-21 ENCOUNTER — Encounter: Payer: Self-pay | Admitting: *Deleted

## 2016-06-21 ENCOUNTER — Telehealth: Payer: Self-pay | Admitting: *Deleted

## 2016-06-21 DIAGNOSIS — Z006 Encounter for examination for normal comparison and control in clinical research program: Secondary | ICD-10-CM

## 2016-06-21 DIAGNOSIS — F3132 Bipolar disorder, current episode depressed, moderate: Secondary | ICD-10-CM | POA: Diagnosis not present

## 2016-06-21 NOTE — Progress Notes (Signed)
TWILIGHT Research study 1 month telephone call completed. Patient states he has had "many issues, Shortness of breath, chest pain, dizziness". He denies any bleeding events. He also states he has been compliant with ASA and Brilinta. I explained to him that @ his next research appointment (08/14/16) he will be randomized to ASA or placebo. I encouraged him to talk about this with Dr. Royann Shivers tomorrow to make sure patient is still a good candidate for randomization, with the understanding that we still have a few months to evaluate his symptoms.    Questions encouraged and answered.

## 2016-06-21 NOTE — Telephone Encounter (Signed)
Left message for patient to call research office for 1 month TWILIGHT telephone follow up & to schedule 3 month Research Randomization appointment.

## 2016-06-22 ENCOUNTER — Encounter: Payer: Self-pay | Admitting: Cardiovascular Disease

## 2016-06-22 ENCOUNTER — Ambulatory Visit (INDEPENDENT_AMBULATORY_CARE_PROVIDER_SITE_OTHER): Payer: Medicare Other | Admitting: Cardiovascular Disease

## 2016-06-22 VITALS — BP 122/76 | HR 67 | Wt 249.0 lb

## 2016-06-22 DIAGNOSIS — I255 Ischemic cardiomyopathy: Secondary | ICD-10-CM

## 2016-06-22 DIAGNOSIS — I2510 Atherosclerotic heart disease of native coronary artery without angina pectoris: Secondary | ICD-10-CM

## 2016-06-22 DIAGNOSIS — Z794 Long term (current) use of insulin: Secondary | ICD-10-CM

## 2016-06-22 DIAGNOSIS — E118 Type 2 diabetes mellitus with unspecified complications: Secondary | ICD-10-CM

## 2016-06-22 DIAGNOSIS — IMO0002 Reserved for concepts with insufficient information to code with codable children: Secondary | ICD-10-CM

## 2016-06-22 DIAGNOSIS — E785 Hyperlipidemia, unspecified: Secondary | ICD-10-CM | POA: Diagnosis not present

## 2016-06-22 DIAGNOSIS — F319 Bipolar disorder, unspecified: Secondary | ICD-10-CM

## 2016-06-22 DIAGNOSIS — E1165 Type 2 diabetes mellitus with hyperglycemia: Secondary | ICD-10-CM
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
## 2016-06-22 NOTE — Patient Instructions (Signed)
Dr Royann Shivers has recommended making the following medication changes: 1. START Ranexa - take 1 (500 mg) tablet twice daily for 1 week then increase to 1 (1000 mg) tablet twice daily  Dr C will see you back in 3 weeks. An appointment has already been scheduled for you - 8/1/217 at 11:45a.  If you need a refill on your cardiac medications before your next appointment, please call your pharmacy.

## 2016-06-26 DIAGNOSIS — F3132 Bipolar disorder, current episode depressed, moderate: Secondary | ICD-10-CM | POA: Diagnosis not present

## 2016-06-27 ENCOUNTER — Encounter: Payer: Self-pay | Admitting: Cardiovascular Disease

## 2016-06-27 ENCOUNTER — Telehealth: Payer: Self-pay

## 2016-06-27 NOTE — Telephone Encounter (Signed)
Faxed patient assistance application to [employer] for patient's Toujeo insulin.

## 2016-07-05 DIAGNOSIS — F3132 Bipolar disorder, current episode depressed, moderate: Secondary | ICD-10-CM | POA: Diagnosis not present
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
## 2016-07-17 DIAGNOSIS — F3132 Bipolar disorder, current episode depressed, moderate: Secondary | ICD-10-CM | POA: Diagnosis not present

## 2016-07-18 ENCOUNTER — Encounter: Payer: Self-pay | Admitting: Cardiovascular Disease

## 2016-07-18 ENCOUNTER — Ambulatory Visit (INDEPENDENT_AMBULATORY_CARE_PROVIDER_SITE_OTHER): Payer: Medicare Other | Admitting: Cardiovascular Disease

## 2016-07-18 VITALS — BP 110/78 | HR 65 | Ht 74.0 in | Wt 250.0 lb

## 2016-07-18 DIAGNOSIS — I2510 Atherosclerotic heart disease of native coronary artery without angina pectoris: Secondary | ICD-10-CM | POA: Diagnosis not present

## 2016-07-18 DIAGNOSIS — F319 Bipolar disorder, unspecified: Secondary | ICD-10-CM

## 2016-07-18 DIAGNOSIS — Z794 Long term (current) use of insulin: Secondary | ICD-10-CM

## 2016-07-18 DIAGNOSIS — I255 Ischemic cardiomyopathy: Secondary | ICD-10-CM | POA: Diagnosis not present

## 2016-07-18 DIAGNOSIS — E118 Type 2 diabetes mellitus with unspecified complications: Secondary | ICD-10-CM

## 2016-07-18 DIAGNOSIS — IMO0002 Reserved for concepts with insufficient information to code with codable children: Secondary | ICD-10-CM

## 2016-07-18 DIAGNOSIS — I25110 Atherosclerotic heart disease of native coronary artery with unstable angina pectoris: Secondary | ICD-10-CM

## 2016-07-18 DIAGNOSIS — E785 Hyperlipidemia, unspecified: Secondary | ICD-10-CM | POA: Diagnosis not present

## 2016-07-18 DIAGNOSIS — E1165 Type 2 diabetes mellitus with hyperglycemia: Secondary | ICD-10-CM

## 2016-07-18 MED ORDER — RANOLAZINE ER 500 MG PO TB12: 500.0000 mg | ORAL_TABLET | Freq: Two times a day (BID) | ORAL | 0 refills | Status: DC

## 2016-07-18 NOTE — Patient Instructions (Addendum)
Dr Royann Shivers recommends that you schedule a follow-up appointment in 2 months.  If you need a refill on your cardiac medications before your next appointment, please call your pharmacy.   Medication samples have been provided to the patient. Drug name: Ranexa 500 mg Qty: 56 tabs LOT: [id number] Exp.Date: 01/2019

## 2016-07-19 ENCOUNTER — Ambulatory Visit (INDEPENDENT_AMBULATORY_CARE_PROVIDER_SITE_OTHER): Payer: Medicare Other | Admitting: Cardiothoracic Surgery

## 2016-07-19 ENCOUNTER — Other Ambulatory Visit: Payer: Self-pay | Admitting: *Deleted

## 2016-07-19 ENCOUNTER — Encounter: Payer: Self-pay | Admitting: Cardiothoracic Surgery

## 2016-07-19 VITALS — BP 116/71 | HR 64 | Resp 16 | Ht 74.0 in | Wt 250.0 lb

## 2016-07-19 DIAGNOSIS — R079 Chest pain, unspecified: Secondary | ICD-10-CM

## 2016-07-19 DIAGNOSIS — G8928 Other chronic postprocedural pain: Secondary | ICD-10-CM | POA: Diagnosis not present

## 2016-07-19 DIAGNOSIS — Z951 Presence of aortocoronary bypass graft: Secondary | ICD-10-CM

## 2016-07-19 DIAGNOSIS — I2510 Atherosclerotic heart disease of native coronary artery without angina pectoris: Secondary | ICD-10-CM
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
## 2016-07-20 ENCOUNTER — Telehealth: Payer: Self-pay | Admitting: Cardiovascular Disease

## 2016-07-20 NOTE — Telephone Encounter (Signed)
New message          The pt is down to his last insulin pen for Toujeo insulin, the pt is going be gone some today but please speak with wife.    Pt c/o medication issue:  1. Name of Medication: Toujeo  2. How are you currently taking this medication (dosage and times per day)? Not provided  3. Are you having a reaction (difficulty breathing--STAT)? no  4. What is your medication issue? The pt is on assist program, and the pt while in the hospital got a card to use at his pharmacy coupon was no good, and Marylene Land faxed in information on July 11th and the pt has not heard anything, the pt needs the insulin

## 2016-07-24 NOTE — Telephone Encounter (Signed)
Spoke with patient.   Notified him that I faxed paperwork to [facility] last week. Will hopefully here from them within the day.   Patient stated that Ranexa 500 mg BID is not helping his chest pain and would like to go back up to 1000 mg BID. Ranexa was recently decreased due to dizziness and nausea. Patient states "I'd rather be dizzy and nauseated than have chest pain." Reviewed with MCr - okay to increase Ranexa back to 1000 mg BID. Patient verbalized understanding. Samples provided. Drug name: Ranexa 1000 mg Qty: 28 tabs LOT: [id number] Exp.Date: 03/2019  Patient will be going for a second opinion and requested to have a copy of his 3 caths this year. Patient will come by to pick up the discs and reports tomorrow (07/25/2016).   Patient verbalized understanding and agreed with plan.

## 2016-07-24 NOTE — Telephone Encounter (Signed)
Austin Arnold is calling because he fill out some paper work to get his insulin , and is wanting to know the status of it . Please call

## 2016-07-24 NOTE — Telephone Encounter (Signed)
Returned call to Patient. He said he is wanting to know the status of his Toujeo insulin. He stated Chelley has been working on getting the paperwork filled out and he only has 2 injections left.  Will route to [employer].

## 2016-07-24 NOTE — Telephone Encounter (Signed)
F/u message ° °Pt returning Rn call. Please call back to discuss  °

## 2016-07-25 ENCOUNTER — Telehealth: Payer: Self-pay

## 2016-07-25 DIAGNOSIS — E7800 Pure hypercholesterolemia, unspecified: Secondary | ICD-10-CM | POA: Diagnosis not present

## 2016-07-25 DIAGNOSIS — I25119 Atherosclerotic heart disease of native coronary artery with unspecified angina pectoris: Secondary | ICD-10-CM | POA: Diagnosis not present

## 2016-07-25 DIAGNOSIS — E119 Type 2 diabetes mellitus without complications: Secondary | ICD-10-CM | POA: Diagnosis not present

## 2016-07-25 DIAGNOSIS — R072 Precordial pain: Secondary | ICD-10-CM | POA: Diagnosis not present

## 2016-07-25 DIAGNOSIS — Z955 Presence of coronary angioplasty implant and graft: Secondary | ICD-10-CM | POA: Diagnosis not present

## 2016-07-25 DIAGNOSIS — Z951 Presence of aortocoronary bypass graft: Secondary | ICD-10-CM | POA: Diagnosis not present

## 2016-07-25 DIAGNOSIS — Z794 Long term (current) use of insulin: Secondary | ICD-10-CM | POA: Diagnosis not present

## 2016-07-25 DIAGNOSIS — I444 Left anterior fascicular block: Secondary | ICD-10-CM | POA: Diagnosis not present

## 2016-07-25 DIAGNOSIS — I252 Old myocardial infarction: Secondary | ICD-10-CM | POA: Diagnosis not present

## 2016-08-01 NOTE — Telephone Encounter (Signed)
A representative from [facility] called to relate that when she calledl Mr. Austin Arnold to give him a time for an appointment at their clinic, he said he was not ready to come there now. He was getting a "second opinion" regarding his continued chest pain.

## 2016-08-03 DIAGNOSIS — F3132 Bipolar disorder, current episode depressed, moderate: Secondary | ICD-10-CM | POA: Diagnosis not present

## 2016-08-09 ENCOUNTER — Inpatient Hospital Stay: Admission: RE | Admit: 2016-08-09 | Payer: Medicare Other | Source: Ambulatory Visit

## 2016-08-09 ENCOUNTER — Encounter: Payer: Medicare Other | Admitting: Cardiothoracic Surgery

## 2016-08-09 DIAGNOSIS — F3132 Bipolar disorder, current episode depressed, moderate: Secondary | ICD-10-CM | POA: Diagnosis not present

## 2016-08-14 ENCOUNTER — Other Ambulatory Visit: Payer: Self-pay | Admitting: *Deleted

## 2016-08-14 ENCOUNTER — Encounter: Payer: Self-pay | Admitting: *Deleted

## 2016-08-14 DIAGNOSIS — Z006 Encounter for examination for normal comparison and control in clinical research program: Secondary | ICD-10-CM

## 2016-08-14 MED ORDER — AMBULATORY NON FORMULARY MEDICATION: 81.0000 mg | Freq: Every day | Status: DC

## 2016-08-14 NOTE — Progress Notes (Addendum)
Patient came to the research clinic for randomization into the TWILIGHT Research study.  No c/o of bleeding or shortness of breath associated with study.   Patient was randomized to ASA 81 mg daily or PLACEBO! (Protocol for the TWILIGHT Research study)

## 2016-08-15 ENCOUNTER — Encounter: Payer: Self-pay | Admitting: *Deleted

## 2016-08-15 ENCOUNTER — Other Ambulatory Visit: Payer: Self-pay | Admitting: *Deleted

## 2016-08-15 NOTE — Patient Outreach (Signed)
I was finally able to catch up with Mr. Austin Arnold after regular work hours this evening. He does not get home from work until 8:00 pm. He reports he is actually doing a little better. He is on a new cardiac medication, Renexa. This is making him feel better, however, he will run out of his supply on Sunday. He cannot afford this medication and the cardiology office is assisting him to apply for assistance. He is also entering into a drug study for Brilinta. He has sought out a second and third opinion about his cardiac surgery outcome and his present condition. He is going outside of the [facility] system for this.  I applauded pt for continuing to try to be compliant with his medical regimen and in being his own advocate to seek the answers he needs.  I have advised him I am closing his case. I have told him if he needs to talk or has issues in the future he is welcome to call me.  [facility] CM Care Plan Problem One   Flowsheet Row Most Recent Value  Care Plan for Problem One  Active  [facility] Long Term Goal (31-90 days)  Pt will call NP if any problems come up between transtion of care calls for the next 31 days.Margie Billet Long Term Goal Start Date  05/31/16  [facility] Long Term Goal Met Date  08/15/16  Interventions for Problem One Long Term Goal  Encoiuraged pt to follow his medical regimen.  [facility] CM Short Term Goal #1 (0-30 days)  Pt will see mental health professional within the next 2 weeks.  [facility]N CM Short Term Goal #1 Start Date  05/31/16  [facility] CM Short Term Goal #1 Met Date  08/15/16  [facility] CM Short Term Goal #2 (0-30 days)  Pt will schedule an appt to see Dr. Recardo Evangelist as he says he is not happy about seeing an associate by the time I talk with him next week.  [facility]N CM Short Term Goal #2 Start Date  05/31/16  [facility] CM Short Term Goal #2 Met Date  08/15/16      Deloria Lair [facility] [phone]

## 2016-08-16 DIAGNOSIS — F3132 Bipolar disorder, current episode depressed, moderate: Secondary | ICD-10-CM | POA: Diagnosis not present

## 2016-08-18 ENCOUNTER — Other Ambulatory Visit: Payer: Self-pay | Admitting: *Deleted

## 2016-08-18 MED ORDER — AMBULATORY NON FORMULARY MEDICATION: 81.0000 mg | Freq: Every day | Status: DC

## 2016-08-22 DIAGNOSIS — E7800 Pure hypercholesterolemia, unspecified: Secondary | ICD-10-CM | POA: Diagnosis not present

## 2016-08-22 DIAGNOSIS — R072 Precordial pain: Secondary | ICD-10-CM | POA: Diagnosis not present

## 2016-08-22 DIAGNOSIS — E119 Type 2 diabetes mellitus without complications: Secondary | ICD-10-CM | POA: Diagnosis not present

## 2016-08-22 DIAGNOSIS — I25119 Atherosclerotic heart disease of native coronary artery with unspecified angina pectoris: Secondary | ICD-10-CM | POA: Diagnosis not present

## 2016-08-22 DIAGNOSIS — Z794 Long term (current) use of insulin: Secondary | ICD-10-CM | POA: Diagnosis not present

## 2016-08-23 ENCOUNTER — Encounter: Payer: Self-pay | Admitting: Cardiovascular Disease

## 2016-08-28 DIAGNOSIS — I25119 Atherosclerotic heart disease of native coronary artery with unspecified angina pectoris: Secondary | ICD-10-CM | POA: Diagnosis not present

## 2016-08-28 DIAGNOSIS — R072 Precordial pain: Secondary | ICD-10-CM | POA: Diagnosis not present

## 2016-08-28 DIAGNOSIS — E7800 Pure hypercholesterolemia, unspecified: Secondary | ICD-10-CM | POA: Diagnosis not present

## 2016-08-28 DIAGNOSIS — Z955 Presence of coronary angioplasty implant and graft: Secondary | ICD-10-CM | POA: Diagnosis not present

## 2016-08-28 DIAGNOSIS — I2510 Atherosclerotic heart disease of native coronary artery without angina pectoris: Secondary | ICD-10-CM | POA: Diagnosis not present

## 2016-08-28 DIAGNOSIS — Z951 Presence of aortocoronary bypass graft: Secondary | ICD-10-CM | POA: Diagnosis not present

## 2016-08-28 DIAGNOSIS — Z794 Long term (current) use of insulin: Secondary | ICD-10-CM | POA: Diagnosis not present

## 2016-08-28 DIAGNOSIS — E119 Type 2 diabetes mellitus without complications: Secondary | ICD-10-CM | POA: Diagnosis not present

## 2016-08-30 DIAGNOSIS — F3132 Bipolar disorder, current episode depressed, moderate: Secondary | ICD-10-CM | POA: Diagnosis not present

## 2016-09-01 ENCOUNTER — Encounter: Payer: Self-pay | Admitting: Cardiovascular Disease

## 2016-09-04 ENCOUNTER — Telehealth: Payer: Self-pay | Admitting: *Deleted

## 2016-09-04 MED ORDER — IRBESARTAN 75 MG PO TABS: 75.0000 mg | ORAL_TABLET | Freq: Every day | ORAL | 3 refills | Status: DC

## 2016-09-04 NOTE — Telephone Encounter (Signed)
-----   Message from Mihai Croitoru, MD sent at 09/04/2016  1:28 PM EDT ----- Can you please call in irbesartan 75 mg for him? Thank you! MCr  Msg I sent to him via Mychart:  "The weaker than normal left ventricle does explain the shortness of breath, Austin Arnold. The metoprolol helps with that. We also plan to use medications called ACE inibitors or angiotensin receptor blockers to help a weak heart muscle recover. These medication lower BP and we were focusing on treating the angina (most of the meds for that also lower BP) so have not started them yet. Your BP is not particularly high. If you agree, plan to call in a prescription for irbesartan 75 mg once daily. THis also helps protect kidneys from damage from diabetes. It should cost about $10 a month (there are free coupons online at goodRx.com). We can then check your BP at follow-up. Dr. C" 

## 2016-09-04 NOTE — Telephone Encounter (Signed)
-----   Message from Thurmon Fair, MD sent at 09/04/2016  1:28 PM EDT ----- Can you please call in irbesartan 75 mg for him? Thank you! MCr  Msg I sent to him via Mychart:  "The weaker than normal left ventricle does explain the shortness of breath, Austin Arnold. The metoprolol helps with that. We also plan to use medications called ACE inibitors or angiotensin receptor blockers to help a weak heart muscle recover. These medication lower BP and we were focusing on treating the angina (most of the meds for that also lower BP) so have not started them yet. Your BP is not particularly high. If you agree, plan to call in a prescription for irbesartan 75 mg once daily. THis also helps protect kidneys from damage from diabetes. It should cost about $10 a month (there are free coupons online at [email or web address]). We can then check your BP at follow-up. Dr. Salena Saner"

## 2016-09-04 NOTE — Telephone Encounter (Signed)
Spoke to patient-- patient aware will send prescription to mail order- [facility]

## 2016-09-06 DIAGNOSIS — F3132 Bipolar disorder, current episode depressed, moderate: Secondary | ICD-10-CM | POA: Diagnosis not present

## 2016-09-07 DIAGNOSIS — Z79899 Other long term (current) drug therapy: Secondary | ICD-10-CM | POA: Diagnosis not present

## 2016-09-07 DIAGNOSIS — F3132 Bipolar disorder, current episode depressed, moderate: Secondary | ICD-10-CM | POA: Diagnosis not present

## 2016-09-11 ENCOUNTER — Telehealth: Payer: Self-pay | Admitting: *Deleted

## 2016-09-11 NOTE — Telephone Encounter (Signed)
Left message for patient to call research office for TWILIGHT research telephone follow up.

## 2016-09-13 DIAGNOSIS — F3132 Bipolar disorder, current episode depressed, moderate: Secondary | ICD-10-CM | POA: Diagnosis not present

## 2016-09-19 ENCOUNTER — Encounter: Payer: Self-pay | Admitting: *Deleted

## 2016-09-19 DIAGNOSIS — Z006 Encounter for examination for normal comparison and control in clinical research program: Secondary | ICD-10-CM

## 2016-09-19 NOTE — Progress Notes (Signed)
TWILIGHT Research study month 4 telephone visit completed. Patient denies any bleeding or other adverse events. Patient states he has been compliant with medication. He also states he needs some dental work soon and has a cardiology appointment soon. Encouraged patient to speak with Doctor with regards to Brilinta.

## 2016-09-21 ENCOUNTER — Encounter: Payer: Self-pay | Admitting: Cardiovascular Disease

## 2016-09-21 ENCOUNTER — Ambulatory Visit (INDEPENDENT_AMBULATORY_CARE_PROVIDER_SITE_OTHER): Payer: Medicare Other | Admitting: Cardiovascular Disease

## 2016-09-21 VITALS — BP 100/62 | HR 70 | Ht 74.0 in | Wt 249.0 lb

## 2016-09-21 DIAGNOSIS — I25708 Atherosclerosis of coronary artery bypass graft(s), unspecified, with other forms of angina pectoris: Secondary | ICD-10-CM

## 2016-09-21 DIAGNOSIS — I209 Angina pectoris, unspecified: Secondary | ICD-10-CM | POA: Diagnosis not present

## 2016-09-21 DIAGNOSIS — Z794 Long term (current) use of insulin: Secondary | ICD-10-CM

## 2016-09-21 DIAGNOSIS — I2510 Atherosclerotic heart disease of native coronary artery without angina pectoris: Secondary | ICD-10-CM

## 2016-09-21 DIAGNOSIS — E785 Hyperlipidemia, unspecified: Secondary | ICD-10-CM

## 2016-09-21 DIAGNOSIS — Z79899 Other long term (current) drug therapy: Secondary | ICD-10-CM

## 2016-09-21 DIAGNOSIS — E1165 Type 2 diabetes mellitus with hyperglycemia: Secondary | ICD-10-CM

## 2016-09-21 DIAGNOSIS — IMO0002 Reserved for concepts with insufficient information to code with codable children: Secondary | ICD-10-CM

## 2016-09-21 DIAGNOSIS — I255 Ischemic cardiomyopathy: Secondary | ICD-10-CM

## 2016-09-21 DIAGNOSIS — E118 Type 2 diabetes mellitus with unspecified complications: Secondary | ICD-10-CM

## 2016-09-21 NOTE — Patient Instructions (Addendum)
Medication Instructions: Dr Royann Shivers recommends that you continue on your current medications as directed. Please refer to the Current Medication list given to you today.  Labwork: Your physician recommends that you return for lab work at your earliest convenience - FASTING.  Testing/Procedures: NONE ORDERED  Follow-up: Dr Royann Shivers recommends that you schedule a follow-up appointment in 6 months. You will receive a reminder letter in the mail two months in advance. If you don't receive a letter, please call our office to schedule the follow-up appointment.  If you need a refill on your cardiac medications before your next appointment, please call your pharmacy.    Medication samples have been provided to the patient. Drug name: Ranexa 500 mg tabs Qty: 28 tabs LOT: [id number] Exp.Date: 01/2019 Drug name: Ranexa 500 mg tabs Qty: 28 tabs LOT: [id number] Exp.Date: 03/2019
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
## 2016-10-06 ENCOUNTER — Other Ambulatory Visit: Payer: Self-pay | Admitting: Physician Assistant

## 2016-10-06 NOTE — Telephone Encounter (Signed)
Review for refill. 

## 2016-10-06 NOTE — Telephone Encounter (Signed)
Rx(s) sent to pharmacy electronically.  

## 2016-10-10 ENCOUNTER — Telehealth: Payer: Self-pay | Admitting: Cardiovascular Disease

## 2016-10-10 ENCOUNTER — Encounter: Payer: Self-pay | Admitting: Cardiovascular Disease

## 2016-10-10 NOTE — Telephone Encounter (Signed)
Routed information to Dr Leonie Man OFFICE Attn : Morrie Sheldon

## 2016-10-10 NOTE — Telephone Encounter (Signed)
Request for surgical clearance:  What type of surgery is being performed?  Multiple Tooth Extractions   1. When is this surgery scheduled? 10/23/16   2. Are there any medications that need to be held prior to surgery and how long? Need to know if the Blood thinners need to held and if he needs pre-meds   Name of physician performing surgery?Dr. Yehuda Mao   3. What is your office phone and fax number? Fax#[phone], office #[phone]. 4.

## 2016-10-10 NOTE — Telephone Encounter (Signed)
FORWARD /DEFERRED TO DR MCNOBSJG

## 2016-10-10 NOTE — Telephone Encounter (Signed)
If the dental extractions can be performed while he take dual antiplatelet therapy, okay to go ahead now. If the dentist wants to stop the blood thinners the procedure should be delayed until after December 2. He does not need antibiotic premedication for endocarditis prophylaxis. Dr. Yehuda Mao does not have an Epic contact set up.

## 2016-10-11 ENCOUNTER — Encounter: Payer: Self-pay | Admitting: Cardiovascular Disease

## 2016-10-12 ENCOUNTER — Other Ambulatory Visit: Payer: Self-pay

## 2016-10-12 NOTE — Telephone Encounter (Signed)
Sent refill request for Toujeo insulin to [employer] patient assistance. Per MCr, patient will need to transition responsibility to PCP.

## 2016-10-19 ENCOUNTER — Encounter: Payer: Self-pay | Admitting: Cardiovascular Disease

## 2016-10-24 DIAGNOSIS — F3132 Bipolar disorder, current episode depressed, moderate: Secondary | ICD-10-CM | POA: Diagnosis not present

## 2016-10-31 DIAGNOSIS — F3132 Bipolar disorder, current episode depressed, moderate: Secondary | ICD-10-CM | POA: Diagnosis not present

## 2016-11-07 DIAGNOSIS — F3132 Bipolar disorder, current episode depressed, moderate: Secondary | ICD-10-CM | POA: Diagnosis not present

## 2016-11-14 DIAGNOSIS — I214 Non-ST elevation (NSTEMI) myocardial infarction: Secondary | ICD-10-CM | POA: Diagnosis not present

## 2016-11-14 DIAGNOSIS — L97911 Non-pressure chronic ulcer of unspecified part of right lower leg limited to breakdown of skin: Secondary | ICD-10-CM | POA: Diagnosis not present

## 2016-11-14 DIAGNOSIS — L97529 Non-pressure chronic ulcer of other part of left foot with unspecified severity: Secondary | ICD-10-CM | POA: Diagnosis not present

## 2016-11-14 DIAGNOSIS — E782 Mixed hyperlipidemia: Secondary | ICD-10-CM | POA: Diagnosis not present

## 2016-11-14 DIAGNOSIS — E11621 Type 2 diabetes mellitus with foot ulcer: Secondary | ICD-10-CM | POA: Diagnosis not present

## 2016-11-14 DIAGNOSIS — E1165 Type 2 diabetes mellitus with hyperglycemia: Secondary | ICD-10-CM | POA: Diagnosis not present

## 2016-11-14 DIAGNOSIS — L97519 Non-pressure chronic ulcer of other part of right foot with unspecified severity: Secondary | ICD-10-CM | POA: Diagnosis not present

## 2016-11-17 DIAGNOSIS — L97911 Non-pressure chronic ulcer of unspecified part of right lower leg limited to breakdown of skin: Secondary | ICD-10-CM | POA: Diagnosis not present

## 2016-11-17 DIAGNOSIS — L97519 Non-pressure chronic ulcer of other part of right foot with unspecified severity: Secondary | ICD-10-CM | POA: Diagnosis not present

## 2016-11-17 DIAGNOSIS — L97522 Non-pressure chronic ulcer of other part of left foot with fat layer exposed: Secondary | ICD-10-CM | POA: Diagnosis not present

## 2016-11-20 ENCOUNTER — Encounter (HOSPITAL_COMMUNITY): Payer: Self-pay | Admitting: *Deleted

## 2016-11-20 ENCOUNTER — Inpatient Hospital Stay (HOSPITAL_COMMUNITY)
Admission: EM | Admit: 2016-11-20 | Discharge: 2016-11-25 | DRG: 256 | Disposition: A | Discharge status: Home / Self Care | Payer: Medicare Other | Attending: Internal Medicine | Admitting: Internal Medicine

## 2016-11-20 ENCOUNTER — Emergency Department (HOSPITAL_COMMUNITY): Payer: Medicare Other

## 2016-11-20 DIAGNOSIS — E7800 Pure hypercholesterolemia, unspecified: Secondary | ICD-10-CM | POA: Diagnosis present

## 2016-11-20 DIAGNOSIS — I255 Ischemic cardiomyopathy: Secondary | ICD-10-CM | POA: Diagnosis present

## 2016-11-20 DIAGNOSIS — Z951 Presence of aortocoronary bypass graft: Secondary | ICD-10-CM

## 2016-11-20 DIAGNOSIS — E1142 Type 2 diabetes mellitus with diabetic polyneuropathy: Secondary | ICD-10-CM | POA: Diagnosis present

## 2016-11-20 DIAGNOSIS — L02612 Cutaneous abscess of left foot: Secondary | ICD-10-CM | POA: Diagnosis not present

## 2016-11-20 DIAGNOSIS — M86172 Other acute osteomyelitis, left ankle and foot: Secondary | ICD-10-CM | POA: Diagnosis not present

## 2016-11-20 DIAGNOSIS — M19072 Primary osteoarthritis, left ankle and foot: Secondary | ICD-10-CM | POA: Diagnosis present

## 2016-11-20 DIAGNOSIS — R0789 Other chest pain: Secondary | ICD-10-CM | POA: Diagnosis not present

## 2016-11-20 DIAGNOSIS — I25119 Atherosclerotic heart disease of native coronary artery with unspecified angina pectoris: Secondary | ICD-10-CM | POA: Diagnosis present

## 2016-11-20 DIAGNOSIS — E1165 Type 2 diabetes mellitus with hyperglycemia: Secondary | ICD-10-CM | POA: Diagnosis present

## 2016-11-20 DIAGNOSIS — Z87891 Personal history of nicotine dependence: Secondary | ICD-10-CM

## 2016-11-20 DIAGNOSIS — D62 Acute posthemorrhagic anemia: Secondary | ICD-10-CM | POA: Diagnosis not present

## 2016-11-20 DIAGNOSIS — E1169 Type 2 diabetes mellitus with other specified complication: Secondary | ICD-10-CM | POA: Diagnosis present

## 2016-11-20 DIAGNOSIS — M868X9 Other osteomyelitis, unspecified sites: Secondary | ICD-10-CM | POA: Diagnosis present

## 2016-11-20 DIAGNOSIS — E875 Hyperkalemia: Secondary | ICD-10-CM | POA: Diagnosis not present

## 2016-11-20 DIAGNOSIS — IMO0002 Reserved for concepts with insufficient information to code with codable children: Secondary | ICD-10-CM

## 2016-11-20 DIAGNOSIS — Z89412 Acquired absence of left great toe: Secondary | ICD-10-CM | POA: Diagnosis not present

## 2016-11-20 DIAGNOSIS — K219 Gastro-esophageal reflux disease without esophagitis: Secondary | ICD-10-CM | POA: Diagnosis present

## 2016-11-20 DIAGNOSIS — I110 Hypertensive heart disease with heart failure: Secondary | ICD-10-CM | POA: Diagnosis present

## 2016-11-20 DIAGNOSIS — E118 Type 2 diabetes mellitus with unspecified complications: Secondary | ICD-10-CM

## 2016-11-20 DIAGNOSIS — G4733 Obstructive sleep apnea (adult) (pediatric): Secondary | ICD-10-CM | POA: Diagnosis present

## 2016-11-20 DIAGNOSIS — M869 Osteomyelitis, unspecified: Secondary | ICD-10-CM

## 2016-11-20 DIAGNOSIS — M069 Rheumatoid arthritis, unspecified: Secondary | ICD-10-CM | POA: Diagnosis not present

## 2016-11-20 DIAGNOSIS — F1410 Cocaine abuse, uncomplicated: Secondary | ICD-10-CM | POA: Diagnosis present

## 2016-11-20 DIAGNOSIS — I5032 Chronic diastolic (congestive) heart failure: Secondary | ICD-10-CM | POA: Diagnosis present

## 2016-11-20 DIAGNOSIS — R739 Hyperglycemia, unspecified: Secondary | ICD-10-CM

## 2016-11-20 DIAGNOSIS — R918 Other nonspecific abnormal finding of lung field: Secondary | ICD-10-CM | POA: Diagnosis not present

## 2016-11-20 DIAGNOSIS — I252 Old myocardial infarction: Secondary | ICD-10-CM | POA: Diagnosis not present

## 2016-11-20 DIAGNOSIS — E785 Hyperlipidemia, unspecified: Secondary | ICD-10-CM | POA: Diagnosis present

## 2016-11-20 DIAGNOSIS — R0602 Shortness of breath: Secondary | ICD-10-CM | POA: Diagnosis not present

## 2016-11-20 DIAGNOSIS — Z882 Allergy status to sulfonamides status: Secondary | ICD-10-CM | POA: Diagnosis not present

## 2016-11-20 DIAGNOSIS — M79675 Pain in left toe(s): Secondary | ICD-10-CM

## 2016-11-20 DIAGNOSIS — I96 Gangrene, not elsewhere classified: Secondary | ICD-10-CM | POA: Diagnosis not present

## 2016-11-20 DIAGNOSIS — E11621 Type 2 diabetes mellitus with foot ulcer: Secondary | ICD-10-CM | POA: Diagnosis present

## 2016-11-20 DIAGNOSIS — I214 Non-ST elevation (NSTEMI) myocardial infarction: Secondary | ICD-10-CM

## 2016-11-20 DIAGNOSIS — I517 Cardiomegaly: Secondary | ICD-10-CM | POA: Diagnosis not present

## 2016-11-20 DIAGNOSIS — Z794 Long term (current) use of insulin: Secondary | ICD-10-CM

## 2016-11-20 DIAGNOSIS — M109 Gout, unspecified: Secondary | ICD-10-CM | POA: Diagnosis present

## 2016-11-20 DIAGNOSIS — F319 Bipolar disorder, unspecified: Secondary | ICD-10-CM | POA: Diagnosis not present

## 2016-11-20 DIAGNOSIS — I10 Essential (primary) hypertension: Secondary | ICD-10-CM | POA: Diagnosis not present

## 2016-11-20 DIAGNOSIS — L97519 Non-pressure chronic ulcer of other part of right foot with unspecified severity: Secondary | ICD-10-CM | POA: Diagnosis present

## 2016-11-20 DIAGNOSIS — R7881 Bacteremia: Secondary | ICD-10-CM | POA: Diagnosis present

## 2016-11-20 DIAGNOSIS — Z955 Presence of coronary angioplasty implant and graft: Secondary | ICD-10-CM

## 2016-11-20 DIAGNOSIS — R079 Chest pain, unspecified: Secondary | ICD-10-CM

## 2016-11-20 DIAGNOSIS — L97529 Non-pressure chronic ulcer of other part of left foot with unspecified severity: Secondary | ICD-10-CM | POA: Diagnosis present

## 2016-11-20 DIAGNOSIS — Z7952 Long term (current) use of systemic steroids: Secondary | ICD-10-CM

## 2016-11-20 DIAGNOSIS — B955 Unspecified streptococcus as the cause of diseases classified elsewhere: Secondary | ICD-10-CM | POA: Diagnosis present

## 2016-11-20 DIAGNOSIS — E1152 Type 2 diabetes mellitus with diabetic peripheral angiopathy with gangrene: Principal | ICD-10-CM | POA: Diagnosis present

## 2016-11-20 DIAGNOSIS — I739 Peripheral vascular disease, unspecified: Secondary | ICD-10-CM | POA: Diagnosis not present

## 2016-11-20 DIAGNOSIS — B999 Unspecified infectious disease: Secondary | ICD-10-CM

## 2016-11-20 DIAGNOSIS — Z809 Family history of malignant neoplasm, unspecified: Secondary | ICD-10-CM

## 2016-11-20 DIAGNOSIS — R0600 Dyspnea, unspecified: Secondary | ICD-10-CM | POA: Diagnosis present

## 2016-11-20 DIAGNOSIS — Z8249 Family history of ischemic heart disease and other diseases of the circulatory system: Secondary | ICD-10-CM

## 2016-11-20 DIAGNOSIS — E782 Mixed hyperlipidemia: Secondary | ICD-10-CM | POA: Diagnosis not present

## 2016-11-20 LAB — GLUCOSE, CAPILLARY
GLUCOSE-CAPILLARY: 186 mg/dL — AB (ref 65–99)
Glucose-Capillary: 276 mg/dL — ABNORMAL HIGH (ref 65–99)
Glucose-Capillary: 263 mg/dL — ABNORMAL HIGH (ref 65–99)

## 2016-11-20 LAB — CBC
HCT: 42.5 % (ref 39.0–52.0)
HEMOGLOBIN: 14.2 g/dL (ref 13.0–17.0)
MCH: 29.2 pg (ref 26.0–34.0)
MCHC: 33.4 g/dL (ref 30.0–36.0)
MCV: 87.4 fL (ref 78.0–100.0)
PLATELETS: 211 10*3/uL (ref 150–400)
RBC: 4.86 MIL/uL (ref 4.22–5.81)
RDW: 12.3 % (ref 11.5–15.5)
WBC: 10.2 10*3/uL (ref 4.0–10.5)

## 2016-11-20 LAB — LACTIC ACID, PLASMA
LACTIC ACID, VENOUS: 0.9 mmol/L (ref 0.5–1.9)
Lactic Acid, Venous: 1.2 mmol/L (ref 0.5–1.9)

## 2016-11-20 LAB — COMPREHENSIVE METABOLIC PANEL
ALBUMIN: 3.2 g/dL — AB (ref 3.5–5.0)
ALT: 12 U/L — ABNORMAL LOW (ref 17–63)
ANION GAP: 9 (ref 5–15)
AST: 19 U/L (ref 15–41)
Alkaline Phosphatase: 75 U/L (ref 38–126)
BUN: 13 mg/dL (ref 6–20)
CHLORIDE: 101 mmol/L (ref 101–111)
CO2: 25 mmol/L (ref 22–32)
Calcium: 9.6 mg/dL (ref 8.9–10.3)
Creatinine, Ser: 1.06 mg/dL (ref 0.61–1.24)
GFR calc Af Amer: >60 mL/min (ref >60)
Glucose, Bld: 273 mg/dL — ABNORMAL HIGH (ref 65–99)
POTASSIUM: 5.7 mmol/L — AB (ref 3.5–5.1)
Sodium: 135 mmol/L (ref 135–145)
Total Bilirubin: 1 mg/dL (ref 0.3–1.2)
Total Protein: 7.3 g/dL (ref 6.5–8.1)

## 2016-11-20 LAB — I-STAT TROPONIN, ED: TROPONIN I, POC: 0 ng/mL (ref 0.00–0.08)

## 2016-11-20 LAB — BRAIN NATRIURETIC PEPTIDE: B NATRIURETIC PEPTIDE 5: 104.9 pg/mL — AB (ref 0.0–100.0)

## 2016-11-20 MED ORDER — HYDROCODONE-ACETAMINOPHEN 5-325 MG PO TABS
1.0000 | ORAL_TABLET | ORAL | Status: DC | PRN
  Administered 2016-11-20 – 2016-11-25 (×19): 2 via ORAL
  Filled 2016-11-20 (×19): qty 2

## 2016-11-20 MED ORDER — PIPERACILLIN-TAZOBACTAM 3.375 G IVPB
3.3750 g | Freq: Three times a day (TID) | INTRAVENOUS | Status: DC
  Administered 2016-11-21 (×2): 3.375 g via INTRAVENOUS
  Filled 2016-11-20 (×6): qty 50

## 2016-11-20 MED ORDER — MAGNESIUM CITRATE PO SOLN: 1.0000 | Freq: Once | ORAL | Status: DC | PRN

## 2016-11-20 MED ORDER — TRAZODONE HCL 100 MG PO TABS
100.0000 mg | ORAL_TABLET | Freq: Every day | ORAL | Status: DC
  Administered 2016-11-20 – 2016-11-24 (×5): 100 mg via ORAL
  Filled 2016-11-20 (×5): qty 1

## 2016-11-20 MED ORDER — INSULIN ASPART 100 UNIT/ML ~~LOC~~ SOLN
0.0000 [IU] | Freq: Three times a day (TID) | SUBCUTANEOUS | Status: DC
  Administered 2016-11-20: 5 [IU] via SUBCUTANEOUS
  Administered 2016-11-21: 3 [IU] via SUBCUTANEOUS
  Administered 2016-11-21: 5 [IU] via SUBCUTANEOUS
  Administered 2016-11-21: 2 [IU] via SUBCUTANEOUS
  Administered 2016-11-22: 3 [IU] via SUBCUTANEOUS

## 2016-11-20 MED ORDER — MORPHINE SULFATE (PF) 4 MG/ML IV SOLN
4.0000 mg | Freq: Once | INTRAVENOUS | Status: AC
  Administered 2016-11-20: 4 mg via INTRAVENOUS
  Filled 2016-11-20: qty 1

## 2016-11-20 MED ORDER — ACETAMINOPHEN 325 MG PO TABS
650.0000 mg | ORAL_TABLET | Freq: Four times a day (QID) | ORAL | Status: DC | PRN
  Filled 2016-11-20: qty 2

## 2016-11-20 MED ORDER — NITROGLYCERIN 0.4 MG SL SUBL: 0.4000 mg | SUBLINGUAL_TABLET | SUBLINGUAL | Status: DC | PRN

## 2016-11-20 MED ORDER — FUROSEMIDE 10 MG/ML IJ SOLN
40.0000 mg | Freq: Once | INTRAMUSCULAR | Status: AC
  Administered 2016-11-20: 40 mg via INTRAVENOUS
  Filled 2016-11-20: qty 4

## 2016-11-20 MED ORDER — SERTRALINE HCL 100 MG PO TABS
100.0000 mg | ORAL_TABLET | Freq: Every day | ORAL | Status: DC
  Administered 2016-11-20 – 2016-11-23 (×2): 100 mg via ORAL
  Filled 2016-11-20 (×5): qty 1

## 2016-11-20 MED ORDER — TRAZODONE HCL 50 MG PO TABS
25.0000 mg | ORAL_TABLET | Freq: Every evening | ORAL | Status: DC | PRN
  Filled 2016-11-20: qty 1

## 2016-11-20 MED ORDER — VANCOMYCIN HCL 10 G IV SOLR
1750.0000 mg | Freq: Once | INTRAVENOUS | Status: AC
  Administered 2016-11-20: 1750 mg via INTRAVENOUS
  Filled 2016-11-20: qty 1750

## 2016-11-20 MED ORDER — IRBESARTAN 75 MG PO TABS
75.0000 mg | ORAL_TABLET | Freq: Every day | ORAL | Status: DC
  Administered 2016-11-21 – 2016-11-25 (×5): 75 mg via ORAL
  Filled 2016-11-20 (×6): qty 1

## 2016-11-20 MED ORDER — PREDNISONE 5 MG PO TABS
5.0000 mg | ORAL_TABLET | Freq: Two times a day (BID) | ORAL | Status: DC
  Administered 2016-11-20 – 2016-11-25 (×11): 5 mg via ORAL
  Filled 2016-11-20 (×11): qty 1

## 2016-11-20 MED ORDER — SODIUM CHLORIDE 0.9 % IV SOLN
INTRAVENOUS | Status: DC
  Administered 2016-11-20 – 2016-11-23 (×4): via INTRAVENOUS

## 2016-11-20 MED ORDER — SODIUM CHLORIDE 0.9% FLUSH
3.0000 mL | Freq: Two times a day (BID) | INTRAVENOUS | Status: DC
  Administered 2016-11-20 – 2016-11-25 (×5): 3 mL via INTRAVENOUS

## 2016-11-20 MED ORDER — ISOSORBIDE MONONITRATE ER 30 MG PO TB24
30.0000 mg | ORAL_TABLET | Freq: Two times a day (BID) | ORAL | Status: DC
  Administered 2016-11-20 – 2016-11-25 (×11): 30 mg via ORAL
  Filled 2016-11-20 (×11): qty 1

## 2016-11-20 MED ORDER — SENNOSIDES-DOCUSATE SODIUM 8.6-50 MG PO TABS: 1.0000 | ORAL_TABLET | Freq: Every evening | ORAL | Status: DC | PRN

## 2016-11-20 MED ORDER — RANOLAZINE ER 500 MG PO TB12
500.0000 mg | ORAL_TABLET | Freq: Two times a day (BID) | ORAL | Status: DC
  Administered 2016-11-20 – 2016-11-25 (×10): 500 mg via ORAL
  Filled 2016-11-20 (×11): qty 1

## 2016-11-20 MED ORDER — PIPERACILLIN-TAZOBACTAM 3.375 G IVPB 30 MIN
3.3750 g | Freq: Once | INTRAVENOUS | Status: AC
  Administered 2016-11-20: 3.375 g via INTRAVENOUS

## 2016-11-20 MED ORDER — LITHIUM CARBONATE 300 MG PO CAPS
600.0000 mg | ORAL_CAPSULE | Freq: Every day | ORAL | Status: DC
  Administered 2016-11-21 – 2016-11-25 (×5): 600 mg via ORAL
  Filled 2016-11-20 (×6): qty 2

## 2016-11-20 MED ORDER — ROSUVASTATIN CALCIUM 40 MG PO TABS
40.0000 mg | ORAL_TABLET | Freq: Every day | ORAL | Status: DC
  Administered 2016-11-20 – 2016-11-25 (×6): 40 mg via ORAL
  Filled 2016-11-20 (×7): qty 1

## 2016-11-20 MED ORDER — SODIUM CHLORIDE 0.9 % IV BOLUS (SEPSIS): 500.0000 mL | Freq: Once | INTRAVENOUS | Status: AC

## 2016-11-20 MED ORDER — VANCOMYCIN HCL IN DEXTROSE 1-5 GM/200ML-% IV SOLN: 1000.0000 mg | Freq: Two times a day (BID) | INTRAVENOUS | Status: DC

## 2016-11-20 MED ORDER — ONDANSETRON HCL 4 MG/2ML IJ SOLN: 4.0000 mg | Freq: Four times a day (QID) | INTRAMUSCULAR | Status: DC | PRN

## 2016-11-20 MED ORDER — ONDANSETRON HCL 4 MG PO TABS: 4.0000 mg | ORAL_TABLET | Freq: Four times a day (QID) | ORAL | Status: DC | PRN

## 2016-11-20 MED ORDER — METOPROLOL TARTRATE 50 MG PO TABS
50.0000 mg | ORAL_TABLET | Freq: Two times a day (BID) | ORAL | Status: DC
  Administered 2016-11-20 – 2016-11-25 (×11): 50 mg via ORAL
  Filled 2016-11-20 (×11): qty 1

## 2016-11-20 MED ORDER — ENOXAPARIN SODIUM 60 MG/0.6ML ~~LOC~~ SOLN
50.0000 mg | SUBCUTANEOUS | Status: DC
  Administered 2016-11-20 – 2016-11-21 (×2): 50 mg via SUBCUTANEOUS
  Filled 2016-11-20 (×2): qty 0.6

## 2016-11-20 MED ORDER — BISACODYL 10 MG RE SUPP: 10.0000 mg | Freq: Every day | RECTAL | Status: DC | PRN

## 2016-11-20 MED ORDER — VANCOMYCIN HCL IN DEXTROSE 1-5 GM/200ML-% IV SOLN
1000.0000 mg | Freq: Two times a day (BID) | INTRAVENOUS | Status: DC
Start: 2016-11-21 — End: 2016-11-21
  Administered 2016-11-21: 1000 mg via INTRAVENOUS
  Filled 2016-11-20 (×3): qty 200

## 2016-11-20 MED ORDER — ACETAMINOPHEN 650 MG RE SUPP: 650.0000 mg | Freq: Four times a day (QID) | RECTAL | Status: DC | PRN

## 2016-11-20 MED ORDER — LITHIUM CARBONATE 300 MG PO CAPS: 600.0000 mg | ORAL_CAPSULE | Freq: Every day | ORAL | Status: DC

## 2016-11-20 NOTE — Consult Note (Signed)
WOC Nurse has reviewed record and this patient has a positive xray or MRI for osteomyelitis, this is considered outside of the scope of practice for the WOC nurse, for that reason WOC Nurse will not consult.   Re-consult if only topical wound care needed after orthopedic or surgical evaluation Lacorey Brusca RN, CWOCN [phone]  

## 2016-11-20 NOTE — ED Triage Notes (Signed)
Pt states L great toe infection the is being tx with improvement though taking antibiotics.  Also c/o increasing sob and chest pain x 2 weeks.  Came in today b/c toe pain was increasing.

## 2016-11-20 NOTE — ED Notes (Signed)
Notified Lab to do add on BNP

## 2016-11-20 NOTE — ED Notes (Signed)
Placed patient into a gown and on the monitor waiting for provider 
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
## 2016-11-20 NOTE — Progress Notes (Addendum)
Pharmacy Antibiotic Note  Austin Arnold is a 50 y.o. male admitted on 11/20/2016 with osteomyelitis.  Pharmacy has been consulted for vancomycin dosing.  Pt received vancomycin 1750mg  and zosyn 3.375g IV once in the ED.  Plan: Vancomycin 1g IV every 12 hours.  Goal trough 15-20 mcg/mL. Zosyn 3.375g IV q8h (4 hour infusion).  Monitor culture data, renal function and clinical course VT at [facility] prn  Height: 6\' 2"  (188 cm) Weight: 232 lb (105.2 kg) IBW/kg (Calculated) : 82.2  Temp (24hrs), Avg:98.2 F (36.8 C), Min:98.2 F (36.8 C), Max:98.2 F (36.8 C)   Recent Labs Lab 11/20/16 1026  WBC 10.2  CREATININE 1.06    Estimated Creatinine Clearance: 106.6 mL/min (by C-G formula based on SCr of 1.06 mg/dL).    Allergies  Allergen Reactions  . Sulfa Antibiotics Other (See Comments)    Reaction unknown occurred during childhood    Antimicrobials this admission: Vanc 12/4 >>  Zosyn 12/4 >>   Dose adjustments this admission: n/a  Microbiology results:  BCx:   UCx:    Sputum:    MRSA PCR:    14/4. 14/4, PharmD, BCPS Clinical Pharmacist Pager [phone] 11/20/2016 12:39 PM
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
## 2016-11-21 ENCOUNTER — Inpatient Hospital Stay (HOSPITAL_COMMUNITY): Payer: Medicare Other

## 2016-11-21 DIAGNOSIS — E11621 Type 2 diabetes mellitus with foot ulcer: Secondary | ICD-10-CM

## 2016-11-21 DIAGNOSIS — I96 Gangrene, not elsewhere classified: Secondary | ICD-10-CM

## 2016-11-21 DIAGNOSIS — I517 Cardiomegaly: Secondary | ICD-10-CM

## 2016-11-21 LAB — BLOOD CULTURE ID PANEL (REFLEXED)
Acinetobacter baumannii: NOT DETECTED
CANDIDA PARAPSILOSIS: NOT DETECTED
Candida albicans: NOT DETECTED
Candida glabrata: NOT DETECTED
Candida krusei: NOT DETECTED
Candida tropicalis: NOT DETECTED
ENTEROCOCCUS SPECIES: NOT DETECTED
Enterobacter cloacae complex: NOT DETECTED
Enterobacteriaceae species: NOT DETECTED
Escherichia coli: NOT DETECTED
HAEMOPHILUS INFLUENZAE: NOT DETECTED
Klebsiella oxytoca: NOT DETECTED
Klebsiella pneumoniae: NOT DETECTED
LISTERIA MONOCYTOGENES: NOT DETECTED
Neisseria meningitidis: NOT DETECTED
PSEUDOMONAS AERUGINOSA: NOT DETECTED
Proteus species: NOT DETECTED
SERRATIA MARCESCENS: NOT DETECTED
STAPHYLOCOCCUS AUREUS BCID: NOT DETECTED
STREPTOCOCCUS PYOGENES: NOT DETECTED
STREPTOCOCCUS SPECIES: DETECTED — AB
Staphylococcus species: NOT DETECTED
Streptococcus agalactiae: DETECTED — AB
Streptococcus pneumoniae: NOT DETECTED

## 2016-11-21 LAB — COMPREHENSIVE METABOLIC PANEL
ALBUMIN: 2.6 g/dL — AB (ref 3.5–5.0)
ALK PHOS: 63 U/L (ref 38–126)
ALT: 12 U/L — ABNORMAL LOW (ref 17–63)
AST: 15 U/L (ref 15–41)
Anion gap: 8 (ref 5–15)
BILIRUBIN TOTAL: 0.6 mg/dL (ref 0.3–1.2)
BUN: 11 mg/dL (ref 6–20)
CO2: 25 mmol/L (ref 22–32)
Calcium: 8.8 mg/dL — ABNORMAL LOW (ref 8.9–10.3)
Chloride: 100 mmol/L — ABNORMAL LOW (ref 101–111)
Creatinine, Ser: 1.04 mg/dL (ref 0.61–1.24)
GFR calc Af Amer: >60 mL/min (ref >60)
GFR calc non Af Amer: >60 mL/min (ref >60)
GLUCOSE: 251 mg/dL — AB (ref 65–99)
POTASSIUM: 4.2 mmol/L (ref 3.5–5.1)
SODIUM: 133 mmol/L — AB (ref 135–145)
TOTAL PROTEIN: 6.1 g/dL — AB (ref 6.5–8.1)

## 2016-11-21 LAB — HEMOGLOBIN A1C
Hgb A1c MFr Bld: 10.9 % — ABNORMAL HIGH (ref 4.8–5.6)
MEAN PLASMA GLUCOSE: 266 mg/dL

## 2016-11-21 LAB — GLUCOSE, CAPILLARY
GLUCOSE-CAPILLARY: 168 mg/dL — AB (ref 65–99)
GLUCOSE-CAPILLARY: 298 mg/dL — AB (ref 65–99)
Glucose-Capillary: 242 mg/dL — ABNORMAL HIGH (ref 65–99)
Glucose-Capillary: 234 mg/dL — ABNORMAL HIGH (ref 65–99)

## 2016-11-21 LAB — PROTIME-INR
INR: 1.15
PROTHROMBIN TIME: 14.8 s (ref 11.4–15.2)

## 2016-11-21 LAB — CBC
HEMATOCRIT: 36.8 % — AB (ref 39.0–52.0)
Hemoglobin: 12.3 g/dL — ABNORMAL LOW (ref 13.0–17.0)
MCH: 28.7 pg (ref 26.0–34.0)
MCHC: 33.4 g/dL (ref 30.0–36.0)
MCV: 86 fL (ref 78.0–100.0)
Platelets: 178 10*3/uL (ref 150–400)
RBC: 4.28 MIL/uL (ref 4.22–5.81)
RDW: 12.4 % (ref 11.5–15.5)
WBC: 9.1 10*3/uL (ref 4.0–10.5)

## 2016-11-21 LAB — CG4 I-STAT (LACTIC ACID): LACTIC ACID, VENOUS: 2.33 mmol/L — AB (ref 0.5–1.9)

## 2016-11-21 MED ORDER — SODIUM CHLORIDE 0.9% FLUSH
10.0000 mL | INTRAVENOUS | Status: DC | PRN
  Administered 2016-11-25: 10 mL
  Filled 2016-11-21: qty 40

## 2016-11-21 MED ORDER — INSULIN GLARGINE 100 UNIT/ML ~~LOC~~ SOLN
16.0000 [IU] | Freq: Every day | SUBCUTANEOUS | Status: DC
  Administered 2016-11-21: 16 [IU] via SUBCUTANEOUS
  Filled 2016-11-21 (×2): qty 0.16

## 2016-11-21 MED ORDER — DEXTROSE 5 % IV SOLN
2.0000 g | Freq: Two times a day (BID) | INTRAVENOUS | Status: DC
  Administered 2016-11-21 – 2016-11-24 (×7): 2 g via INTRAVENOUS
  Filled 2016-11-21 (×10): qty 2

## 2016-11-21 NOTE — Progress Notes (Signed)
Pharmacy Antibiotic Note Austin Arnold is a 50 y.o. male admitted on 11/20/2016 with left great toe gangrene and Streptococcus agalactiae bacteremia per BCID from 12/4.  Hx of pseudomonas infection earlier this year.  At present patient is on Zosyn and vancomycin as pharmacy to aid in transition to Cefepime for treatment.  Plan: 1. Begin Cefepime 2 grams IV every 12 hours 2. Await vascular evaluation and potential surgical plans 3. Narrow abx as feasible  Height: 6\' 2"  (188 cm) Weight: 239 lb 9.6 oz (108.7 kg) IBW/kg (Calculated) : 82.2  Temp (24hrs), Avg:99.6 F (37.6 C), Min:98.3 F (36.8 C), Max:100.9 F (38.3 C)   Recent Labs Lab 11/20/16 1026 11/20/16 1134 11/20/16 1330 11/20/16 1505 11/21/16 0712  WBC 10.2  --   --   --  9.1  CREATININE 1.06  --   --   --  1.04  LATICACIDVEN  --  2.33* 1.2 0.9  --     Estimated Creatinine Clearance: 110.3 mL/min (by C-G formula based on SCr of 1.04 mg/dL).    Allergies  Allergen Reactions  . Sulfa Antibiotics Other (See Comments)    Reaction unknown occurred during childhood    Antimicrobials this admission:  12/4 zosyn >> 12/5 12/4 vanc >> 12/5  12/5 Cefepime >>    Microbiology results:  12/4 BCx: GPC 2/2 (BCID GBS) 01/29/16: R toe wound: P.aerginosa pan S   Thank you for allowing pharmacy to be a part of this patient's care.  03/28/16, PharmD, BCPS 11/21/2016, 2:24 PM Pager: [phone]

## 2016-11-21 NOTE — Progress Notes (Signed)
PHARMACY - PHYSICIAN COMMUNICATION CRITICAL VALUE ALERT - BLOOD CULTURE IDENTIFICATION (BCID)  Results for orders placed or performed during the hospital encounter of 11/20/16  Blood Culture ID Panel (Reflexed) (Collected: 11/20/2016  1:30 PM)  Result Value Ref Range   Enterococcus species NOT DETECTED NOT DETECTED   Listeria monocytogenes NOT DETECTED NOT DETECTED   Staphylococcus species NOT DETECTED NOT DETECTED   Staphylococcus aureus NOT DETECTED NOT DETECTED   Streptococcus species DETECTED (A) NOT DETECTED   Streptococcus agalactiae DETECTED (A) NOT DETECTED   Streptococcus pneumoniae NOT DETECTED NOT DETECTED   Streptococcus pyogenes NOT DETECTED NOT DETECTED   Acinetobacter baumannii NOT DETECTED NOT DETECTED   Enterobacteriaceae species NOT DETECTED NOT DETECTED   Enterobacter cloacae complex NOT DETECTED NOT DETECTED   Escherichia coli NOT DETECTED NOT DETECTED   Klebsiella oxytoca NOT DETECTED NOT DETECTED   Klebsiella pneumoniae NOT DETECTED NOT DETECTED   Proteus species NOT DETECTED NOT DETECTED   Serratia marcescens NOT DETECTED NOT DETECTED   Haemophilus influenzae NOT DETECTED NOT DETECTED   Neisseria meningitidis NOT DETECTED NOT DETECTED   Pseudomonas aeruginosa NOT DETECTED NOT DETECTED   Candida albicans NOT DETECTED NOT DETECTED   Candida glabrata NOT DETECTED NOT DETECTED   Candida krusei NOT DETECTED NOT DETECTED   Candida parapsilosis NOT DETECTED NOT DETECTED   Candida tropicalis NOT DETECTED NOT DETECTED    Name of physician (or Provider) Contacted: Maren Reamer, NP  Changes to prescribed antibiotics required: Continue current abx (Vancomycin and Zosyn). Consider narrow therapy.  Christoper Fabian, PharmD, BCPS Clinical pharmacist, pager [phone] 11/21/2016  5:25 AM

## 2016-11-21 NOTE — Progress Notes (Signed)
VASCULAR LAB PRELIMINARY  ARTERIAL  ABI completed: Right ABI of 1.44 and left ABI of 1.49 is suggestive of calcified arteries. Unable to perform toe pressures due to bilateral great toe ulcers.   RIGHT    LEFT    PRESSURE WAVEFORM  PRESSURE WAVEFORM  BRACHIAL 122 Triphasic BRACHIAL 124 Triphasic  DP 167 Triphasic DP 167 Triphasic  PT 179 Triphasic PT 185 Triphasic    RIGHT LEFT  ABI 1.44 1.49     Elsie Stain, RVT 11/21/2016, 11:19 AM

## 2016-11-21 NOTE — Progress Notes (Signed)
Subjective: The patient has left foot pain with ambulation which he reports is chronic.Dr. Katheren Shams the patient today at 2 PM.  Objective: Vital signs in last 24 hours: Temp:  [98.3 F (36.8 C)-100.9 F (38.3 C)] 99.2 F (37.3 C) (12/05 1508) Pulse Rate:  [70-105] 70 (12/05 1508) Resp:  [18-19] 18 (12/05 1508) BP: (94-120)/(60-64) 94/60 (12/05 1508) SpO2:  [97 %-99 %] 99 % (12/05 1508)  Intake/Output from previous day: 12/04 0701 - 12/05 0700 In: 1143.8 [P.O.:480; I.V.:113.8; IV Piggyback:550] Out: 300 [Urine:300] Intake/Output this shift: Total I/O In: 360 [P.O.:360] Out: -    Recent Labs  11/20/16 1026 11/21/16 0712  HGB 14.2 12.3*    Recent Labs  11/20/16 1026 11/21/16 0712  WBC 10.2 9.1  RBC 4.86 4.28  HCT 42.5 36.8*  PLT 211 178    Recent Labs  11/20/16 1026 11/21/16 0712  NA 135 133*  K 5.7* 4.2  CL 101 100*  CO2 25 25  BUN 13 11  CREATININE 1.06 1.04  GLUCOSE 273* 251*  CALCIUM 9.6 8.8*    Recent Labs  11/21/16 0712  INR 1.15  ------------------------------------------------------------------- Noninvasive Vascular Lab  Lower Extremity Arterial Evaluation  Patient:    Austin Arnold, Austin Arnold MR #:       [id number] Study Date: 11/21/2016 Gender:     M Age:        50 Height: Weight: BSA: Pt. Status: Room:       6N07C   Lavon Paganini  REFERRING    Marcos Eke  ADMITTING    Ozella Rocks  SONOGRAPHER  Chanda Busing, RVT  ATTENDING    Filbert Schilder  Reports also to:  ------------------------------------------------------------------- History and indications:  Indications  707.15 Ulcer of other part of foot.  History  Diagnostic evaluation. No prior study is available for comparison.   ------------------------------------------------------------------- Study information:  Study status:  Routine.  Procedure:  A vascular evaluation was performed. Image quality was adequate.    ABI.      Segmental pressure measurements, ankle-brachial index, and photoplethysmography.  Birthdate:  Patient birthdate: October 22, 1966. Age:  Patient is 50 yr old.  Sex:  Gender: male.  Study date: Study date: 11/21/2016. Study time: 10:58 AM.  Location:  Vascular laboratory.  Patient status:  Inpatient.  Brachial pressures:  +--------+-----+----+---+ !        !Right!Left!Max! +--------+-----+----+---+ !Systolic!122  !124 !124! +--------+-----+----+---+  Arterial pressure indices:  +-----------------+---------+--------------+---------+ !Location         !Pressure !Brachial index!Waveform ! +-----------------+---------+--------------+---------+ !Right post tibial!179 mm Hg!1.44          !Triphasic! +-----------------+---------+--------------+---------+ !Right dorsal ped !167 mm Hg!1.35          !Triphasic! +-----------------+---------+--------------+---------+ !Left post tibial !185 mm Hg!1.49          !Triphasic! +-----------------+---------+--------------+---------+ !Left dorsal ped  !167 mm Hg!1.35          !Triphasic! +-----------------+---------+--------------+---------+  ------------------------------------------------------------------- Summary: Right ABI of 1.44 and left ABI of 1.49 is suggestive of calcified arteries.  Unable to perform toe pressures due to bilateral great toe ulcers. Other specific details can be found in the table(s) above. Prepared and Electronically Authenticated by  Cari Caraway MD 2017-12-05T13:55:39 CLINICAL DATA:  Chronic wound on the left great toe in a diabetic patient. Question osteomyelitis.  EXAM: MRI OF THE LEFT FOOT WITHOUT CONTRAST  TECHNIQUE: Multiplanar, multisequence MR imaging of the left foot was performed. No intravenous contrast was administered.  COMPARISON:  Plain films left foot 11/20/2016.  FINDINGS: Bones/Joint/Cartilage  There is marrow edema throughout the distal phalanx of the great toe consistent  osteomyelitis. Destructive change of the tuft is identified. Mild subchondral edema is seen about the fourth tarsometatarsal joint consistent with degenerative change. Advanced first MTP osteoarthritis is also noted.  Ligaments  Intact.  Muscles and Tendons  Atrophy of the intrinsic musculature the foot is identified. No muscle or tendon tear.  Soft tissues  A fluid collection is seen surrounding the tuft of the distal phalanx of the great toe. The collection measures approximately 1.1 cm craniocaudal by 0.8 cm AP by 1.2 cm transverse and is consistent with abscess. A wound is seen in the plantar soft tissues at the level of the distal phalanx of the great toe. Subjacent to the wound, ill-defined T2 hyperintensity measuring 1.2 cm transverse by 0.7 cm craniocaudal by 1.3 cm long may be due to abscess.  IMPRESSION: Findings consistent with osteomyelitis throughout the distal phalanx of the great toe. Associated abscess formation surrounding the tip of the distal phalanx is identified as described above. Wound in the plantar soft tissues of the great toe with a possible underlying abscess is also identified.  Negative for septic joint.  Advanced first MTP osteoarthritis. Milder degree of fourth tarsometatarsal degenerative change is seen.   Electronically Signed   By: Drusilla Kanner M.D.   On: 11/21/2016 10:25   left lower extremity exam:Swelling of the left great toe with foul odor and dry gangrenous changes distally.no active drainage.  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  Plan:Dr. Luiz Blare had a lengthy discussion with the patient.  At this point we feel that left great toe amputation at the first MTP joint would be best. He has significant arthritis of the first MTP joint and an amputation more distal to that  would still  leave him with a painful arthritic joint there. We appreciate Dr. Adele Dan input who suggested that amputation tomorrow is appropriate and if he has poor blood supply at the time of surgery or trouble healing would then consider further vascular evaluation and treatment in the future. We will have the patient be NPO after midnight  and schedule his surgery in the a.m.  Jeneva Schweizer G 11/21/2016, 6:47 PM

## 2016-11-21 NOTE — Progress Notes (Signed)
I came by to see the patient. He had just left the floor to get either his ABIs or MRI. We will review his results later today and make recommendations about probable left great toe amputation tomorrow.

## 2016-11-21 NOTE — Progress Notes (Signed)
PROGRESS NOTE    Austin Arnold  XQJ:[id number] DOB: [birth year] DOA: 11/20/2016 PCP: [name], MD   Brief Narrative:  Austin Arnold is a 50 y.o. male with a past medical history remarkable for diabetes, and rheumatoid arthritis on chronic prednisone, presenting with progressive left great toe pain with purulence. In review, the patient had been treated for presume osteomyelitis with 6 days of doxycycline as an outpatient, without improvement of his symptoms. The pain begun after heating his left foods with the [place] 3 weeks ago, with progressive pain, anderythema in the area. He denies any fever or chills. He denies any sick contacts. No other areas of acute injury. He does work in the outdoors frequent sleep. The patient reports having had a prior history of Pseudomonas infection on the right foot , with similar symptoms, treated with antibiotics successfully.  In addition, he complains of non-resolving chest discomfort, with some shortness of breath, which has been evaluated by cardiology, but of chronic nature. He had failed cabbage in February 2017, but her recent cardiac catheterization with stent in June of this year, and 2D echo did not show any changes. In fact, today's EKG and troponin server negative as well. BNP is currently pending. He denies any nausea, vomiting diarrhea, urinary complaints. He denies any leg swelling. However, his left lower extremity hurts with ambulation, but this is chronic.  ED Course:  BP 117/64   Pulse 69   Temp 98.2 F (36.8 C) (Oral)   Resp 13   Ht 6\' 2"  (1.88 m)   Wt 105.2 kg (232 lb)   SpO2 100%   BMI 29.79 kg/m    glucose is 273 sodium 135 potassium 5.7 bicarb 25 BUN 13 creatinine 1.06 troponin 0 white count 10.2 hemoglobin 14.2 platelets 211  Left foot XR consistent with 1st distant phalanx osteomyelitis. MRI foot pending . Ortho consult is pending for surgical intervention received Lasix 40 mg IV x1  he received morphine for  pain Zosyn nd vancomycin IV 500 cc IVF    Assessment & Plan:   Active Problems:   DM (diabetes mellitus), type 2, uncontrolled with complications (HCC)   Hyperlipidemia   Cardiomyopathy, ischemic-EF 45-50% last echo   Bipolar 1 disorder (HCC)   S/P CABG x 4   Cardiomegaly   Dyspnea   Rheumatoid arthritis (HCC)   Stented coronary artery   Osteomyelitis (HCC)   Great toe pain, left   Other osteomyelitis (HCC)   Osteomyelitis of left foot (HCC)   PVD (peripheral vascular disease) (HCC)   Left great toe pain with purulence  likely due to presumed osteomyelitis failing outpatient treatment with 6 days of doxcycline, and a history of rheumathoid arthitis on chronic steroids (deltasone 5 mg) and diabetes  Left foot XR consistent with 1st distant phalanx osteomyelitis. MRI foot pending . Ortho consult is pending for surgical intervention  Afebrile . WBC 10.2  Lactic acid pending . History of left great toe infection with pseudomonas in the past.  - ABI showed calcified vessels - Vascular surgery consulted  -Admit to inpatient telemetry - Blood culture positive for strep - will transition to Cefepime - Will await for other recommendations by Ortho  Wound consult  PT/OT   Hyperkalemia  - repeat K is WNL - Telemetry - repeat BMP in am   Hypertension BP 117/64   Pulse 69  Controlled Continue home anti-hypertensive medications   Hyperlipidemia Continue home statins   Chronic diastolic heart failure/ ICM , last echocardiogram with normal 01/2016 with  EF 45-50, grade 2 DD, current weight 105 . CXR NAD. BNP is 104.9. Mild shortness of breath Appears compensated.    - Careful use of IVF- will decrease rate to 24ml/hr - Daily weights, strict I/O - net + of 843 yesterdady  CAD, s/p failed CABG 01/2016/ history of PVD    EKG and Troponin  Neg last cardiac catheterization with stent  in 05/2016 without acute findings. Has a history  of chronic chest pain, may have anxiety  component  Continue ASA and home meds    Type II Diabetes Current blood sugar level is 249     Hgb A1C is 10.9 Hold home oral diabetic medications.   SSI Heart healthy carb modified diet. Diabetes education to discuss control of blood glucose at home with patient and his wife   Bipolar disorder/ Depression Continue meds with Ranexa, Zoloft, Lithium, Desyrel   DVT prophylaxis: Lovenox  Code Status:   Full     Family Communication:  Discussed with patient Disposition Plan: Expect patient to be discharged to home after condition improves   Consultants:   Orthopedics  Vascular Surgery  Procedures:   Pending per vascular surgery and orthopedic teams  Antimicrobials:   Zosyn 12/4>12/5  Vancomycin 12/4>12/5  Cefepime 12/5>    Subjective: Patient seen and examined.  Says he has had poor follow up outpatient due to financial constraint.  Denies pain, says his toe has been the way it is currently for quite some time.  Objective: Vitals:   11/20/16 1355 11/20/16 2111 11/21/16 0617 11/21/16 1508  BP: (!) 142/81 120/64 113/61 94/60  Pulse: 83 (!) 105 73 70  Resp: 13 19 19 18   Temp: 98.3 F (36.8 C) (!) 100.9 F (38.3 C) 98.3 F (36.8 C) 99.2 F (37.3 C)  TempSrc: Oral Oral Oral Oral  SpO2: 100% 97% 99% 99%  Weight: 108.7 kg (239 lb 9.6 oz)     Height: 6\' 2"  (1.88 m)       Intake/Output Summary (Last 24 hours) at 11/21/16 1605 Last data filed at 11/21/16 1510  Gross per 24 hour  Intake              840 ml  Output              300 ml  Net              540 ml   Filed Weights   11/20/16 1027 11/20/16 1355  Weight: 105.2 kg (232 lb) 108.7 kg (239 lb 9.6 oz)    Examination:  General exam: Appears calm and comfortable  Respiratory system: Clear to auscultation. Respiratory effort normal. Cardiovascular system: S1 & S2 heard, RRR. No JVD, murmurs, rubs, gallops or clicks. No pedal edema. Gastrointestinal system: Abdomen is nondistended, soft and  nontender. No organomegaly or masses felt. Normal bowel sounds heard. Central nervous system: Alert and oriented. No focal neurological deficits. Extremities: Symmetric 5 x 5 power. Skin: left great toe with open lesion and eschar and foul odor Psychiatry: Judgement and insight appear normal. Mood & affect appropriate.     Data Reviewed: I have personally reviewed following labs and imaging studies  CBC:  Recent Labs Lab 11/20/16 1026 11/21/16 0712  WBC 10.2 9.1  HGB 14.2 12.3*  HCT 42.5 36.8*  MCV 87.4 86.0  PLT 211 178   Basic Metabolic Panel:  Recent Labs Lab 11/20/16 1026 11/21/16 0712  NA 135 133*  K 5.7* 4.2  CL 101 100*  CO2 25 25  GLUCOSE 273* 251*  BUN 13 11  CREATININE 1.06 1.04  CALCIUM 9.6 8.8*   GFR: Estimated Creatinine Clearance: 110.3 mL/min (by C-G formula based on SCr of 1.04 mg/dL). Liver Function Tests:  Recent Labs Lab 11/20/16 1026 11/21/16 0712  AST 19 15  ALT 12* 12*  ALKPHOS 75 63  BILITOT 1.0 0.6  PROT 7.3 6.1*  ALBUMIN 3.2* 2.6*   No results for input(s): LIPASE, AMYLASE in the last 168 hours. No results for input(s): AMMONIA in the last 168 hours. Coagulation Profile:  Recent Labs Lab 11/21/16 0712  INR 1.15   Cardiac Enzymes: No results for input(s): CKTOTAL, CKMB, CKMBINDEX, TROPONINI in the last 168 hours. BNP (last 3 results) No results for input(s): PROBNP in the last 8760 hours. HbA1C:  Recent Labs  11/20/16 1330  HGBA1C 10.9*   CBG:  Recent Labs Lab 11/20/16 1400 11/20/16 1721 11/20/16 2111 11/21/16 0748 11/21/16 1147  GLUCAP 276* 263* 186* 242* 168*   Lipid Profile: No results for input(s): CHOL, HDL, LDLCALC, TRIG, CHOLHDL, LDLDIRECT in the last 72 hours. Thyroid Function Tests: No results for input(s): TSH, T4TOTAL, FREET4, T3FREE, THYROIDAB in the last 72 hours. Anemia Panel: No results for input(s): VITAMINB12, FOLATE, FERRITIN, TIBC, IRON, RETICCTPCT in the last 72 hours. Sepsis  Labs:  Recent Labs Lab 11/20/16 1134 11/20/16 1330 11/20/16 1505  LATICACIDVEN 2.33* 1.2 0.9    Recent Results (from the past 240 hour(s))  Culture, blood (Routine X 2) w Reflex to ID Panel     Status: None (Preliminary result)   Collection Time: 11/20/16  1:30 PM  Result Value Ref Range Status   Specimen Description BLOOD LEFT ANTECUBITAL  Final   Special Requests   Final    BOTTLES DRAWN AEROBIC AND ANAEROBIC  10CC AER AND 5CC ANA   Culture  Setup Time   Final    GRAM POSITIVE COCCI IN CHAINS IN BOTH AEROBIC AND ANAEROBIC BOTTLES Organism ID to follow    Culture NO GROWTH 1 DAY  Final   Report Status PENDING  Incomplete  Blood Culture ID Panel (Reflexed)     Status: Abnormal   Collection Time: 11/20/16  1:30 PM  Result Value Ref Range Status   Enterococcus species NOT DETECTED NOT DETECTED Final   Listeria monocytogenes NOT DETECTED NOT DETECTED Final   Staphylococcus species NOT DETECTED NOT DETECTED Final   Staphylococcus aureus NOT DETECTED NOT DETECTED Final   Streptococcus species DETECTED (A) NOT DETECTED Final    Comment: CRITICAL RESULT CALLED TO, READ BACK BY AND VERIFIED WITH: CARON AMEND,PHARMD @0454  11/21/16 MKELLY,MLT    Streptococcus agalactiae DETECTED (A) NOT DETECTED Final    Comment: CRITICAL RESULT CALLED TO, READ BACK BY AND VERIFIED WITH: CARON AMEND,PHARMD @0454  11/21/16 MKELLY,MLT    Streptococcus pneumoniae NOT DETECTED NOT DETECTED Final   Streptococcus pyogenes NOT DETECTED NOT DETECTED Final   Acinetobacter baumannii NOT DETECTED NOT DETECTED Final   Enterobacteriaceae species NOT DETECTED NOT DETECTED Final   Enterobacter cloacae complex NOT DETECTED NOT DETECTED Final   Escherichia coli NOT DETECTED NOT DETECTED Final   Klebsiella oxytoca NOT DETECTED NOT DETECTED Final   Klebsiella pneumoniae NOT DETECTED NOT DETECTED Final   Proteus species NOT DETECTED NOT DETECTED Final   Serratia marcescens NOT DETECTED NOT DETECTED Final    Haemophilus influenzae NOT DETECTED NOT DETECTED Final   Neisseria meningitidis NOT DETECTED NOT DETECTED Final   Pseudomonas aeruginosa NOT DETECTED NOT DETECTED Final   Candida albicans NOT DETECTED NOT  DETECTED Final   Candida glabrata NOT DETECTED NOT DETECTED Final   Candida krusei NOT DETECTED NOT DETECTED Final   Candida parapsilosis NOT DETECTED NOT DETECTED Final   Candida tropicalis NOT DETECTED NOT DETECTED Final  Culture, blood (Routine X 2) w Reflex to ID Panel     Status: None (Preliminary result)   Collection Time: 11/20/16  1:43 PM  Result Value Ref Range Status   Specimen Description BLOOD LEFT HAND  Final   Special Requests BOTTLES DRAWN AEROBIC ONLY  10CC  Final   Culture  Setup Time   Final    GRAM POSITIVE COCCI IN CHAINS AEROBIC BOTTLE ONLY CRITICAL VALUE NOTED.  VALUE IS CONSISTENT WITH PREVIOUSLY REPORTED AND CALLED VALUE.    Culture NO GROWTH 1 DAY  Final   Report Status PENDING  Incomplete         Radiology Studies: Dg Chest 2 View  Result Date: 11/20/2016 CLINICAL DATA:  50 year old male with a history of diabetic infection EXAM: CHEST  2 VIEW COMPARISON:  06/15/2016 FINDINGS: Cardiomediastinal silhouette is unchanged. Surgical changes of median sternotomy and CABG. No central vascular congestion. Lung volumes are adequate with no confluent airspace disease. No pleural effusion or pneumothorax. Symmetric nodular densities at the base of the lungs, favored to represent nipple shadows. No displaced fracture. IMPRESSION: No radiographic evidence of acute cardiopulmonary disease. Surgical changes of prior median sternotomy and CABG. Signed, Yvone Neu. Loreta Ave, DO [facility] Radiology Electronically Signed   By: Gilmer Mor D.O.   On: 11/20/2016 11:47   Mr Foot Left Wo Contrast  Result Date: 11/21/2016 CLINICAL DATA:  Chronic wound on the left great toe in a diabetic patient. Question osteomyelitis. EXAM: MRI OF  THE LEFT FOOT WITHOUT CONTRAST TECHNIQUE: Multiplanar, multisequence MR imaging of the left foot was performed. No intravenous contrast was administered. COMPARISON:  Plain films left foot 11/20/2016. FINDINGS: Bones/Joint/Cartilage There is marrow edema throughout the distal phalanx of the great toe consistent osteomyelitis. Destructive change of the tuft is identified. Mild subchondral edema is seen about the fourth tarsometatarsal joint consistent with degenerative change. Advanced first MTP osteoarthritis is also noted. Ligaments Intact. Muscles and Tendons Atrophy of the intrinsic musculature the foot is identified. No muscle or tendon tear. Soft tissues A fluid collection is seen surrounding the tuft of the distal phalanx of the great toe. The collection measures approximately 1.1 cm craniocaudal by 0.8 cm AP by 1.2 cm transverse and is consistent with abscess. A wound is seen in the plantar soft tissues at the level of the distal phalanx of the great toe. Subjacent to the wound, ill-defined T2 hyperintensity measuring 1.2 cm transverse by 0.7 cm craniocaudal by 1.3 cm long may be due to abscess. IMPRESSION: Findings consistent with osteomyelitis throughout the distal phalanx of the great toe. Associated abscess formation surrounding the tip of the distal phalanx is identified as described above. Wound in the plantar soft tissues of the great toe with a possible underlying abscess is also identified. Negative for septic joint. Advanced first MTP osteoarthritis. Milder degree of fourth tarsometatarsal degenerative change is seen. Electronically Signed   By: Drusilla Kanner M.D.   On: 11/21/2016 10:25   Dg Foot Complete Left  Result Date: 11/20/2016 CLINICAL DATA:  First toe infection. EXAM: LEFT FOOT - COMPLETE 3+ VIEW COMPARISON:  None. FINDINGS: Deformity of the second and third proximal metatarsals is noted consistent with old fractures. Osteoarthritis of the first metatarsophalangeal joint is noted  with  hallux valgus deformity. There is lytic destruction seen involving the distal tuft of the first distal phalanx concerning for osteomyelitis. Postsurgical changes are noted in distal tibia. IMPRESSION: Lytic destruction of distal tuft of first distal phalanx consistent with osteomyelitis. Degenerative and posttraumatic changes are noted as well. Electronically Signed   By: Lupita RaiderJames  Green Jr, M.D.   On: 11/20/2016 11:38        Scheduled Meds: . ceFEPime (MAXIPIME) IV  2 g Intravenous Q12H  . enoxaparin (LOVENOX) injection  50 mg Subcutaneous Q24H  . insulin aspart  0-9 Units Subcutaneous TID WC  . insulin glargine  16 Units Subcutaneous QHS  . irbesartan  75 mg Oral Daily  . isosorbide mononitrate  30 mg Oral BID  . lithium  600 mg Oral Daily  . metoprolol  50 mg Oral BID  . predniSONE  5 mg Oral BID  . ranolazine  500 mg Oral BID  . rosuvastatin  40 mg Oral Daily  . sertraline  100 mg Oral Daily  . sodium chloride flush  3 mL Intravenous Q12H  . traZODone  100 mg Oral QHS   Continuous Infusions: . sodium chloride 75 mL/hr at 11/20/16 1408     LOS: 1 day    Time spent: 35 minutes    Katrinka BlazingAlex U Kadolph, MD [facility] Pager [phone]  If 7PM-7AM, please contact night-coverage [email or web address] Password [email or web address] 11/21/2016, 4:05 PM

## 2016-11-21 NOTE — Progress Notes (Signed)
PT Cancellation Note  Patient Details Name: Austin Arnold MRN: [id number] DOB: Feb 19, 1966   Cancelled Treatment:    Reason Eval/Treat Not Completed: Other (comment). Spoke with pt and [name]. Per pt, pending MRI pt planned for OR for L great toe amputation Wednesday 12/6. RN reports pt ambulating laps around the floor on own with IV pole. Pt with no current Acute PT needs at this time. Please re-consult s/p surgery if/when appropriate. PT to follow pending order s/p surgery.    Aksel Bencomo M Eustolia Drennen 11/21/2016, 7:50 AM   Lewis Shock, PT, DPT Pager #: [phone] Office #: [phone]

## 2016-11-21 NOTE — Progress Notes (Addendum)
Inpatient Diabetes Program Recommendations  AACE/ADA: New Consensus Statement on Inpatient Glycemic Control (2015)  Target Ranges:  Prepandial:   less than 140 mg/dL      Peak postprandial:   less than 180 mg/dL (1-2 hours)      Critically ill patients:  140 - 180 mg/dL   Lab Results  Component Value Date   GLUCAP 242 (H) 11/21/2016   HGBA1C 10.9 (H) 11/20/2016   Review of Glycemic Control  Diabetes history: DM 2 Outpatient Diabetes medications: Toujeo 48 units Daily, Metformin 1,000 mg BID Current orders for Inpatient glycemic control: Novolog Sensitive TID   Inpatient Diabetes Program Recommendations:   Glucose in the 200's. Patient takes basal insulin at home. Please consider starting a portion of patient's home dose of Toujeo, Lantus 18-20 units Q24hours to receive today.  A1c 10.9% on 11/20/16, uncontrolled. Will see patient today.  Thanks,  Christena Deem RN, MSN, [facility] Inpatient Diabetes Coordinator Team Pager [phone] (8a-5p)

## 2016-11-21 NOTE — Anesthesia Preprocedure Evaluation (Addendum)
Anesthesia Evaluation  Patient identified by MRN, date of birth, ID band Patient awake    Reviewed: Allergy & Precautions, NPO status , Patient's Chart, lab work & pertinent test results  Airway Mallampati: II  TM Distance: >3 FB Neck ROM: Full    Dental   Pulmonary sleep apnea , former smoker,    breath sounds clear to auscultation       Cardiovascular hypertension, Pt. on medications + angina + CAD, + Past MI, + Cardiac Stents, + CABG and + Peripheral Vascular Disease   Rhythm:Regular Rate:Normal  Spoke with Dr. Royann Shivers. Pt on Twilight study with only 1week to go. Pt has not received Brilinta while in hospital. He states okay to restart after surgery today.   Neuro/Psych Depression Bipolar Disorder negative neurological ROS     GI/Hepatic Neg liver ROS, GERD  ,  Endo/Other  diabetes, Type 2, Insulin Dependent  Renal/GU negative Renal ROS     Musculoskeletal  (+) Arthritis ,   Abdominal   Peds  Hematology  (+) anemia ,   Anesthesia Other Findings   Reproductive/Obstetrics                            Lab Results  Component Value Date   WBC 9.1 11/21/2016   HGB 12.3 (L) 11/21/2016   HCT 36.8 (L) 11/21/2016   MCV 86.0 11/21/2016   PLT 178 11/21/2016   Lab Results  Component Value Date   CREATININE 1.04 11/21/2016   BUN 11 11/21/2016   NA 133 (L) 11/21/2016   K 4.2 11/21/2016   CL 100 (L) 11/21/2016   CO2 25 11/21/2016   Lab Results  Component Value Date   INR 1.15 11/21/2016   INR 0.90 05/17/2016   INR 1.34 01/20/2016    Anesthesia Physical Anesthesia Plan  ASA: III  Anesthesia Plan: MAC and Regional   Post-op Pain Management:    Induction: Intravenous  Airway Management Planned: Natural Airway and Simple Face Mask  Additional Equipment:   Intra-op Plan:   Post-operative Plan:   Informed Consent: I have reviewed the patients History and Physical, chart, labs and  discussed the procedure including the risks, benefits and alternatives for the proposed anesthesia with the patient or authorized representative who has indicated his/her understanding and acceptance.   Dental advisory given  Plan Discussed with: CRNA  Anesthesia Plan Comments:        Anesthesia Quick Evaluation

## 2016-11-21 NOTE — Progress Notes (Signed)
Inpatient Diabetes Program Recommendations  AACE/ADA: New Consensus Statement on Inpatient Glycemic Control (2015)  Target Ranges:  Prepandial:   less than 140 mg/dL      Peak postprandial:   less than 180 mg/dL (1-2 hours)      Critically ill patients:  140 - 180 mg/dL   Lab Results  Component Value Date   GLUCAP 168 (H) 11/21/2016   HGBA1C 10.9 (H) 11/20/2016    Spoke with patient about diabetes and home regimen for diabetes control. Patient reports that he is followed by PCP for diabetes management and currently takes Toujeo and Metformin as an outpatient for diabetes control. Patient reports that he is taking his home medications as prescribed, however he and his wife are big snack binge eaters. The patient described how he can eat a bag of chips in one sitting and starts eating on his wife's "stash." The other night he and his wife consumed one whole pack of cookies between them. Patient reports that at one time he did get his A1c down to around 5%. Patient also reports that once he starts eating something its hard to stop. Patient also reported that while we were talking all he was thinking about was how his wife could buy him an extra [place] sub from subway in addition to his lunch tray. Patient reports that he is running out of test strips but cannot remember hi meter brand for and prescription refill. Patient know his A1c is aroud 10%. Patient and I discussed an action plan for hi at discharge. A barrier identified is that his wife does not see why she has to change what food is in the house because she does not have the Diabetes diagnosis. When they are at the grocery store she asks the patient if he wants cookies or chips. Patient has a strong weakness toward food. Patient use to be ETOH dependent. I am not wondering if he is replacing food in the ETOH's place. Discussed importance of CBG control to prevent long-term and short-term complications. Explained how hyperglycemia leads to damage  within blood vessels which lead to the common complications seen with uncontrolled diabetes. Patient reports concern and fear in regards to tomorrow's amputation surgery. Patient reports that his brother had his toe cut off and they kept cutting.   I tried to come back to speak with wife but she came and went without me seeing. Patient reports she has Friday off. Will try to speak with her about patient support with glucose and diet control.   Thanks,  Christena Deem RN, MSN, [facility] Inpatient Diabetes Coordinator Team Pager [phone] (8a-5p)
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
## 2016-11-22 ENCOUNTER — Encounter (HOSPITAL_COMMUNITY)
Admission: EM | Disposition: A | Discharge status: Home / Self Care | Payer: Self-pay | Source: Home / Self Care | Attending: Internal Medicine

## 2016-11-22 ENCOUNTER — Inpatient Hospital Stay (HOSPITAL_COMMUNITY): Payer: Medicare Other | Admitting: Anesthesiology

## 2016-11-22 DIAGNOSIS — E1165 Type 2 diabetes mellitus with hyperglycemia: Secondary | ICD-10-CM

## 2016-11-22 DIAGNOSIS — E118 Type 2 diabetes mellitus with unspecified complications: Secondary | ICD-10-CM

## 2016-11-22 DIAGNOSIS — E1169 Type 2 diabetes mellitus with other specified complication: Secondary | ICD-10-CM

## 2016-11-22 DIAGNOSIS — M79675 Pain in left toe(s): Secondary | ICD-10-CM

## 2016-11-22 DIAGNOSIS — Z794 Long term (current) use of insulin: Secondary | ICD-10-CM

## 2016-11-22 DIAGNOSIS — E785 Hyperlipidemia, unspecified: Secondary | ICD-10-CM

## 2016-11-22 DIAGNOSIS — Z955 Presence of coronary angioplasty implant and graft: Secondary | ICD-10-CM

## 2016-11-22 DIAGNOSIS — E782 Mixed hyperlipidemia: Secondary | ICD-10-CM

## 2016-11-22 DIAGNOSIS — F319 Bipolar disorder, unspecified: Secondary | ICD-10-CM

## 2016-11-22 DIAGNOSIS — I739 Peripheral vascular disease, unspecified: Secondary | ICD-10-CM

## 2016-11-22 DIAGNOSIS — I255 Ischemic cardiomyopathy: Secondary | ICD-10-CM

## 2016-11-22 HISTORY — PX: AMPUTATION: SHX166

## 2016-11-22 LAB — BASIC METABOLIC PANEL
ANION GAP: 9 (ref 5–15)
BUN: 12 mg/dL (ref 6–20)
CALCIUM: 9 mg/dL (ref 8.9–10.3)
CHLORIDE: 101 mmol/L (ref 101–111)
CO2: 24 mmol/L (ref 22–32)
Creatinine, Ser: 1.03 mg/dL (ref 0.61–1.24)
GFR calc Af Amer: >60 mL/min (ref >60)
GFR calc non Af Amer: >60 mL/min (ref >60)
GLUCOSE: 266 mg/dL — AB (ref 65–99)
POTASSIUM: 5.1 mmol/L (ref 3.5–5.1)
Sodium: 134 mmol/L — ABNORMAL LOW (ref 135–145)

## 2016-11-22 LAB — GLUCOSE, CAPILLARY
GLUCOSE-CAPILLARY: 254 mg/dL — AB (ref 65–99)
Glucose-Capillary: 215 mg/dL — ABNORMAL HIGH (ref 65–99)
Glucose-Capillary: 222 mg/dL — ABNORMAL HIGH (ref 65–99)
Glucose-Capillary: 371 mg/dL — ABNORMAL HIGH (ref 65–99)
Glucose-Capillary: 239 mg/dL — ABNORMAL HIGH (ref 65–99)

## 2016-11-22 LAB — SURGICAL PCR SCREEN
MRSA, PCR: NEGATIVE
STAPHYLOCOCCUS AUREUS: NEGATIVE

## 2016-11-22 SURGERY — AMPUTATION, FOOT, PARTIAL
Anesthesia: Monitor Anesthesia Care | Site: Foot | Laterality: Left

## 2016-11-22 MED ORDER — LIDOCAINE 2% (20 MG/ML) 5 ML SYRINGE
INTRAMUSCULAR | Status: AC
  Filled 2016-11-22: qty 5

## 2016-11-22 MED ORDER — PROPOFOL 10 MG/ML IV BOLUS
INTRAVENOUS | Status: AC
  Filled 2016-11-22: qty 20

## 2016-11-22 MED ORDER — MEPIVACAINE HCL 1.5 % IJ SOLN
INTRAMUSCULAR | Status: DC | PRN
Start: 2016-11-22 — End: 2016-11-22
  Administered 2016-11-22: 15 mL via EPIDURAL

## 2016-11-22 MED ORDER — FENTANYL CITRATE (PF) 100 MCG/2ML IJ SOLN
INTRAMUSCULAR | Status: AC
  Filled 2016-11-22: qty 4

## 2016-11-22 MED ORDER — PHENYLEPHRINE HCL 10 MG/ML IJ SOLN
INTRAMUSCULAR | Status: DC | PRN
  Administered 2016-11-22: 80 ug via INTRAVENOUS
  Administered 2016-11-22 (×2): 120 ug via INTRAVENOUS
  Administered 2016-11-22: 80 ug via INTRAVENOUS

## 2016-11-22 MED ORDER — CEFAZOLIN SODIUM-DEXTROSE 2-4 GM/100ML-% IV SOLN: 2.0000 g | INTRAVENOUS | Status: DC

## 2016-11-22 MED ORDER — MIDAZOLAM HCL 5 MG/5ML IJ SOLN
INTRAMUSCULAR | Status: DC | PRN
  Administered 2016-11-22: 2 mg via INTRAVENOUS

## 2016-11-22 MED ORDER — LIDOCAINE HCL (CARDIAC) 20 MG/ML IV SOLN
INTRAVENOUS | Status: DC | PRN
  Administered 2016-11-22: 100 mg via INTRAVENOUS

## 2016-11-22 MED ORDER — INSULIN GLARGINE 100 UNIT/ML ~~LOC~~ SOLN
20.0000 [IU] | Freq: Every day | SUBCUTANEOUS | Status: DC
  Administered 2016-11-22: 20 [IU] via SUBCUTANEOUS
  Filled 2016-11-22 (×2): qty 0.2

## 2016-11-22 MED ORDER — CEFAZOLIN SODIUM 1 G IJ SOLR
INTRAMUSCULAR | Status: DC | PRN
  Administered 2016-11-22: 2 g via INTRAMUSCULAR

## 2016-11-22 MED ORDER — 0.9 % SODIUM CHLORIDE (POUR BTL) OPTIME
TOPICAL | Status: DC | PRN
  Administered 2016-11-22: 1000 mL

## 2016-11-22 MED ORDER — PROPOFOL 500 MG/50ML IV EMUL
INTRAVENOUS | Status: DC | PRN
  Administered 2016-11-22: 75 ug/kg/min via INTRAVENOUS

## 2016-11-22 MED ORDER — HYDROMORPHONE HCL 1 MG/ML IJ SOLN: 0.2500 mg | INTRAMUSCULAR | Status: DC | PRN

## 2016-11-22 MED ORDER — INSULIN ASPART 100 UNIT/ML ~~LOC~~ SOLN
0.0000 [IU] | Freq: Three times a day (TID) | SUBCUTANEOUS | Status: DC
  Administered 2016-11-22: 8 [IU] via SUBCUTANEOUS
  Administered 2016-11-23: 5 [IU] via SUBCUTANEOUS
  Administered 2016-11-23: 11 [IU] via SUBCUTANEOUS
  Administered 2016-11-23: 3 [IU] via SUBCUTANEOUS
  Administered 2016-11-24: 15 [IU] via SUBCUTANEOUS
  Administered 2016-11-24 – 2016-11-25 (×3): 5 [IU] via SUBCUTANEOUS

## 2016-11-22 MED ORDER — MORPHINE SULFATE (PF) 2 MG/ML IV SOLN
2.0000 mg | INTRAVENOUS | Status: AC | PRN
  Administered 2016-11-22 – 2016-11-23 (×2): 2 mg via INTRAVENOUS
  Filled 2016-11-22 (×2): qty 1

## 2016-11-22 MED ORDER — MIDAZOLAM HCL 2 MG/2ML IJ SOLN
INTRAMUSCULAR | Status: AC
  Filled 2016-11-22: qty 2

## 2016-11-22 MED ORDER — PROPOFOL 10 MG/ML IV BOLUS
INTRAVENOUS | Status: DC | PRN
  Administered 2016-11-22: 70 mg via INTRAVENOUS

## 2016-11-22 MED ORDER — ENOXAPARIN SODIUM 60 MG/0.6ML ~~LOC~~ SOLN
50.0000 mg | SUBCUTANEOUS | Status: DC
  Administered 2016-11-22 – 2016-11-23 (×2): 50 mg via SUBCUTANEOUS
  Filled 2016-11-22 (×2): qty 0.6

## 2016-11-22 MED ORDER — BUPIVACAINE-EPINEPHRINE (PF) 0.5% -1:200000 IJ SOLN
INTRAMUSCULAR | Status: DC | PRN
  Administered 2016-11-22: 15 mL

## 2016-11-22 MED ORDER — TICAGRELOR 90 MG PO TABS
90.0000 mg | ORAL_TABLET | Freq: Two times a day (BID) | ORAL | Status: DC
  Administered 2016-11-22 – 2016-11-25 (×7): 90 mg via ORAL
  Filled 2016-11-22 (×7): qty 1

## 2016-11-22 SURGICAL SUPPLY — 47 items
BANDAGE ACE 4X5 VEL STRL LF (GAUZE/BANDAGES/DRESSINGS) ×3 IMPLANT
BANDAGE ELASTIC 3 VELCRO ST LF (GAUZE/BANDAGES/DRESSINGS) IMPLANT
BNDG COHESIVE 1X5 TAN STRL LF (GAUZE/BANDAGES/DRESSINGS) IMPLANT
BNDG CONFORM 2 STRL LF (GAUZE/BANDAGES/DRESSINGS) IMPLANT
BNDG ESMARK 4X9 LF (GAUZE/BANDAGES/DRESSINGS) IMPLANT
BNDG GAUZE ELAST 4 BULKY (GAUZE/BANDAGES/DRESSINGS) ×3 IMPLANT
COVER SURGICAL LIGHT HANDLE (MISCELLANEOUS) ×3 IMPLANT
CUFF TOURNIQUET SINGLE 18IN (TOURNIQUET CUFF) IMPLANT
CUFF TOURNIQUET SINGLE 24IN (TOURNIQUET CUFF) ×3 IMPLANT
CUFF TOURNIQUET SINGLE 34IN LL (TOURNIQUET CUFF) IMPLANT
CUFF TOURNIQUET SINGLE 44IN (TOURNIQUET CUFF) IMPLANT
DRAIN PENROSE 1/4X12 LTX STRL (WOUND CARE) ×3 IMPLANT
DRAPE U-SHAPE 47X51 STRL (DRAPES) ×3 IMPLANT
DRSG ADAPTIC 3X8 NADH LF (GAUZE/BANDAGES/DRESSINGS) IMPLANT
GAUZE SPONGE 2X2 8PLY STRL LF (GAUZE/BANDAGES/DRESSINGS) IMPLANT
GAUZE SPONGE 4X4 12PLY STRL (GAUZE/BANDAGES/DRESSINGS) IMPLANT
GAUZE XEROFORM 1X8 LF (GAUZE/BANDAGES/DRESSINGS) ×3 IMPLANT
GLOVE BIOGEL PI IND STRL 8 (GLOVE) ×2 IMPLANT
GLOVE BIOGEL PI INDICATOR 8 (GLOVE) ×4
GLOVE ECLIPSE 7.5 STRL STRAW (GLOVE) ×6 IMPLANT
GOWN STRL REUS W/ TWL LRG LVL3 (GOWN DISPOSABLE) ×2 IMPLANT
GOWN STRL REUS W/ TWL XL LVL3 (GOWN DISPOSABLE) ×2 IMPLANT
GOWN STRL REUS W/TWL LRG LVL3 (GOWN DISPOSABLE) ×4
GOWN STRL REUS W/TWL XL LVL3 (GOWN DISPOSABLE) ×4
KIT BASIN OR (CUSTOM PROCEDURE TRAY) ×3 IMPLANT
KIT ROOM TURNOVER OR (KITS) ×3 IMPLANT
MANIFOLD NEPTUNE II (INSTRUMENTS) IMPLANT
NS IRRIG 1000ML POUR BTL (IV SOLUTION) ×3 IMPLANT
PACK ORTHO EXTREMITY (CUSTOM PROCEDURE TRAY) IMPLANT
PAD ARMBOARD 7.5X6 YLW CONV (MISCELLANEOUS) ×6 IMPLANT
PAD CAST 4YDX4 CTTN HI CHSV (CAST SUPPLIES) IMPLANT
PADDING CAST COTTON 4X4 STRL (CAST SUPPLIES)
SOLUTION BETADINE 4OZ (MISCELLANEOUS) IMPLANT
SPECIMEN JAR SMALL (MISCELLANEOUS) IMPLANT
SPONGE GAUZE 2X2 STER 10/PKG (GAUZE/BANDAGES/DRESSINGS)
SPONGE GAUZE 4X4 12PLY STER LF (GAUZE/BANDAGES/DRESSINGS) ×3 IMPLANT
SPONGE SCRUB IODOPHOR (GAUZE/BANDAGES/DRESSINGS) IMPLANT
SUCTION FRAZIER HANDLE 10FR (MISCELLANEOUS) ×2
SUCTION TUBE FRAZIER 10FR DISP (MISCELLANEOUS) ×1 IMPLANT
SUT ETHILON 2 0 FS 18 (SUTURE) ×3 IMPLANT
SUT ETHILON 4 0 PS 2 18 (SUTURE) ×3 IMPLANT
TOWEL OR 17X24 6PK STRL BLUE (TOWEL DISPOSABLE) ×3 IMPLANT
TOWEL OR 17X26 10 PK STRL BLUE (TOWEL DISPOSABLE) ×3 IMPLANT
TUBE CONNECTING 12'X1/4 (SUCTIONS)
TUBE CONNECTING 12X1/4 (SUCTIONS) IMPLANT
UNDERPAD 30X30 (UNDERPADS AND DIAPERS) ×3 IMPLANT
WATER STERILE IRR 1000ML POUR (IV SOLUTION) IMPLANT
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
## 2016-11-22 NOTE — Progress Notes (Signed)
Subjective: continued pain over left great toe.   Objective: Vital signs in last 24 hours: Temp:  [98.6 F (37 C)-99.2 F (37.3 C)] 98.6 F (37 C) (12/06 0559) Pulse Rate:  [68-70] 68 (12/06 0559) Resp:  [18-19] 19 (12/06 0559) BP: (94-107)/(58-65) 107/65 (12/06 0559) SpO2:  [96 %-99 %] 99 % (12/06 0559)  Intake/Output from previous day: 12/05 0701 - 12/06 0700 In: 860 [P.O.:360; I.V.:450; IV Piggyback:50] Out: -  Intake/Output this shift: No intake/output data recorded.   Recent Labs  11/20/16 1026 11/21/16 0712  HGB 14.2 12.3*    Recent Labs  11/20/16 1026 11/21/16 0712  WBC 10.2 9.1  RBC 4.86 4.28  HCT 42.5 36.8*  PLT 211 178    Recent Labs  11/21/16 0712 11/22/16 0415  NA 133* 134*  K 4.2 5.1  CL 100* 101  CO2 25 24  BUN 11 12  CREATININE 1.04 1.03  GLUCOSE 251* 266*  CALCIUM 8.8* 9.0    Recent Labs  11/21/16 0712  INR 1.15    Large area of necrotic tissue over the plantar aspect of the toe.  Assessment/Plan: 50 year old male with osteomyelitis of the distal phalanx of the great toe and complex wound which encompasses well past the IP joint of the great toe on the plantar aspect. Significant surrounding cellulitis.//With a long discussion of treatment options. At this point I think amputation C appropriate course of action. The patient has been evaluated by the vascular surgery service and they feel that amputation is appropriate and based on bleeding at the time of surgery and wound healing would consider additional intervention including arteriogram if he were not to have good blood supply or did not heal. At this point about taking the operating room for great toe amputation.   Bobak Oguinn L 11/22/2016, 8:18 AM

## 2016-11-22 NOTE — Progress Notes (Addendum)
Patient arrived from PACU. VSS. Alert and Oriented x4. Patient denies pain or sensation at surgical site. LLE warm. Dressing clean dry and intact.   Patient asking for Brilinta medication that he takes as a study with a cardiologist here. Dr. Elisabeth Pigeon paged. Patient asking to take medication.   10:16 AM Report given to patient's RN Okey Regal

## 2016-11-22 NOTE — Transfer of Care (Signed)
Immediate Anesthesia Transfer of Care Note  Patient: Austin Arnold  Procedure(s) Performed: Procedure(s): AMPUTATION FOOT LEFT GREAT TOE (Left)  Patient Location: PACU  Anesthesia Type:MAC  Level of Consciousness: awake, alert , oriented and patient cooperative  Airway & Oxygen Therapy: Patient Spontanous Breathing  Post-op Assessment: Report given to RN and Post -op Vital signs reviewed and stable  Post vital signs: Reviewed and stable  Last Vitals:  Vitals:   11/21/16 2144 11/22/16 0559  BP: (!) 101/58 107/65  Pulse: 68 68  Resp: 19 19  Temp: 37.1 C 37 C    Last Pain:  Vitals:   11/22/16 0559  TempSrc: Oral  PainSc:       Patients Stated Pain Goal: 0 (11/22/16 0430)  Complications: No apparent anesthesia complications

## 2016-11-22 NOTE — Progress Notes (Signed)
Orthopedic Tech Progress Note Patient Details:  Austin Arnold Jul 16, 1966 [id number]  Ortho Devices Type of Ortho Device: Postop shoe/boot Ortho Device/Splint Location: lle Ortho Device/Splint Interventions: Application   Chelle Cayton 11/22/2016, 11:22 AM

## 2016-11-22 NOTE — Anesthesia Procedure Notes (Signed)
Procedure Name: MAC Date/Time: 11/22/2016 8:40 AM Performed by: Rosiland Oz Pre-anesthesia Checklist: Patient identified, Emergency Drugs available, Suction available, Patient being monitored and Timeout performed Patient Re-evaluated:Patient Re-evaluated prior to inductionOxygen Delivery Method: Nasal cannula

## 2016-11-22 NOTE — Anesthesia Postprocedure Evaluation (Signed)
Anesthesia Post Note  Patient: Conservator, museum/gallery  Procedure(s) Performed: Procedure(s) (LRB): AMPUTATION FOOT LEFT GREAT TOE (Left)  Patient location during evaluation: PACU Anesthesia Type: MAC and Regional Level of consciousness: awake and alert Pain management: pain level controlled Vital Signs Assessment: post-procedure vital signs reviewed and stable Respiratory status: spontaneous breathing, nonlabored ventilation, respiratory function stable and patient connected to nasal cannula oxygen Cardiovascular status: stable and blood pressure returned to baseline Anesthetic complications: no    Last Vitals:  Vitals:   11/22/16 0935 11/22/16 1003  BP:  118/62  Pulse:  72  Resp:  18  Temp: 36.7 C 36.4 C    Last Pain:  Vitals:   11/22/16 1003  TempSrc: Oral  PainSc:                  Kennieth Rad

## 2016-11-22 NOTE — Progress Notes (Signed)
Results for EL, PILE (MRN [id number]) as of 11/22/2016 12:28  Ref. Range 11/21/2016 16:43 11/21/2016 21:37 11/22/2016 09:18 11/22/2016 09:59 11/22/2016 12:08  Glucose-Capillary Latest Ref Range: 65 - 99 mg/dL 403 (H) 754 (H) 360 (H) 222 (H) 371 (H)  Noted that blood sugars continue to be greater than 180 mg/dl. Recommend increasing Lantus to 20-25 units daily and increasing Novolog correction scale to MODERATE TID & HS if blood sugars continue to be elevated. Smith Mince RN BSN CDE

## 2016-11-22 NOTE — Anesthesia Procedure Notes (Signed)
Anesthesia Regional Block:  Popliteal block  Pre-Anesthetic Checklist: ,, timeout performed, Correct Patient, Correct Site, Correct Laterality, Correct Procedure, Correct Position, site marked, Risks and benefits discussed,  Surgical consent,  Pre-op evaluation,  At surgeon's request and post-op pain management  Laterality: Left  Prep: chloraprep       Needles:  Injection technique: Single-shot  Needle Type: Echogenic Needle     Needle Length: 9cm 9 cm Needle Gauge: 21 and 21 G    Additional Needles:  Procedures: ultrasound guided (picture in chart) Popliteal block Narrative:  Start time: 11/22/2016 8:07 AM End time: 11/22/2016 8:14 AM Injection made incrementally with aspirations every 5 mL.  Performed by: Personally  Anesthesiologist: Marcene Duos

## 2016-11-22 NOTE — Brief Op Note (Signed)
11/20/2016 - 11/22/2016  10:48 AM  PATIENT:  Austin Arnold  50 y.o. male  PRE-OPERATIVE DIAGNOSIS:  INFECTION  POST-OPERATIVE DIAGNOSIS:  INFECTION  PROCEDURE:  Procedure(s): AMPUTATION FOOT LEFT GREAT TOE (Left)  SURGEON:  Surgeon(s) and Role:    * Jodi Geralds, MD - Primary  PHYSICIAN ASSISTANT:   ASSISTANTS: buthine PAC   ANESTHESIA:   general  EBL:  Total I/O In: 740 [P.O.:240; I.V.:500] Out: 10 [Blood:10]  BLOOD ADMINISTERED:none  DRAINS: none   LOCAL MEDICATIONS USED:  NONE  SPECIMEN:  No Specimen  DISPOSITION OF SPECIMEN:  N/A  COUNTS:  YES  TOURNIQUET:    DICTATION: .Other Dictation: Dictation Number [id number]  PLAN OF CARE: Admit to inpatient   PATIENT DISPOSITION:  PACU - hemodynamically stable.   Delay start of Pharmacological VTE agent (>24hrs) due to surgical blood loss or risk of bleeding: no

## 2016-11-22 NOTE — Progress Notes (Addendum)
Patient ID: Austin Arnold, male   DOB: 1966/01/24, 50 y.o.   MRN: [id number]  PROGRESS NOTE    Aristides Luckey  RSW:[id number] DOB: Feb 04, 1966 DOA: 11/20/2016  PCP: [name], MD   Brief Narrative:  50 y.o.malewith a past medical history remarkable for diabetes, rheumatoid arthritis on chronic prednisone. Pt presented to ED with progressive left great toe pain with purulence. The patient had been treated for presumed osteomyelitis with 6 days of doxycycline as an outpatient without improvement in his symptoms. The patient reported having had a prior history of Pseudomonas infection on the right foot with similar symptoms treated with antibiotics successfully.   In addition, pt complained of non-resolving chest discomfort with some shortness of breath. He has had cardiac catheterization with stent in June of this year and 2D echo did not show any changes.  On admission,left foot XR consistent with 1st distant phalanx osteomyelitis. MRI foot showed findings consistent with osteomyelitis throughout the distal phalanx of the great toe. Associated abscess formation surrounding the tip of the distal phalanx was identified. Negative for septic joint. Pt was seen by ortho in consultation.  Assessment & Plan:  Left great toe pain secondary to acute osteomyelitis due to poorly controlled diabetes  - Left foot XR consistent with 1st distant phalanx osteomyelitis.  - MRI foot showed findings consistent with osteomyelitis throughout the distal phalanx of the great toe. Associated abscess formation surrounding the tip of the distal phalanx was identified. Negative for septic joint. 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 - Hgb A1C is 10.9 - Per DM coordinator, increase Lantus to 20 units and add moderate SSI  Essential hypertension - Continue Avapro and metoprolol   Dyslipidemia associated with type 2 DM - Continue Crestor   Chronic systolic and diastolic heart failure/ ICM - Last echocardiogram 01/2016 with EF 45-50, grade 2 DD - Compensated   CAD, s/p failed CABG 01/2016/ history of PVD - No acute ischemic changes on 12 lead EKG - No further chest pain - Trop WNL - Resume Brilinta   Bipolar disorder/ Depression - Continue current meds    DVT prophylaxis: Brilinta Code Status: full code  Family Communication: no family at the bedside this am Disposition Plan: home once cleared by ortho   Consultants:   Orthopedics  Vascular surgery   PT  DM coordinator   Procedures:   None   Antimicrobials:   Zosyn 12/4>12/5  Vancomycin 12/4>12/5  Cefepime 12/5>   Subjective: No overnight events.   Objective: Vitals:   11/22/16 0933 11/22/16 0935 11/22/16 1003 11/22/16 1502  BP: 118/72  118/62 129/69  Pulse: 74  72 72  Resp: 17  18 19   Temp:  98 F (36.7 C) 97.5 F (36.4 C) 98.2 F (36.8 C)  TempSrc:   Oral Oral  SpO2: 100%  100% 100%  Weight:      Height:        Intake/Output Summary (Last 24 hours) at 11/22/16 1618 Last data filed at 11/22/16 1449  Gross per 24 hour  Intake  1240 ml  Output             1011 ml  Net              229 ml   Filed Weights   11/20/16 1027 11/20/16 1355  Weight: 105.2 kg (232 lb) 108.7 kg (239 lb 9.6 oz)    Examination:  General exam: Appears calm and comfortable  Respiratory system: Clear to auscultation. Respiratory effort normal. Cardiovascular system: S1 & S2 heard, RRR. No JVD, murmurs, rubs, gallops or clicks. No pedal edema. Gastrointestinal  system: Abdomen is nondistended, soft and nontender. No organomegaly or masses felt. Normal bowel sounds heard. Central nervous system: Alert and oriented. No focal neurological deficits. Extremities: Symmetric 5 x 5 power. Skin: dressing in place Psychiatry: Judgement and insight appear normal. Mood & affect appropriate.   Data Reviewed: I have personally reviewed following labs and imaging studies  CBC:  Recent Labs Lab 11/20/16 1026 11/21/16 0712  WBC 10.2 9.1  HGB 14.2 12.3*  HCT 42.5 36.8*  MCV 87.4 86.0  PLT 211 178   Basic Metabolic Panel:  Recent Labs Lab 11/20/16 1026 11/21/16 0712 11/22/16 0415  NA 135 133* 134*  K 5.7* 4.2 5.1  CL 101 100* 101  CO2 25 25 24   GLUCOSE 273* 251* 266*  BUN 13 11 12   CREATININE 1.06 1.04 1.03  CALCIUM 9.6 8.8* 9.0   GFR: Estimated Creatinine Clearance: 111.4 mL/min (by C-G formula based on SCr of 1.03 mg/dL). Liver Function Tests:  Recent Labs Lab 11/20/16 1026 11/21/16 0712  AST 19 15  ALT 12* 12*  ALKPHOS 75 63  BILITOT 1.0 0.6  PROT 7.3 6.1*  ALBUMIN 3.2* 2.6*   No results for input(s): LIPASE, AMYLASE in the last 168 hours. No results for input(s): AMMONIA in the last 168 hours. Coagulation Profile:  Recent Labs Lab 11/21/16 0712  INR 1.15   Cardiac Enzymes: No results for input(s): CKTOTAL, CKMB, CKMBINDEX, TROPONINI in the last 168 hours. BNP (last 3 results) No results for input(s): PROBNP in the last 8760 hours. HbA1C:  Recent Labs  11/20/16 1330  HGBA1C 10.9*   CBG:  Recent Labs Lab 11/21/16 1643 11/21/16 2137 11/22/16 0918 11/22/16 0959 11/22/16 1208  GLUCAP 298* 234* 215* 222* 371*   Lipid Profile: No results for input(s): CHOL, HDL, LDLCALC, TRIG, CHOLHDL, LDLDIRECT in the last 72 hours. Thyroid Function Tests: No results for input(s): TSH, T4TOTAL, FREET4, T3FREE, THYROIDAB in the last 72 hours. Anemia Panel: No results for input(s): VITAMINB12, FOLATE, FERRITIN, TIBC, IRON,  RETICCTPCT in the last 72 hours. Urine analysis:    Component Value Date/Time   COLORURINE YELLOW 01/15/2016 1719   APPEARANCEUR CLEAR 01/15/2016 1719   LABSPEC 1.017 01/15/2016 1719   PHURINE 5.5 01/15/2016 1719   GLUCOSEU >1000 (A) 01/15/2016 1719   HGBUR MODERATE (A) 01/15/2016 1719   BILIRUBINUR NEGATIVE 01/15/2016 1719   KETONESUR NEGATIVE 01/15/2016 1719   PROTEINUR NEGATIVE 01/15/2016 1719   NITRITE NEGATIVE 01/15/2016 1719   LEUKOCYTESUR NEGATIVE 01/15/2016 1719   Sepsis Labs: @LABRCNTIP (procalcitonin:4,lacticidven:4)   Culture, blood (Routine X 2) w Reflex to ID Panel     Status: Abnormal (Preliminary result)   Collection Time: 11/20/16  1:30 PM  Result Value Ref Range Status   Specimen Description BLOOD LEFT ANTECUBITAL  Final   Special Requests   Final    BOTTLES DRAWN AEROBIC AND ANAEROBIC  10CC AER AND 5CC ANA   Culture  Setup Time   Final  Culture (A)  Final    GROUP B STREP(S.AGALACTIAE)ISOLATED SUSCEPTIBILITIES TO FOLLOW    Report Status PENDING  Incomplete  Blood Culture ID Panel (Reflexed)     Status: Abnormal   Collection Time: 11/20/16  1:30 PM  Result Value Ref Range Status   Enterococcus species NOT DETECTED NOT DETECTED Final   Listeria monocytogenes NOT DETECTED NOT DETECTED Final   Staphylococcus species NOT DETECTED NOT DETECTED Final   Staphylococcus aureus NOT DETECTED NOT DETECTED Final   Streptococcus species DETECTED (A) NOT DETECTED Final   Streptococcus agalactiae DETECTED (A) NOT DETECTED Final   Streptococcus pneumoniae NOT DETECTED NOT DETECTED Final   Streptococcus pyogenes NOT DETECTED NOT DETECTED Final   Acinetobacter baumannii NOT DETECTED NOT DETECTED Final   Enterobacteriaceae species NOT DETECTED NOT DETECTED Final   Enterobacter cloacae complex NOT DETECTED NOT DETECTED Final   Escherichia coli NOT DETECTED NOT DETECTED Final   Klebsiella oxytoca NOT DETECTED NOT DETECTED Final   Klebsiella pneumoniae NOT DETECTED NOT  DETECTED Final   Proteus species NOT DETECTED NOT DETECTED Final   Serratia marcescens NOT DETECTED NOT DETECTED Final   Haemophilus influenzae NOT DETECTED NOT DETECTED Final   Neisseria meningitidis NOT DETECTED NOT DETECTED Final   Pseudomonas aeruginosa NOT DETECTED NOT DETECTED Final   Candida albicans NOT DETECTED NOT DETECTED Final   Candida glabrata NOT DETECTED NOT DETECTED Final   Candida krusei NOT DETECTED NOT DETECTED Final   Candida parapsilosis NOT DETECTED NOT DETECTED Final   Candida tropicalis NOT DETECTED NOT DETECTED Final  Culture, blood (Routine X 2) w Reflex to ID Panel     Status: Abnormal (Preliminary result)   Collection Time: 11/20/16  1:43 PM  Result Value Ref Range Status   Specimen Description BLOOD LEFT HAND  Final   Special Requests BOTTLES DRAWN AEROBIC ONLY  10CC  Final   Culture  Setup Time   Final   Culture (A)  Final    GROUP B STREP(S.AGALACTIAE)ISOLATED    Report Status PENDING  Incomplete  Surgical pcr screen     Status: None   Collection Time: 11/22/16 12:30 AM  Result Value Ref Range Status   MRSA, PCR NEGATIVE NEGATIVE Final   Staphylococcus aureus NEGATIVE NEGATIVE Final      Radiology Studies: Dg Chest 2 View Result Date: 11/20/2016 No radiographic evidence of acute cardiopulmonary disease. Surgical changes of prior median sternotomy and CABG. Signed, Yvone Neu. Loreta Ave, DO [facility] Electronically Signed   By: Gilmer Mor D.O.   On: 11/20/2016 11:47   Mr Foot Left Wo Contrast Result Date: 11/21/2016 Findings consistent with osteomyelitis throughout the distal phalanx of the great toe. Associated abscess formation surrounding the tip of the distal phalanx is identified as described above. Wound in the plantar soft tissues of the great toe with a possible underlying abscess is also identified. Negative for septic joint. Advanced first MTP osteoarthritis. Milder degree of fourth  tarsometatarsal degenerative change is seen.  Dg Foot Complete Left Result Date: 11/20/2016 Lytic destruction of distal tuft of first distal phalanx consistent with osteomyelitis. Degenerative and posttraumatic changes are noted as well.     Scheduled Meds: . ceFEPime (MAXIPIME) IV  2 g Intravenous Q12H  . enoxaparin (LOVENOX) injection  50 mg Subcutaneous Q24H  . insulin aspart  0-15 Units Subcutaneous TID WC  . insulin glargine  20 Units Subcutaneous QHS  . irbesartan  75 mg Oral Daily  . isosorbide mononitrate  30 mg  Oral BID  . lithium  600 mg Oral Daily  . metoprolol  50 mg Oral BID  . predniSONE  5 mg Oral BID  . ranolazine  500 mg Oral BID  . rosuvastatin  40 mg Oral Daily  . sertraline  100 mg Oral Daily  . sodium chloride flush  3 mL Intravenous Q12H  . ticagrelor  90 mg Oral BID  . traZODone  100 mg Oral QHS   Continuous Infusions: . sodium chloride 50 mL/hr at 11/22/16 1008     LOS: 2 days    Time spent: 25 minutes  Greater than 50% of the time spent on counseling and coordinating the care.   Manson PasseyEVINE, Lochlann Mastrangelo, MD [facility] Pager [phone]  If 7PM-7AM, please contact night-coverage [email or web address] Password [email or web address] 11/22/2016, 4:18 PM

## 2016-11-23 ENCOUNTER — Encounter (HOSPITAL_COMMUNITY): Payer: Self-pay | Admitting: Orthopedic Surgery

## 2016-11-23 DIAGNOSIS — I10 Essential (primary) hypertension: Secondary | ICD-10-CM

## 2016-11-23 DIAGNOSIS — Z89412 Acquired absence of left great toe: Secondary | ICD-10-CM

## 2016-11-23 DIAGNOSIS — E875 Hyperkalemia: Secondary | ICD-10-CM

## 2016-11-23 LAB — CBC
HEMATOCRIT: 39.1 % (ref 39.0–52.0)
HEMOGLOBIN: 12.7 g/dL — AB (ref 13.0–17.0)
MCH: 27.9 pg (ref 26.0–34.0)
MCHC: 32.5 g/dL (ref 30.0–36.0)
MCV: 85.9 fL (ref 78.0–100.0)
Platelets: 207 10*3/uL (ref 150–400)
RBC: 4.55 MIL/uL (ref 4.22–5.81)
RDW: 12.5 % (ref 11.5–15.5)
WBC: 8.3 10*3/uL (ref 4.0–10.5)

## 2016-11-23 LAB — BASIC METABOLIC PANEL
ANION GAP: 8 (ref 5–15)
BUN: 9 mg/dL (ref 6–20)
CHLORIDE: 102 mmol/L (ref 101–111)
CO2: 26 mmol/L (ref 22–32)
CREATININE: 0.9 mg/dL (ref 0.61–1.24)
Calcium: 9 mg/dL (ref 8.9–10.3)
GFR calc non Af Amer: >60 mL/min (ref >60)
Glucose, Bld: 279 mg/dL — ABNORMAL HIGH (ref 65–99)
Potassium: 4.5 mmol/L (ref 3.5–5.1)
SODIUM: 136 mmol/L (ref 135–145)

## 2016-11-23 LAB — CULTURE, BLOOD (ROUTINE X 2)

## 2016-11-23 LAB — LIPID PANEL
CHOL/HDL RATIO: 3.4 ratio
Cholesterol: 72 mg/dL (ref 0–200)
HDL: 21 mg/dL — AB (ref >40)
LDL CALC: 13 mg/dL (ref 0–99)
Triglycerides: 192 mg/dL — ABNORMAL HIGH (ref <150)
VLDL: 38 mg/dL (ref 0–40)

## 2016-11-23 LAB — GLUCOSE, CAPILLARY
GLUCOSE-CAPILLARY: 222 mg/dL — AB (ref 65–99)
GLUCOSE-CAPILLARY: 259 mg/dL — AB (ref 65–99)
GLUCOSE-CAPILLARY: 333 mg/dL — AB (ref 65–99)
Glucose-Capillary: 199 mg/dL — ABNORMAL HIGH (ref 65–99)

## 2016-11-23 MED ORDER — HYDROMORPHONE HCL 2 MG/ML IJ SOLN
1.0000 mg | INTRAMUSCULAR | Status: DC | PRN
  Administered 2016-11-23 – 2016-11-25 (×7): 1 mg via INTRAVENOUS
  Filled 2016-11-23 (×8): qty 1

## 2016-11-23 MED ORDER — HYDROMORPHONE HCL 2 MG/ML IJ SOLN
1.0000 mg | INTRAMUSCULAR | Status: DC | PRN
  Administered 2016-11-23: 1 mg via INTRAVENOUS
  Filled 2016-11-23: qty 1

## 2016-11-23 MED ORDER — INSULIN GLARGINE 100 UNIT/ML ~~LOC~~ SOLN
25.0000 [IU] | Freq: Every day | SUBCUTANEOUS | Status: DC
  Administered 2016-11-23 – 2016-11-24 (×2): 25 [IU] via SUBCUTANEOUS
  Filled 2016-11-23 (×3): qty 0.25

## 2016-11-23 NOTE — Progress Notes (Signed)
Patient ID: Austin Arnold, male   DOB: Feb 24, 1966, 50 y.o.   MRN: [id number]  PROGRESS NOTE    Corbitt Cloke  BJS:[id number] DOB: May 11, 1966 DOA: 11/20/2016  PCP: [name], MD   Brief Narrative:  50 y.o.malewith a past medical history remarkable for diabetes, rheumatoid arthritis on chronic prednisone. Pt presented to ED with progressive left great toe pain with purulence. The patient had been treated for presumed osteomyelitis with 6 days of doxycycline as an outpatient without improvement in his symptoms. The patient reported having had a prior history of Pseudomonas infection on the right foot with similar symptoms treated with antibiotics successfully.   In addition, pt complained of non-resolving chest discomfort with some shortness of breath. He has had cardiac catheterization with stent in June of this year and 2D echo did not show any changes.  On admission,left foot XR consistent with 1st distant phalanx osteomyelitis. MRI foot showed findings consistent with osteomyelitis throughout the distal phalanx of the great toe. Associated abscess formation surrounding the tip of the distal phalanx was identified. Negative for septic joint. Pt was seen by ortho in consultation and underwent left great toa amputation 11/22/2016.  Assessment & Plan:  Left great toe pain secondary to acute osteomyelitis due to poorly controlled diabetes  - Left foot XR consistent with 1st distant phalanx osteomyelitis.  - MRI foot showed findings consistent with osteomyelitis throughout the distal phalanx of the great toe. Associated abscess formation surrounding the tip of the distal phalanx was identified. Negative for septic joint. 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 with long term insulin use - Hgb A1C is 10.9 - Per DM coordinator, ordered Lantus to 20 units and moderate SSI - CBG's in past 24 hours: 254, 239, 222 - Will increase Lantus to 25 units for better CBG control   Essential hypertension - Continue Avapro and metoprolol  - Continue imdur   Dyslipidemia associated with type 2 DM - Continue Crestor   Chronic systolic and diastolic heart failure/ ICM - Last echocardiogram 01/2016 with EF 45-50, grade 2 DD - Compensated   CAD s/p CABG, 6 months s/p PCI-DES LCX / History of PVD - No acute ischemic changes on 12 lead EKG - No further chest pain - Trop WNL - Resumed Brilinta 12/6 - Seen by cardio in consultation   Bipolar disorder/ Depression - Continue Zoloft, trazodone, lithium   Rheumatoid arthritis - Continue prednisone    DVT prophylaxis: Brilinta; Lovenox subQ Code Status: full code  Family Communication: no family at the bedside this am Disposition Plan: home once cleared by ortho   Consultants:   Orthopedics  Vascular surgery   PT  DM coordinator   Cardiology 12/6  Procedures:   None   Antimicrobials:   Zosyn 12/4>12/5  Vancomycin 12/4>12/5  Cefepime 12/5>   Subjective: No overnight events.   Objective: Vitals:   11/22/16 1003 11/22/16 1502 11/22/16 2201 11/23/16 0611  BP: 118/62 129/69 127/70 130/63  Pulse: 72 72 70 71  Resp: 18 19 19 19   Temp: 97.5 F (36.4 C) 98.2 F (  36.8 C) 98.6 F (37 C) 99.3 F (37.4 C)  TempSrc: Oral Oral Oral Oral  SpO2: 100% 100% 97% 99%  Weight:      Height:        Intake/Output Summary (Last 24 hours) at 11/23/16 1004 Last data filed at 11/23/16 7672  Gross per 24 hour  Intake             1751 ml  Output             1951 ml  Net              -200 ml   Filed Weights   11/20/16 1027 11/20/16 1355  Weight: 105.2 kg (232 lb) 108.7 kg (239 lb 9.6 oz)    Examination:  General exam: Appears calm and comfortable, no distress  Respiratory system: No wheezing, no rhonchi  Cardiovascular system: S1 & S2 heard, Rate controlled  Gastrointestinal system: (+) BS, non tender abdomen  Central nervous system: No focal neurological deficits. Extremities: no tenderness, palpable pulses  Skin: dressing in place left foot  Psychiatry: Mood & affect appropriate.   Data Reviewed: I have personally reviewed following labs and imaging studies  CBC:  Recent Labs Lab 11/20/16 1026 11/21/16 0712 11/23/16 0500  WBC 10.2 9.1 8.3  HGB 14.2 12.3* 12.7*  HCT 42.5 36.8* 39.1  MCV 87.4 86.0 85.9  PLT 211 178 207   Basic Metabolic Panel:  Recent Labs Lab 11/20/16 1026 11/21/16 0712 11/22/16 0415 11/23/16 0500  NA 135 133* 134* 136  K 5.7* 4.2 5.1 4.5  CL 101 100* 101 102  CO2 25 25 24 26   GLUCOSE 273* 251* 266* 279*  BUN 13 11 12 9   CREATININE 1.06 1.04 1.03 0.90  CALCIUM 9.6 8.8* 9.0 9.0   GFR: Estimated Creatinine Clearance: 127.5 mL/min (by C-G formula based on SCr of 0.9 mg/dL). Liver Function Tests:  Recent Labs Lab 11/20/16 1026 11/21/16 0712  AST 19 15  ALT 12* 12*  ALKPHOS 75 63  BILITOT 1.0 0.6  PROT 7.3 6.1*  ALBUMIN 3.2* 2.6*   No results for input(s): LIPASE, AMYLASE in the last 168 hours. No results for input(s): AMMONIA in the last 168 hours. Coagulation Profile:  Recent Labs Lab 11/21/16 0712  INR 1.15   Cardiac Enzymes: No results for input(s): CKTOTAL, CKMB, CKMBINDEX, TROPONINI in the last 168 hours. BNP (last 3 results) No results for input(s): PROBNP in the last 8760 hours. HbA1C:  Recent Labs  11/20/16 1330  HGBA1C 10.9*   CBG:  Recent Labs Lab 11/22/16 0959 11/22/16 1208 11/22/16 1645 11/22/16 2213 11/23/16 0753  GLUCAP 222* 371* 254* 239* 222*   Lipid Profile:  Recent  Labs  11/23/16 0500  CHOL 72  HDL 21*  LDLCALC 13  TRIG 14/07/17*  CHOLHDL 3.4   Thyroid Function Tests: No results for input(s): TSH, T4TOTAL, FREET4, T3FREE, THYROIDAB in the last 72 hours. Anemia Panel: No results for input(s): VITAMINB12, FOLATE, FERRITIN, TIBC, IRON, RETICCTPCT in the last 72 hours. Urine analysis:    Component Value Date/Time   COLORURINE YELLOW 01/15/2016 1719   APPEARANCEUR CLEAR 01/15/2016 1719   LABSPEC 1.017 01/15/2016 1719   PHURINE 5.5 01/15/2016 1719   GLUCOSEU >1000 (A) 01/15/2016 1719   HGBUR MODERATE (A) 01/15/2016 1719   BILIRUBINUR NEGATIVE 01/15/2016 1719   KETONESUR NEGATIVE 01/15/2016 1719   PROTEINUR NEGATIVE 01/15/2016 1719   NITRITE NEGATIVE 01/15/2016 1719   LEUKOCYTESUR NEGATIVE 01/15/2016 1719   Sepsis Labs: @LABRCNTIP (procalcitonin:4,lacticidven:4)  Culture, blood (Routine X 2) w Reflex to ID Panel     Status: Abnormal (Preliminary result)   Collection Time: 11/20/16  1:30 PM  Result Value Ref Range Status   Specimen Description BLOOD LEFT ANTECUBITAL  Final   Special Requests   Final    BOTTLES DRAWN AEROBIC AND ANAEROBIC  10CC AER AND 5CC ANA   Culture  Setup Time   Final   Culture (A)  Final    GROUP B STREP(S.AGALACTIAE)ISOLATED SUSCEPTIBILITIES TO FOLLOW    Report Status PENDING  Incomplete  Blood Culture ID Panel (Reflexed)     Status: Abnormal   Collection Time: 11/20/16  1:30 PM  Result Value Ref Range Status   Enterococcus species NOT DETECTED NOT DETECTED Final   Listeria monocytogenes NOT DETECTED NOT DETECTED Final   Staphylococcus species NOT DETECTED NOT DETECTED Final   Staphylococcus aureus NOT DETECTED NOT DETECTED Final   Streptococcus species DETECTED (A) NOT DETECTED Final   Streptococcus agalactiae DETECTED (A) NOT DETECTED Final   Streptococcus pneumoniae NOT DETECTED NOT DETECTED Final   Streptococcus pyogenes NOT DETECTED NOT DETECTED Final   Acinetobacter baumannii NOT DETECTED NOT DETECTED Final    Enterobacteriaceae species NOT DETECTED NOT DETECTED Final   Enterobacter cloacae complex NOT DETECTED NOT DETECTED Final   Escherichia coli NOT DETECTED NOT DETECTED Final   Klebsiella oxytoca NOT DETECTED NOT DETECTED Final   Klebsiella pneumoniae NOT DETECTED NOT DETECTED Final   Proteus species NOT DETECTED NOT DETECTED Final   Serratia marcescens NOT DETECTED NOT DETECTED Final   Haemophilus influenzae NOT DETECTED NOT DETECTED Final   Neisseria meningitidis NOT DETECTED NOT DETECTED Final   Pseudomonas aeruginosa NOT DETECTED NOT DETECTED Final   Candida albicans NOT DETECTED NOT DETECTED Final   Candida glabrata NOT DETECTED NOT DETECTED Final   Candida krusei NOT DETECTED NOT DETECTED Final   Candida parapsilosis NOT DETECTED NOT DETECTED Final   Candida tropicalis NOT DETECTED NOT DETECTED Final  Culture, blood (Routine X 2) w Reflex to ID Panel     Status: Abnormal (Preliminary result)   Collection Time: 11/20/16  1:43 PM  Result Value Ref Range Status   Specimen Description BLOOD LEFT HAND  Final   Special Requests BOTTLES DRAWN AEROBIC ONLY  10CC  Final   Culture  Setup Time   Final   Culture (A)  Final    GROUP B STREP(S.AGALACTIAE)ISOLATED    Report Status PENDING  Incomplete  Surgical pcr screen     Status: None   Collection Time: 11/22/16 12:30 AM  Result Value Ref Range Status   MRSA, PCR NEGATIVE NEGATIVE Final   Staphylococcus aureus NEGATIVE NEGATIVE Final      Radiology Studies: Dg Chest 2 View Result Date: 11/20/2016 No radiographic evidence of acute cardiopulmonary disease. Surgical changes of prior median sternotomy and CABG. Signed, Yvone Neu. Loreta Ave, DO [facility] Radiology Electronically Signed   By: Gilmer Mor D.O.   On: 11/20/2016 11:47   Mr Foot Left Wo Contrast Result Date: 11/21/2016 Findings consistent with osteomyelitis throughout the distal phalanx of the great toe. Associated abscess  formation surrounding the tip of the distal phalanx is identified as described above. Wound in the plantar soft tissues of the great toe with a possible underlying abscess is also identified. Negative for septic joint. Advanced first MTP osteoarthritis. Milder degree of fourth tarsometatarsal degenerative change is seen.  Dg Foot Complete Left Result Date: 11/20/2016 Lytic destruction of distal tuft of  first distal phalanx consistent with osteomyelitis. Degenerative and posttraumatic changes are noted as well.     Scheduled Meds: . ceFEPime (MAXIPIME) IV  2 g Intravenous Q12H  . enoxaparin (LOVENOX) injection  50 mg Subcutaneous Q24H  . insulin aspart  0-15 Units Subcutaneous TID WC  . insulin glargine  20 Units Subcutaneous QHS  . irbesartan  75 mg Oral Daily  . isosorbide mononitrate  30 mg Oral BID  . lithium  600 mg Oral Daily  . metoprolol  50 mg Oral BID  . predniSONE  5 mg Oral BID  . ranolazine  500 mg Oral BID  . rosuvastatin  40 mg Oral Daily  . sertraline  100 mg Oral Daily  . sodium chloride flush  3 mL Intravenous Q12H  . ticagrelor  90 mg Oral BID  . traZODone  100 mg Oral QHS   Continuous Infusions: . sodium chloride 50 mL/hr at 11/23/16 0500     LOS: 3 days    Time spent: 25 minutes  Greater than 50% of the time spent on counseling and coordinating the care.   Manson Passey, MD [facility] Pager [phone]  If 7PM-7AM, please contact night-coverage [email or web address] Password [email or web address] 11/23/2016, 10:04 AM

## 2016-11-23 NOTE — Progress Notes (Signed)
Subjective: 1 Day Post-Op Procedure(s) (LRB): AMPUTATION FOOT LEFT GREAT TOE (Left) Patient reports pain as moderate. otherwise without complaints.   Objective: Vital signs in last 24 hours: Temp:  [98.2 F (36.8 C)-99.3 F (37.4 C)] 99.3 F (37.4 C) (12/07 1884) Pulse Rate:  [70-72] 71 (12/07 0611) Resp:  [19] 19 (12/07 0611) BP: (127-130)/(63-70) 130/63 (12/07 0611) SpO2:  [97 %-100 %] 99 % (12/07 0611)  Intake/Output from previous day: 12/06 0701 - 12/07 0700 In: 2010 [P.O.:960; I.V.:1000; IV Piggyback:50] Out: 1961 [Urine:1950; Stool:1; Blood:10] Intake/Output this shift: Total I/O In: 241 [P.O.:241] Out: -    Recent Labs  11/21/16 0712 11/23/16 0500  HGB 12.3* 12.7*    Recent Labs  11/21/16 0712 11/23/16 0500  WBC 9.1 8.3  RBC 4.28 4.55  HCT 36.8* 39.1  PLT 178 207    Recent Labs  11/22/16 0415 11/23/16 0500  NA 134* 136  K 5.1 4.5  CL 101 102  CO2 24 26  BUN 12 9  CREATININE 1.03 0.90  GLUCOSE 266* 279*  CALCIUM 9.0 9.0    Recent Labs  11/21/16 0712  INR 1.15  left foot exam: Dressing is clean and dry.  4 toes that remain are not swollen and he moves them actively.  Assessment/Plan: 1 Day Post-Op Procedure(s) (LRB): AMPUTATION FOOT LEFT GREAT TOE (Left) Plan: Continue IV antibiotics. We will change the dressing tomorrow morning and pulled the Penrose drain.   After that if he is stable from a medical viewpoint he can be discharged home on oral antibiotics.  Annalyssa Thune G 11/23/2016, 12:38 PM

## 2016-11-23 NOTE — Evaluation (Signed)
Physical Therapy Evaluation Patient Details Name: Austin Arnold MRN: [id number] DOB: Sep 17, 1966 Today's Date: 11/23/2016   History of Present Illness  Austin Arnold is a 50 y.o. male with PMH significant for chronic back pain, BPD, GERD, RA, poorly controlled DM, chronic stable angina, CAD s/p CABG and PCI DES placement, admitted with L Great toe pain and purulent drainage.  Found to have osteomyelitis in toe s/p amputation.  Clinical Impression  Pt is at or close to baseline functioning and should be safe at home . There are no further acute PT needs.  Will sign off at this time.     Follow Up Recommendations No PT follow up    Equipment Recommendations  None recommended by PT    Recommendations for Other Services       Precautions / Restrictions Precautions Required Braces or Orthoses: Other Brace/Splint (ortho shoe) Restrictions LLE Weight Bearing: Weight bearing as tolerated      Mobility  Bed Mobility Overal bed mobility: Independent                Transfers Overall transfer level: Independent                  Ambulation/Gait Ambulation/Gait assistance: Independent Ambulation Distance (Feet): 400 Feet Assistive device: None Gait Pattern/deviations: WFL(Within Functional Limits)   Gait velocity interpretation: at or above normal speed for age/gender General Gait Details: successful at keeping weight off his toe  Stairs Stairs: Yes   Stair Management: One rail Right;Alternating pattern;Backwards Number of Stairs: 7 General stair comments: safe with rail  Wheelchair Mobility    Modified Rankin (Stroke Patients Only)       Balance Overall balance assessment: Independent   Sitting balance-Leahy Scale: Normal       Standing balance-Leahy Scale: Good                               Pertinent Vitals/Pain Pain Assessment: 0-10 Pain Score: 6  Pain Location: left great toe Pain Descriptors / Indicators: Sore Pain  Intervention(s): Monitored during session    Home Living Family/patient expects to be discharged to:: Private residence Living Arrangements: Spouse/significant other Available Help at Discharge: Family;Available PRN/intermittently Type of Home: House Home Access: Stairs to enter Entrance Stairs-Rails: Doctor, general practice of Steps: several Home Layout: One level Home Equipment: Cane - single point      Prior Function Level of Independence: Independent         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     Higher education careers adviser        Extremity/Trunk Assessment   Upper Extremity Assessment: Overall WFL for tasks assessed           Lower Extremity Assessment: Overall WFL for tasks assessed         Communication      Cognition Arousal/Alertness: Awake/alert Behavior During Therapy: WFL for tasks assessed/performed Overall Cognitive Status: Within Functional Limits for tasks assessed                      General Comments      Exercises     Assessment/Plan    PT Assessment Patent does not need any further PT services  PT Problem List            PT Treatment Interventions      PT Goals (Current goals can be found in the Care Plan section)  Acute Rehab PT Goals PT  Goal Formulation: All assessment and education complete, DC therapy    Frequency     Barriers to discharge        Co-evaluation               End of Session   Activity Tolerance: Patient tolerated treatment well Patient left: in bed;with call bell/phone within reach;with nursing/sitter in room Nurse Communication: Mobility status         Time: 0160-1093 PT Time Calculation (min) (ACUTE ONLY): 14 min   Charges:   PT Evaluation $PT Eval Low Complexity: 1 Procedure     PT G CodesEliseo Gum Maresa Morash 11/23/2016, 4:05 PM  11/23/2016  [place] Bing, PT [phone]  (pager)

## 2016-11-23 NOTE — Op Note (Signed)
[name], [name]             ACCOUNT NO.:  [id number]  MEDICAL RECORD NO.:  [id number]  LOCATION:                                 FACILITY:  PHYSICIAN:  Austin Arnold, M.D.   DATE OF BIRTH:  Nov 06, 1966  DATE OF PROCEDURE:  11/22/2016 DATE OF DISCHARGE:                              OPERATIVE REPORT   PREOPERATIVE DIAGNOSES: 1. Osteomyelitis great toe left distal phalanx with complex wound     covering the distal phalanx and portion of the proximal phalanx. 2. 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  POSTOPERATIVE DIAGNOSES: 1. Osteomyelitis great toe left distal phalanx with complex wound     covering the distal phalanx and portion of the proximal phalanx. 2. 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  PROCEDURE:  Left great toe amputation at the metatarsophalangeal joint.  SURGEON:  Austin Arnold, M.D.  ASSISTANT:  Marshia Ly, P.A.  ANESTHESIA:  General.  BRIEF HISTORY:  Austin Arnold is a 50 year old male with a long history of significant complaints of left foot pain with activity and ambulation.  He more recently developed an open draining wound over this area.  After failure of conservative care, he was admitted to the hospital with cellulitis and suspected osteomyelitis.  We evaluated him and felt that he definitely was going to need an amputation based on the severe nature of the osteomyelitis.  He was admitted for a couple of days of IV antibiotic therapy and we had a long discussion about treatment options.  X-ray and MRI showed that he had a hardly arthritic metatarsophalangeal joint and we had a discussion and felt that we could get the best chance of eradicating the infection by amputating at that level.  We certainly could have amputated more distal but I do not think it would help him and actually probably would hurt him in terms of pain at the metatarsophalangeal joint because  of the severe arthritic problems. So after discussion, we elected to amputate the metatarsophalangeal joint.  He was brought to the operating room for this procedure.  DESCRIPTION OF PROCEDURE:  The patient was brought to the operating room.  After adequate anesthesia was obtained with general anesthetic, the patient was placed supine on the operating room table and the left leg was prepped and draped in usual sterile fashion.  Following this, an incision was made for an amputation of the fifth metatarsal.  Excellent bleeding was identified at the skin edges as well as just globally.  A curved incision was made.  We dissected down to the metatarsophalangeal joint.  We excised the toe in total and once this was completed, we irrigated the wound thoroughly, controlled bleeding with electrocautery, put a Penrose drain and then closed it over this drain with interrupted nylon stitches.  Sterile compressive dressing was applied, and the patient was taken to the recovery where he was noted to be in satisfactory condition.  Estimated blood loss for the procedure was 50 mL but the actual blood loss could be gotten from the anesthetic record.     Austin Arnold, M.D.   ______________________________ Carlisle Beers.  Luiz Blare, M.D.    Ranae Plumber  D:  11/22/2016  T:  11/23/2016  Job:  [id number]

## 2016-11-24 ENCOUNTER — Ambulatory Visit (HOSPITAL_COMMUNITY): Payer: Medicare Other

## 2016-11-24 DIAGNOSIS — R7881 Bacteremia: Secondary | ICD-10-CM

## 2016-11-24 DIAGNOSIS — M86172 Other acute osteomyelitis, left ankle and foot: Secondary | ICD-10-CM

## 2016-11-24 LAB — CBC
HCT: 29.7 % — ABNORMAL LOW (ref 39.0–52.0)
HEMOGLOBIN: 9.9 g/dL — AB (ref 13.0–17.0)
MCH: 28.6 pg (ref 26.0–34.0)
MCHC: 33.3 g/dL (ref 30.0–36.0)
MCV: 85.8 fL (ref 78.0–100.0)
PLATELETS: 157 10*3/uL (ref 150–400)
RBC: 3.46 MIL/uL — AB (ref 4.22–5.81)
RDW: 12.7 % (ref 11.5–15.5)
WBC: 7.4 10*3/uL (ref 4.0–10.5)

## 2016-11-24 LAB — GLUCOSE, CAPILLARY
Glucose-Capillary: 233 mg/dL — ABNORMAL HIGH (ref 65–99)
Glucose-Capillary: 239 mg/dL — ABNORMAL HIGH (ref 65–99)
Glucose-Capillary: 382 mg/dL — ABNORMAL HIGH (ref 65–99)
Glucose-Capillary: 288 mg/dL — ABNORMAL HIGH (ref 65–99)

## 2016-11-24 LAB — ECHOCARDIOGRAM COMPLETE
HEIGHTINCHES: 74 in
WEIGHTICAEL: 3833.6 [oz_av]

## 2016-11-24 MED ORDER — CEFUROXIME AXETIL 500 MG PO TABS
500.0000 mg | ORAL_TABLET | Freq: Two times a day (BID) | ORAL | Status: DC
  Administered 2016-11-24 – 2016-11-25 (×2): 500 mg via ORAL
  Filled 2016-11-24 (×2): qty 1

## 2016-11-24 MED ORDER — HYDROCODONE-ACETAMINOPHEN 5-325 MG PO TABS: 1.0000 | ORAL_TABLET | Freq: Three times a day (TID) | ORAL | 0 refills | Status: DC | PRN

## 2016-11-24 MED ORDER — ACETAMINOPHEN 325 MG PO TABS: 650.0000 mg | ORAL_TABLET | Freq: Four times a day (QID) | ORAL | 0 refills | Status: DC | PRN

## 2016-11-24 NOTE — Progress Notes (Signed)
Subjective: 2 Days Post-Op Procedure(s) (LRB): AMPUTATION FOOT LEFT GREAT TOE (Left) Patient reports pain as moderate.    Objective: Vital signs in last 24 hours: Temp:  [98.4 F (36.9 C)-99.3 F (37.4 C)] 98.4 F (36.9 C) (12/08 0442) Pulse Rate:  [44-71] 62 (12/08 0934) Resp:  [18] 18 (12/08 0442) BP: (128-152)/(78-87) 128/78 (12/08 0442) SpO2:  [100 %] 100 % (12/08 0442)  Intake/Output from previous day: 12/07 0701 - 12/08 0700 In: 1011 [P.O.:961; I.V.:50] Out: -  Intake/Output this shift: Total I/O In: 1140 [P.O.:880; I.V.:260] Out: -    Recent Labs  11/23/16 0500 11/24/16 0432  HGB 12.7* 9.9*    Recent Labs  11/23/16 0500 11/24/16 0432  WBC 8.3 7.4  RBC 4.55 3.46*  HCT 39.1 29.7*  PLT 207 157    Recent Labs  11/22/16 0415 11/23/16 0500  NA 134* 136  K 5.1 4.5  CL 101 102  CO2 24 26  BUN 12 9  CREATININE 1.03 0.90  GLUCOSE 266* 279*  CALCIUM 9.0 9.0   No results for input(s): LABPT, INR in the last 72 hours.  ABD soft Intact pulses distally No cellulitis present wwound looks good without evidence of erythema.   Penrose drain pulled today.  Assessment/Plan: 2 Days Post-Op Procedure(s) (LRB): AMPUTATION FOOT LEFT GREAT TOE (Left) Advance diet Up with therapy Discharge home with home health  I will see the patient back in office in 2 weeks for evaluation of the wound.  I'm hopeful will go on to heal.  It certainly looks good at this point.  Kali Ambler L 11/24/2016, 2:34 PM

## 2016-11-24 NOTE — Progress Notes (Signed)
  Echocardiogram 2D Echocardiogram has been performed.  Austin Arnold 11/24/2016, 2:44 PM

## 2016-11-24 NOTE — Consult Note (Signed)
   [facility] CM Inpatient Consult   11/24/2016  Austin Arnold [birth year] [id number]   Patient was assessed for [place] for community services. Patient was previously active with [facility].  Met with patient at bedside regarding being restarted with [facility] services. Patient states he's had a bit of a rough time. Patient states he would like for community care management follow up.   Consent form is signed and on file a brochure with [place] [facility] information given.  Patient needs assistance with diabetes control.  He has had a left great toe amputation.  Hx of CABG x4.  [facility] Care Management will follow up with discharge calls and evaluate for home visits. Of note, [facility] Care Management services does not replace or interfere with any services that are arranged by inpatient case management or social work. For additional questions or referrals please contact:  Natividad Brood, RN BSN [facility] Liaison  [phone] business mobile phone Toll free office [phone]

## 2016-11-24 NOTE — Progress Notes (Signed)
Patient ID: Austin Arnold, male   DOB: [birth year], 50 y.o.   MRN: [id number]  PROGRESS NOTE    Austin Arnold  Arnold:[id number] DOB: 12-14-1966 DOA: 11/20/2016  PCP: Austin Fick, MD   Brief Narrative:  50 y.o.malewith a past medical history remarkable for diabetes, rheumatoid arthritis on chronic prednisone. Pt presented to ED with progressive left great toe pain with purulence. The patient had been treated for presumed osteomyelitis with 6 days of doxycycline as an outpatient without improvement in his symptoms. The patient reported having had a prior history of Pseudomonas infection on the right foot with similar symptoms treated with antibiotics successfully.   In addition, pt complained of non-resolving chest discomfort with some shortness of breath. He has had cardiac catheterization with stent in June of this year and 2D echo did not show any changes.  On admission,left foot XR consistent with 1st distant phalanx osteomyelitis. MRI foot showed findings consistent with osteomyelitis throughout the distal phalanx of the great toe. Associated abscess formation surrounding the tip of the distal phalanx was identified. Negative for septic joint. Pt was seen by ortho in consultation and underwent left great toa amputation 11/22/2016.  Assessment & Plan:  Left great toe pain secondary to acute osteomyelitis due to poorly controlled diabetes  - Left foot XR consistent with 1st distant phalanx osteomyelitis.  - MRI foot showed findings consistent with osteomyelitis throughout the distal phalanx of the great toe. Associated abscess formation surrounding the tip of the distal phalanx was identified. Negative for septic joint. 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 with long term insulin use - Hgb A1C is 10.9 - Continue Lantus to 25 units and SSI  Essential hypertension - Continue Avapro and metoprolol  - Continue imdur   Dyslipidemia associated with type 2 DM - Continue Crestor   Chronic systolic and diastolic heart failure/ ICM - Last echocardiogram 01/2016 with EF 45-50, grade 2 DD - Compensated   CAD s/p CABG, 6 months s/p PCI-DES LCX / History of PVD - No acute ischemic changes on 12 lead EKG - No further chest pain - Trop WNL - Resumed Brilinta 12/6 - Seen by cardio in consultation   Bipolar disorder/ Depression - Continue Zoloft, trazodone, lithium   Rheumatoid arthritis - Continue prednisone    DVT prophylaxis: Brilinta; Lovenox subQ Code Status: full code  Family Communication: no family at the bedside this am Disposition Plan: home in am   Consultants:   Orthopedics  Vascular surgery   PT  DM coordinator   Cardiology 12/6  Procedures:   None   Antimicrobials:   Zosyn 12/4>12/5  Vancomycin 12/4>12/5  Cefepime 12/5>12/8  Ceftin 12/7>   Subjective: No overnight events.   Objective: Vitals:   11/23/16 1415 11/23/16 2141 11/24/16 0442 11/24/16 0934  BP: (!) 114/44 (!) 152/87 128/78   Pulse: 75 71 (!) 44 62  Resp: 18 18 18    Temp:  98.9 F (37.2 C) 99.3 F (37.4 C) 98.4 F (36.9 C)   TempSrc: Oral Oral Oral   SpO2: 98% 100% 100%   Weight:      Height:        Intake/Output Summary (Last 24 hours) at 11/24/16 1329 Last data filed at 11/24/16 1212  Gross per 24 hour  Intake             1910 ml  Output                0 ml  Net             1910 ml   Filed Weights    11/20/16 1027 11/20/16 1355  Weight: 105.2 kg (232 lb) 108.7 kg (239 lb 9.6 oz)    Examination:  General exam: no distress  Respiratory system: No wheezing, no rhonchi  Cardiovascular system: S1 & S2 heard, RRR Gastrointestinal system: (+) BS, non tender abdomen, non distended   Central nervous system: Nonfocal  Extremities: no tenderness, palpable pulses bilaterally  Skin: dressing in place left foot  Psychiatry: Normal mood and behavior   Data Reviewed: I have personally reviewed following labs and imaging studies  CBC:  Recent Labs Lab 11/20/16 1026 11/21/16 0712 11/23/16 0500 11/24/16 0432  WBC 10.2 9.1 8.3 7.4  HGB 14.2 12.3* 12.7* 9.9*  HCT 42.5 36.8* 39.1 29.7*  MCV 87.4 86.0 85.9 85.8  PLT 211 178 207 157   Basic Metabolic Panel:  Recent Labs Lab 11/20/16 1026 11/21/16 0712 11/22/16 0415 11/23/16 0500  NA 135 133* 134* 136  K 5.7* 4.2 5.1 4.5  CL 101 100* 101 102  CO2 25 25 24 26   GLUCOSE 273* 251* 266* 279*  BUN 13 11 12 9   CREATININE 1.06 1.04 1.03 0.90  CALCIUM 9.6 8.8* 9.0 9.0   GFR: Estimated Creatinine Clearance: 127.5 mL/min (by C-G formula based on SCr of 0.9 mg/dL). Liver Function Tests:  Recent Labs Lab 11/20/16 1026 11/21/16 0712  AST 19 15  ALT 12* 12*  ALKPHOS 75 63  BILITOT 1.0 0.6  PROT 7.3 6.1*  ALBUMIN 3.2* 2.6*   No results for input(s): LIPASE, AMYLASE in the last 168 hours. No results for input(s): AMMONIA in the last 168 hours. Coagulation Profile:  Recent Labs Lab 11/21/16 0712  INR 1.15   Cardiac Enzymes: No results for input(s): CKTOTAL, CKMB, CKMBINDEX, TROPONINI in the last 168 hours. BNP (last 3 results) No results for input(s): PROBNP in the last 8760 hours. HbA1C: No results for input(s): HGBA1C in the last 72 hours. CBG:  Recent Labs Lab 11/23/16 1150 11/23/16 1629 11/23/16 2202 11/24/16 0733 11/24/16 1156  GLUCAP 259* 333* 199* 233* 239*   Lipid Profile:  Recent Labs  11/23/16 0500    CHOL 72  HDL 21*  LDLCALC 13  TRIG 144*  CHOLHDL 3.4   Thyroid Function Tests: No results for input(s): TSH, T4TOTAL, FREET4, T3FREE, THYROIDAB in the last 72 hours. Anemia Panel: No results for input(s): VITAMINB12, FOLATE, FERRITIN, TIBC, IRON, RETICCTPCT in the last 72 hours. Urine analysis:    Component Value Date/Time   COLORURINE YELLOW 01/15/2016 1719   APPEARANCEUR CLEAR 01/15/2016 1719   LABSPEC 1.017 01/15/2016 1719   PHURINE 5.5 01/15/2016 1719   GLUCOSEU >1000 (A) 01/15/2016 1719   HGBUR MODERATE (A) 01/15/2016 1719   BILIRUBINUR NEGATIVE 01/15/2016 1719   KETONESUR NEGATIVE 01/15/2016 1719   PROTEINUR NEGATIVE 01/15/2016 1719   NITRITE NEGATIVE 01/15/2016 1719   LEUKOCYTESUR  NEGATIVE 01/15/2016 1719   Sepsis Labs: @LABRCNTIP (procalcitonin:4,lacticidven:4)   Culture, blood (Routine X 2) w Reflex to ID Panel     Status: Abnormal (Preliminary result)   Collection Time: 11/20/16  1:30 PM  Result Value Ref Range Status   Specimen Description BLOOD LEFT ANTECUBITAL  Final   Special Requests   Final    BOTTLES DRAWN AEROBIC AND ANAEROBIC  10CC AER AND 5CC ANA   Culture  Setup Time   Final   Culture (A)  Final    GROUP B STREP(S.AGALACTIAE)ISOLATED SUSCEPTIBILITIES TO FOLLOW    Report Status PENDING  Incomplete  Blood Culture ID Panel (Reflexed)     Status: Abnormal   Collection Time: 11/20/16  1:30 PM  Result Value Ref Range Status   Enterococcus species NOT DETECTED NOT DETECTED Final   Listeria monocytogenes NOT DETECTED NOT DETECTED Final   Staphylococcus species NOT DETECTED NOT DETECTED Final   Staphylococcus aureus NOT DETECTED NOT DETECTED Final   Streptococcus species DETECTED (A) NOT DETECTED Final   Streptococcus agalactiae DETECTED (A) NOT DETECTED Final   Streptococcus pneumoniae NOT DETECTED NOT DETECTED Final   Streptococcus pyogenes NOT DETECTED NOT DETECTED Final   Acinetobacter baumannii NOT DETECTED NOT DETECTED Final   Enterobacteriaceae  species NOT DETECTED NOT DETECTED Final   Enterobacter cloacae complex NOT DETECTED NOT DETECTED Final   Escherichia coli NOT DETECTED NOT DETECTED Final   Klebsiella oxytoca NOT DETECTED NOT DETECTED Final   Klebsiella pneumoniae NOT DETECTED NOT DETECTED Final   Proteus species NOT DETECTED NOT DETECTED Final   Serratia marcescens NOT DETECTED NOT DETECTED Final   Haemophilus influenzae NOT DETECTED NOT DETECTED Final   Neisseria meningitidis NOT DETECTED NOT DETECTED Final   Pseudomonas aeruginosa NOT DETECTED NOT DETECTED Final   Candida albicans NOT DETECTED NOT DETECTED Final   Candida glabrata NOT DETECTED NOT DETECTED Final   Candida krusei NOT DETECTED NOT DETECTED Final   Candida parapsilosis NOT DETECTED NOT DETECTED Final   Candida tropicalis NOT DETECTED NOT DETECTED Final  Culture, blood (Routine X 2) w Reflex to ID Panel     Status: Abnormal (Preliminary result)   Collection Time: 11/20/16  1:43 PM  Result Value Ref Range Status   Specimen Description BLOOD LEFT HAND  Final   Special Requests BOTTLES DRAWN AEROBIC ONLY  10CC  Final   Culture  Setup Time   Final   Culture (A)  Final    GROUP B STREP(S.AGALACTIAE)ISOLATED    Report Status PENDING  Incomplete  Surgical pcr screen     Status: None   Collection Time: 11/22/16 12:30 AM  Result Value Ref Range Status   MRSA, PCR NEGATIVE NEGATIVE Final   Staphylococcus aureus NEGATIVE NEGATIVE Final      Radiology Studies: Dg Chest 2 View Result Date: 11/20/2016 No radiographic evidence of acute cardiopulmonary disease. Surgical changes of prior median sternotomy and CABG. Signed, 14/03/2016. Yvone Neu, DO [facility] Radiology Electronically Signed   By: [facility] D.O.   On: 11/20/2016 11:47   Mr Foot Left Wo Contrast Result Date: 11/21/2016 Findings consistent with osteomyelitis throughout the distal phalanx of the great toe. Associated abscess formation surrounding the  tip of the distal phalanx is identified as described above. Wound in the plantar soft tissues of the great toe with a possible underlying abscess is also identified. Negative for septic joint. Advanced first MTP osteoarthritis. Milder degree of fourth tarsometatarsal degenerative change is seen.  Dg Foot Complete  Left Result Date: 11/20/2016 Lytic destruction of distal tuft of first distal phalanx consistent with osteomyelitis. Degenerative and posttraumatic changes are noted as well.     Scheduled Meds: . cefUROXime  500 mg Oral BID WC  . enoxaparin (LOVENOX) injection  50 mg Subcutaneous Q24H  . insulin aspart  0-15 Units Subcutaneous TID WC  . insulin glargine  25 Units Subcutaneous QHS  . irbesartan  75 mg Oral Daily  . isosorbide mononitrate  30 mg Oral BID  . lithium  600 mg Oral Daily  . metoprolol  50 mg Oral BID  . predniSONE  5 mg Oral BID  . ranolazine  500 mg Oral BID  . rosuvastatin  40 mg Oral Daily  . sertraline  100 mg Oral Daily  . sodium chloride flush  3 mL Intravenous Q12H  . ticagrelor  90 mg Oral BID  . traZODone  100 mg Oral QHS   Continuous Infusions: . sodium chloride 50 mL/hr at 11/23/16 1927     LOS: 4 days    Time spent: 25 minutes  Greater than 50% of the time spent on counseling and coordinating the care.   Manson Passey, MD [facility] Pager [phone]  If 7PM-7AM, please contact night-coverage [email or web address] Password [email or web address] 11/24/2016, 1:29 PM

## 2016-11-24 NOTE — Progress Notes (Signed)
Inpatient Diabetes Program Recommendations  AACE/ADA: New Consensus Statement on Inpatient Glycemic Control (2015)  Target Ranges:  Prepandial:   less than 140 mg/dL      Peak postprandial:   less than 180 mg/dL (1-2 hours)      Critically ill patients:  140 - 180 mg/dL   Lab Results  Component Value Date   GLUCAP 239 (H) 11/24/2016   HGBA1C 10.9 (H) 11/20/2016    Review of Glycemic Control  FBS and post-prandial blood sugars elevated. Needs insulin adjustment. Eating 100%.  Inpatient Diabetes Program Recommendations:    Increase Lantus to 35 units QHS. Add Novolog 3 units tidwc for meal coverage insulin.  Will continue to follow. Thank you. Ailene Ards, RD, LDN, CDE Inpatient Diabetes Coordinator [phone]

## 2016-11-24 NOTE — Care Management Important Message (Signed)
Important Message  Patient Details  Name: Austin Arnold MRN: [id number] Date of Birth: [birth year]   Medicare Important Message Given:  Yes    [name] 11/24/2016, 12:54 PM

## 2016-11-25 DIAGNOSIS — R7881 Bacteremia: Secondary | ICD-10-CM

## 2016-11-25 LAB — CBC
HCT: 37.4 % — ABNORMAL LOW (ref 39.0–52.0)
Hemoglobin: 12.3 g/dL — ABNORMAL LOW (ref 13.0–17.0)
MCH: 28.1 pg (ref 26.0–34.0)
MCHC: 32.9 g/dL (ref 30.0–36.0)
MCV: 85.6 fL (ref 78.0–100.0)
PLATELETS: 231 10*3/uL (ref 150–400)
RBC: 4.37 MIL/uL (ref 4.22–5.81)
RDW: 12.3 % (ref 11.5–15.5)
WBC: 8.1 10*3/uL (ref 4.0–10.5)

## 2016-11-25 LAB — GLUCOSE, CAPILLARY
GLUCOSE-CAPILLARY: 225 mg/dL — AB (ref 65–99)
GLUCOSE-CAPILLARY: 245 mg/dL — AB (ref 65–99)

## 2016-11-25 MED ORDER — CEFUROXIME AXETIL 500 MG PO TABS: 500.0000 mg | ORAL_TABLET | Freq: Two times a day (BID) | ORAL | 0 refills | Status: AC

## 2016-11-25 MED ORDER — HYDROCODONE-ACETAMINOPHEN 5-325 MG PO TABS: 1.0000 | ORAL_TABLET | Freq: Three times a day (TID) | ORAL | 0 refills | Status: DC | PRN

## 2016-11-25 NOTE — Progress Notes (Signed)
Patient educated on DC instructions and understands and has no questions.  Will monitor for any changes.

## 2016-11-25 NOTE — Discharge Summary (Signed)
Physician Discharge Summary  Austin Arnold AJG:[id number] DOB: 27-Feb-1966 DOA: 11/20/2016  PCP: [name], MD  Admit date: 11/20/2016 Discharge date: 11/25/2016  Recommendations for Outpatient Follow-up:  Continue Ceftin as prescribed for streptococcus bacteremia. Repeat blood cultures showed no growth. Continue insulin regimen per home dose as per prior to this admission. Would recommend holding metformin until infection is clear.  Discharge Diagnoses:  Active Problems:   DM (diabetes mellitus), type 2, uncontrolled with complications (HCC)   Hyperlipidemia   Cardiomyopathy, ischemic-EF 45-50% last echo   Bipolar 1 disorder (HCC)   S/P CABG x 4   Cardiomegaly   Dyspnea   Rheumatoid arthritis (HCC)   Stented coronary artery   Osteomyelitis (HCC)   Great toe pain, left   Other osteomyelitis (HCC)   Osteomyelitis of left foot (HCC)   PVD (peripheral vascular disease) (HCC)    Discharge Condition: stable   Diet recommendation: as tolerated   History of present illness:  50 y.o.malewith a past medical history remarkable for diabetes, rheumatoid arthritis on chronic prednisone. Pt presented to ED with progressive left great toe pain with purulence. The patient had been treated for presumed osteomyelitis with 6 days of doxycycline as an outpatient without improvement in his symptoms. The patient reported having had a prior history of Pseudomonas infection on the right foot with similar symptoms treated with antibiotics successfully.   In addition, pt complained of non-resolving chest discomfort with some shortness of breath. He has had cardiac catheterization with stent in June of this year and 2D echo did not show any changes.  On admission,left foot XR consistent with 1st distant phalanx osteomyelitis. MRI foot showed findings consistent with osteomyelitis throughout the distal phalanx of the great toe. Associated abscess formation surrounding the tip of the distal  phalanx was identified. Negative for septic joint. Pt was seen by ortho in consultation and underwent left great toa amputation 11/22/2016.  Hospital Course:   Assessment & Plan:  Left great toe pain secondary to acute osteomyelitis due to poorly controlled diabetes  - Left foot XR consistent with 1st distant phalanx osteomyelitis.  - MRI foot showed findings consistent with osteomyelitis throughout the distal phalanx of the great toe. Associated abscess formation surrounding the tip of the distal phalanx was identified. Negative for septic joint. 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  - Stopped cefepime and using ceftin 500 BID - total of 21 doses (10 and half days on discharge) - Continue pain management efforts   Acute blood loss anemia - Possible postoperative blood loss - Hgb down from 12.7 --> 9.9 --> 12.3  Streptococcus bacteremia - Blood cx from 12.4 growing strep agalactiae - Repeat blood cx show no growth - Last 2 D ECHO 01/2016 - EF 45% with grade 2 DD - Echo on this admission - no vegetation, no mass  Hyperkalemia - Repeat potassium WNL  Uncontrolled diabetes mellitus with peripheral circulatory complications with long term insulin use - Hgb A1C is 10.9 - Continue insulin regimen per home dosing   Essential hypertension - Continue Avapro and metoprolol  - Continue imdur   Dyslipidemia associated with type 2 DM - Continue Crestor   Chronic systolic and diastolic heart failure/ ICM - ECHO 01/2016 with EF 45-50, grade 2 DD - ECHO on this admission with EF 60%, grade 2 DD - Compensated   CAD s/p CABG, 6 months s/p PCI-DES LCX / History of PVD - No acute ischemic changes on 12 lead EKG -  No further chest pain - Trop WNL - Resumed Brilinta 12/6 - Seen by cardio in consultation   Bipolar disorder/ Depression - Continue Zoloft, trazodone, lithium   Rheumatoid arthritis - Continue prednisone    DVT  prophylaxis: Brilinta; Lovenox subQ Code Status: full code  Family Communication: no family at the bedside this am    Consultants:   Orthopedics  Vascular surgery   PT  DM coordinator   Cardiology 12/6  Procedures:   None   Antimicrobials:   Zosyn 12/4>12/5  Vancomycin 12/4>12/5  Cefepime 12/5>12/8  Ceftin 12/7> will continue twice a day for 10 days    Signed:  Manson Passey, MD  [facility] 11/25/2016, 10:43 AM  Pager #: [phone]  Time spent in minutes: less than 30 minutes   Discharge Exam: Vitals:   11/25/16 0501 11/25/16 0800  BP: 114/70 119/77  Pulse: 67 (!) 56  Resp: 18 17  Temp: 97.6 F (36.4 C) 98.6 F (37 C)   Vitals:   11/24/16 1448 11/24/16 2057 11/25/16 0501 11/25/16 0800  BP: 119/62 128/68 114/70 119/77  Pulse: 67 66 67 (!) 56  Resp: 18 18 18 17   Temp: 98 F (36.7 C) 97.4 F (36.3 C) 97.6 F (36.4 C) 98.6 F (37 C)  TempSrc: Oral Oral Oral Oral  SpO2: 100% 98% 99% 99%  Weight:      Height:        General: Pt is alert, follows commands appropriately, not in acute distress Cardiovascular: Regular rate and rhythm, S1/S2 + Respiratory: Clear to auscultation bilaterally, no wheezing, no crackles, no rhonchi Abdominal: Soft, non tender, non distended, bowel sounds +, no guarding Extremities: dressing in place, pulses palpable bilaterally DP and PT Neuro: Grossly nonfocal  Discharge Instructions  Discharge Instructions    AMB Referral to [facility] Care Management    Complete by:  As directed    Reason for consult:  Post hospital monitoring   Diagnoses of:  Diabetes   Expected date of contact:  1-3 days (reserved for hospital discharges)   Restart of services:  High risk  - Hgb A1c 10.9  Please assign to community nurse for transition of care calls and assess for home visits. Questions please call:   [facility], RN BSN CCM [facility]  [phone] business mobile phone Toll free  office [phone]   Call MD for:  persistant nausea and vomiting    Complete by:  As directed    Call MD for:  redness, tenderness, or signs of infection (pain, swelling, redness, odor or green/yellow discharge around incision site)    Complete by:  As directed    Call MD for:  severe uncontrolled pain    Complete by:  As directed    Diet - low sodium heart healthy    Complete by:  As directed    Discharge instructions    Complete by:  As directed    Continue Ceftin as prescribed for streptococcus bacteremia. Repeat blood cultures showed no growth. Continue insulin regimen per home dose as per prior to this admission. Would recommend holding metformin until infection is clear.   Increase activity slowly    Complete by:  As directed        Medication List    STOP taking these medications   doxycycline 100 MG tablet Commonly known as:  VIBRA-TABS   metFORMIN 500 MG 24 hr tablet Commonly known as:  GLUCOPHAGE-XR     TAKE these medications   acetaminophen 325 MG  tablet Commonly known as:  TYLENOL Take 2 tablets (650 mg total) by mouth every 6 (six) hours as needed for mild pain (or Fever >/= 101).   AMBULATORY NON FORMULARY MEDICATION Take 90 mg by mouth 2 (two) times daily. Medication Name: BRILINTA 90 mg BID (TWILIGHT Research Study PROVIDED)   AMBULATORY NON FORMULARY MEDICATION Take 81 mg by mouth daily. Medication Name: Aspirin 81 mg daily or PLACEBO (TWILIGHT Research study PROVIDED)   cefUROXime 500 MG tablet Commonly known as:  CEFTIN Take 1 tablet (500 mg total) by mouth 2 (two) times daily with a meal.   HYDROcodone-acetaminophen 5-325 MG tablet Commonly known as:  NORCO/VICODIN Take 1-2 tablets by mouth every 8 (eight) hours as needed for moderate pain.   Insulin Glargine 300 UNIT/ML Sopn Commonly known as:  TOUJEO SOLOSTAR Inject 48 Units into the skin daily. Reported on 04/27/2016   irbesartan 75 MG tablet Commonly known as:  AVAPRO Take 1 tablet (75 mg  total) by mouth daily.   isosorbide mononitrate 30 MG 24 hr tablet Commonly known as:  IMDUR Take 1 tablet (30 mg total) by mouth 2 (two) times daily.   lithium 600 MG capsule Take 600 mg by mouth daily.   metoprolol 50 MG tablet Commonly known as:  LOPRESSOR Take 1 tablet (50 mg total) by mouth 2 (two) times daily.   nitroGLYCERIN 0.4 MG SL tablet Commonly known as:  NITROSTAT DISSOLVE ONE TABLET UNDER THE TONGUE EVERY 5 MINUTES AS NEEDED FOR CHEST PAIN.  DO NOT EXCEED A TOTAL OF 3 DOSES IN 15 MINUTES   predniSONE 10 MG tablet Commonly known as:  DELTASONE Take 5 mg by mouth 2 (two) times daily.   ranolazine 500 MG 12 hr tablet Commonly known as:  RANEXA Take 1 tablet (500 mg total) by mouth 2 (two) times daily. What changed:  when to take this  additional instructions   rosuvastatin 40 MG tablet Commonly known as:  CRESTOR Take 1 tablet (40 mg total) by mouth daily.   sertraline 100 MG tablet Commonly known as:  ZOLOFT Take 100 mg by mouth daily.   traZODone 100 MG tablet Commonly known as:  DESYREL Take 100 mg by mouth at bedtime.      Follow-up Information    GRAVES,JOHN L, MD. Schedule an appointment as soon as possible for a visit in 2 week(s).   Specialty:  Orthopedic Surgery Contact information: [address] [place] [address] [phone]        [facility], MD. Schedule an appointment as soon as possible for a visit in 1 week(s).   Specialty:  Internal Medicine Contact information: [address] [place] [address] [phone]            The results of significant diagnostics from this hospitalization (including imaging, microbiology, ancillary and laboratory) are listed below for reference.    Significant Diagnostic Studies: Dg Chest 2 View  Result Date: 11/20/2016 CLINICAL DATA:  50 year old male with a history of diabetic infection EXAM: CHEST  2 VIEW COMPARISON:  06/15/2016 FINDINGS: Cardiomediastinal  silhouette is unchanged. Surgical changes of median sternotomy and CABG. No central vascular congestion. Lung volumes are adequate with no confluent airspace disease. No pleural effusion or pneumothorax. Symmetric nodular densities at the base of the lungs, favored to represent nipple shadows. No displaced fracture. IMPRESSION: No radiographic evidence of acute cardiopulmonary disease. Surgical changes of prior median sternotomy and CABG. Signed, Yvone Neu. Loreta Ave, DO [facility] Electronically  Signed   By: Gilmer MorJaime  Wagner D.O.   On: 11/20/2016 11:47   Mr Foot Left Wo Contrast  Result Date: 11/21/2016 CLINICAL DATA:  Chronic wound on the left great toe in a diabetic patient. Question osteomyelitis. EXAM: MRI OF THE LEFT FOOT WITHOUT CONTRAST TECHNIQUE: Multiplanar, multisequence MR imaging of the left foot was performed. No intravenous contrast was administered. COMPARISON:  Plain films left foot 11/20/2016. FINDINGS: Bones/Joint/Cartilage There is marrow edema throughout the distal phalanx of the great toe consistent osteomyelitis. Destructive change of the tuft is identified. Mild subchondral edema is seen about the fourth tarsometatarsal joint consistent with degenerative change. Advanced first MTP osteoarthritis is also noted. Ligaments Intact. Muscles and Tendons Atrophy of the intrinsic musculature the foot is identified. No muscle or tendon tear. Soft tissues A fluid collection is seen surrounding the tuft of the distal phalanx of the great toe. The collection measures approximately 1.1 cm craniocaudal by 0.8 cm AP by 1.2 cm transverse and is consistent with abscess. A wound is seen in the plantar soft tissues at the level of the distal phalanx of the great toe. Subjacent to the wound, ill-defined T2 hyperintensity measuring 1.2 cm transverse by 0.7 cm craniocaudal by 1.3 cm long may be due to abscess. IMPRESSION: Findings consistent with  osteomyelitis throughout the distal phalanx of the great toe. Associated abscess formation surrounding the tip of the distal phalanx is identified as described above. Wound in the plantar soft tissues of the great toe with a possible underlying abscess is also identified. Negative for septic joint. Advanced first MTP osteoarthritis. Milder degree of fourth tarsometatarsal degenerative change is seen. Electronically Signed   By: Drusilla Kannerhomas  Dalessio M.D.   On: 11/21/2016 10:25   Dg Foot Complete Left  Result Date: 11/20/2016 CLINICAL DATA:  First toe infection. EXAM: LEFT FOOT - COMPLETE 3+ VIEW COMPARISON:  None. FINDINGS: Deformity of the second and third proximal metatarsals is noted consistent with old fractures. Osteoarthritis of the first metatarsophalangeal joint is noted with hallux valgus deformity. There is lytic destruction seen involving the distal tuft of the first distal phalanx concerning for osteomyelitis. Postsurgical changes are noted in distal tibia. IMPRESSION: Lytic destruction of distal tuft of first distal phalanx consistent with osteomyelitis. Degenerative and posttraumatic changes are noted as well. Electronically Signed   By: Lupita RaiderJames  Green Jr, M.D.   On: 11/20/2016 11:38    Microbiology: Recent Results (from the past 240 hour(s))  Culture, blood (Routine X 2) w Reflex to ID Panel     Status: Abnormal   Collection Time: 11/20/16  1:30 PM  Result Value Ref Range Status   Specimen Description BLOOD LEFT ANTECUBITAL  Final   Special Requests   Final    BOTTLES DRAWN AEROBIC AND ANAEROBIC  10CC AER AND 5CC ANA   Culture  Setup Time   Final    GRAM POSITIVE COCCI IN CHAINS IN BOTH AEROBIC AND ANAEROBIC BOTTLES Organism ID to follow    Culture GROUP B STREP(S.AGALACTIAE)ISOLATED (A)  Final   Report Status 11/23/2016 FINAL  Final   Organism ID, Bacteria GROUP B STREP(S.AGALACTIAE)ISOLATED  Final      Susceptibility   Group b strep(s.agalactiae)isolated - MIC*    CLINDAMYCIN  <=0.25 SENSITIVE Sensitive     AMPICILLIN <=0.25 SENSITIVE Sensitive     ERYTHROMYCIN <=0.12 SENSITIVE Sensitive     VANCOMYCIN 0.5 SENSITIVE Sensitive     CEFTRIAXONE <=0.12 SENSITIVE Sensitive     LEVOFLOXACIN 1 SENSITIVE Sensitive     *  GROUP B STREP(S.AGALACTIAE)ISOLATED  Blood Culture ID Panel (Reflexed)     Status: Abnormal   Collection Time: 11/20/16  1:30 PM  Result Value Ref Range Status   Enterococcus species NOT DETECTED NOT DETECTED Final   Listeria monocytogenes NOT DETECTED NOT DETECTED Final   Staphylococcus species NOT DETECTED NOT DETECTED Final   Staphylococcus aureus NOT DETECTED NOT DETECTED Final   Streptococcus species DETECTED (A) NOT DETECTED Final    Comment: CRITICAL RESULT CALLED TO, READ BACK BY AND VERIFIED WITH: CARON AMEND,PHARMD @0454  11/21/16 MKELLY,MLT    Streptococcus agalactiae DETECTED (A) NOT DETECTED Final    Comment: CRITICAL RESULT CALLED TO, READ BACK BY AND VERIFIED WITH: CARON AMEND,PHARMD @0454  11/21/16 MKELLY,MLT    Streptococcus pneumoniae NOT DETECTED NOT DETECTED Final   Streptococcus pyogenes NOT DETECTED NOT DETECTED Final   Acinetobacter baumannii NOT DETECTED NOT DETECTED Final   Enterobacteriaceae species NOT DETECTED NOT DETECTED Final   Enterobacter cloacae complex NOT DETECTED NOT DETECTED Final   Escherichia coli NOT DETECTED NOT DETECTED Final   Klebsiella oxytoca NOT DETECTED NOT DETECTED Final   Klebsiella pneumoniae NOT DETECTED NOT DETECTED Final   Proteus species NOT DETECTED NOT DETECTED Final   Serratia marcescens NOT DETECTED NOT DETECTED Final   Haemophilus influenzae NOT DETECTED NOT DETECTED Final   Neisseria meningitidis NOT DETECTED NOT DETECTED Final   Pseudomonas aeruginosa NOT DETECTED NOT DETECTED Final   Candida albicans NOT DETECTED NOT DETECTED Final   Candida glabrata NOT DETECTED NOT DETECTED Final   Candida krusei NOT DETECTED NOT DETECTED Final   Candida parapsilosis NOT DETECTED NOT DETECTED  Final   Candida tropicalis NOT DETECTED NOT DETECTED Final  Culture, blood (Routine X 2) w Reflex to ID Panel     Status: Abnormal   Collection Time: 11/20/16  1:43 PM  Result Value Ref Range Status   Specimen Description BLOOD LEFT HAND  Final   Special Requests BOTTLES DRAWN AEROBIC ONLY  10CC  Final   Culture  Setup Time   Final    GRAM POSITIVE COCCI IN CHAINS AEROBIC BOTTLE ONLY CRITICAL VALUE NOTED.  VALUE IS CONSISTENT WITH PREVIOUSLY REPORTED AND CALLED VALUE.    Culture (A)  Final    GROUP B STREP(S.AGALACTIAE)ISOLATED SUSCEPTIBILITIES PERFORMED ON PREVIOUS CULTURE WITHIN THE LAST 5 DAYS.    Report Status 11/23/2016 FINAL  Final  Surgical pcr screen     Status: None   Collection Time: 11/22/16 12:30 AM  Result Value Ref Range Status   MRSA, PCR NEGATIVE NEGATIVE Final   Staphylococcus aureus NEGATIVE NEGATIVE Final    Comment:        The Xpert SA Assay (FDA approved for NASAL specimens in patients over 13 years of age), is one component of a comprehensive surveillance program.  Test performance has been validated by [facility] for patients greater than or equal to 81 year old. It is not intended to diagnose infection nor to guide or monitor treatment.   Culture, blood (routine x 2)     Status: None (Preliminary result)   Collection Time: 11/23/16  5:00 AM  Result Value Ref Range Status   Specimen Description BLOOD LEFT ANTECUBITAL  Final   Special Requests   Final    BOTTLES DRAWN AEROBIC AND ANAEROBIC BLUE 10CC RED 5CC   Culture NO GROWTH 1 DAY  Final   Report Status PENDING  Incomplete  Culture, blood (routine x 2)     Status: None (Preliminary result)   Collection Time: 11/23/16  5:06 AM  Result Value Ref Range Status   Specimen Description BLOOD LEFT HAND  Final   Special Requests BOTTLES DRAWN AEROBIC AND ANAEROBIC 10CC EA  Final   Culture NO GROWTH 1 DAY  Final   Report Status PENDING  Incomplete     Labs: Basic Metabolic Panel:  Recent Labs Lab  11/20/16 1026 11/21/16 0712 11/22/16 0415 11/23/16 0500  NA 135 133* 134* 136  K 5.7* 4.2 5.1 4.5  CL 101 100* 101 102  CO2 25 25 24 26   GLUCOSE 273* 251* 266* 279*  BUN 13 11 12 9   CREATININE 1.06 1.04 1.03 0.90  CALCIUM 9.6 8.8* 9.0 9.0   Liver Function Tests:  Recent Labs Lab 11/20/16 1026 11/21/16 0712  AST 19 15  ALT 12* 12*  ALKPHOS 75 63  BILITOT 1.0 0.6  PROT 7.3 6.1*  ALBUMIN 3.2* 2.6*   No results for input(s): LIPASE, AMYLASE in the last 168 hours. No results for input(s): AMMONIA in the last 168 hours. CBC:  Recent Labs Lab 11/20/16 1026 11/21/16 0712 11/23/16 0500 11/24/16 0432 11/25/16 0331  WBC 10.2 9.1 8.3 7.4 8.1  HGB 14.2 12.3* 12.7* 9.9* 12.3*  HCT 42.5 36.8* 39.1 29.7* 37.4*  MCV 87.4 86.0 85.9 85.8 85.6  PLT 211 178 207 157 231   Cardiac Enzymes: No results for input(s): CKTOTAL, CKMB, CKMBINDEX, TROPONINI in the last 168 hours. BNP: BNP (last 3 results)  Recent Labs  01/12/16 2141 11/20/16 1026  BNP 85.2 104.9*    ProBNP (last 3 results) No results for input(s): PROBNP in the last 8760 hours.  CBG:  Recent Labs Lab 11/24/16 0733 11/24/16 1156 11/24/16 1641 11/24/16 2104 11/25/16 0730  GLUCAP 233* 239* 382* 288* 225*

## 2016-11-25 NOTE — Discharge Instructions (Addendum)
Antibiotic Medicine °Antibiotic medicines are used to treat infections caused by bacteria. They work by injuring or killing the bacteria that is making you sick. °HOW IS AN ANTIBIOTIC CHOSEN? °An antibiotic is chosen based on many factors. To help your health care provider choose one for you, tell your health care provider if: °· You have any allergies. °· You are pregnant or plan to get pregnant. °· You are breastfeeding. °· You are taking any medicines. These include over-the-counter medicines, prescription medicines, and herbal remedies. °· You have a medical condition or problem you have not already discussed. °Your health care provider will also consider: °· How often the medicine has to be taken. °· Common side effects of the medicine. °· The cost of the medicine. °· The taste of the medicine. °If you have questions about why an antibiotic was chosen, make sure to ask. °FOR HOW LONG SHOULD I TAKE MY ANTIBIOTIC? °Continue to take your antibiotic for as long as told by your health care provider. Do not stop taking it when you feel better. If you stop taking it too soon: °· You may start to feel sick again. °· Your infection may become harder to treat. °· Complications may develop. °WHAT IF I MISS A DOSE? °Try not to miss any doses of medicine. If you miss a dose, take it as soon as possible. However, if it is almost time for the next dose: °· If you are taking 2 doses per day, take the missed dose and the next dose 5 to 6 hours apart. °· If you are taking 3 or more doses per day, take the missed dose and the next dose 2 to 4 hours apart, then go back to the normal schedule. °If you cannot make up a missed dose, take the next scheduled dose on time. Then take the missed dose after you have taken all the doses as recommended by your health care provider, as if you had one more dose left. °DO ANTIBIOTICS AFFECT BIRTH CONTROL? °Birth control pills may not work while you are on antibiotics. If you are taking birth  control pills, continue taking them as usual and use a second form of birth control, such as a condom, to avoid unwanted pregnancy. Continue using the second form of birth control until you are finished with your current 1 month cycle of birth control pills. °OTHER INFORMATION °· If there is any medicine left over, throw it away. °· Never take someone else's antibiotics. °· Never take leftover antibiotics. °SEEK MEDICAL CARE IF: °· You get worse. °· You do not feel better within a few days of starting the antibiotic medicine. °· You vomit. °· White patches appear in your mouth. °· You have new joint pain that begins after starting the antibiotic. °· You have new muscle aches that begin after starting the antibiotic. °· You had a fever before starting the antibiotic and it returns. °· You have any symptoms of an allergic reaction, such as an itchy rash. If this happens, stop taking the antibiotic. °SEEK IMMEDIATE MEDICAL CARE IF: °· Your urine turns dark or becomes blood-colored. °· Your skin turns yellow. °· You bruise or bleed easily. °· You have severe diarrhea and abdominal cramps. °· You have a severe headache. °· You have signs of a severe allergic reaction, such as: °¨ Trouble breathing. °¨ Wheezing. °¨ Swelling of the lips, tongue, or face. °¨ Fainting. °¨ Blisters on the skin or in the mouth. °If you have signs of a severe allergic   reaction, stop taking the antibiotic right away. This information is not intended to replace advice given to you by your health care provider. Make sure you discuss any questions you have with your health care provider. Document Released: 08/16/2004 Document Revised: 08/25/2015 Document Reviewed: 04/21/2015 Elsevier Interactive Patient Education  2017 Elsevier Inc. Acetaminophen; Hydrocodone tablets or capsules What is this medicine? ACETAMINOPHEN; HYDROCODONE (a set a MEE noe fen; hye droe KOE done) is a pain reliever. It is used to treat moderate to severe pain. This  medicine may be used for other purposes; ask your health care provider or pharmacist if you have questions. COMMON BRAND NAME(S): Anexsia, Bancap HC, Ceta-Plus, Co-Gesic, Comfortpak, Dolagesic, [employer], [address], [address], Hydrogesic, [place], Lorcet HD, Lorcet Plus, Lortab, Margesic H, Maxidone, [place], Polygesic, Blanche, [place], Retail buyer, Vicodin, Vicodin ES, Vicodin HP, [place] Baseman What should I tell my health care provider before I take this medicine? They need to know if you have any of these conditions: -brain tumor -Crohn's disease, inflammatory bowel disease, or ulcerative colitis -drug abuse or addiction -head injury -heart or circulation problems -if you often drink alcohol -kidney disease or problems going to the bathroom -liver disease -lung disease, asthma, or breathing problems -an unusual or allergic reaction to acetaminophen, hydrocodone, other opioid analgesics, other medicines, foods, dyes, or preservatives -pregnant or trying to get pregnant -breast-feeding How should I use this medicine? Take this medicine by mouth with a glass of water. Follow the directions on the prescription label. You can take it with or without food. If it upsets your stomach, take it with food. Do not take your medicine more often than directed. A special MedGuide will be given to you by the pharmacist with each prescription and refill. Be sure to read this information carefully each time. Talk to your pediatrician regarding the use of this medicine in children. Special care may be needed. Overdosage: If you think you have taken too much of this medicine contact a poison control center or emergency room at once. NOTE: This medicine is only for you. Do not share this medicine with others. What if I miss a dose? If you miss a dose, take it as soon as you can. If it is almost time for your next dose, take only that dose. Do not take double or extra doses. What may interact with this  medicine? This medicine may interact with the following medications: -alcohol -antiviral medicines for HIV or AIDS -atropine -antihistamines for allergy, cough and cold -certain antibiotics like erythromycin, clarithromycin -certain medicines for anxiety or sleep -certain medicines for bladder problems like oxybutynin, tolterodine -certain medicines for depression like amitriptyline, fluoxetine, sertraline -certain medicines for fungal infections like ketoconazole and itraconazole -certain medicines for Parkinson's disease like benztropine, trihexyphenidyl -certain medicines for seizures like carbamazepine, phenobarbital, phenytoin, primidone -certain medicines for stomach problems like dicyclomine, hyoscyamine -certain medicines for travel sickness like scopolamine -general anesthetics like halothane, isoflurane, methoxyflurane, propofol -ipratropium -local anesthetics like lidocaine, pramoxine, tetracaine -MAOIs like Carbex, Eldepryl, Marplan, Nardil, and Parnate -medicines that relax muscles for surgery -other medicines with acetaminophen -other narcotic medicines for pain or cough -phenothiazines like chlorpromazine, mesoridazine, prochlorperazine, thioridazine -rifampin This list may not describe all possible interactions. Give your health care provider a list of all the medicines, herbs, non-prescription drugs, or dietary supplements you use. Also tell them if you smoke, drink alcohol, or use illegal drugs. Some items may interact with your medicine. What should I watch for while using this medicine? Tell your doctor or health  care professional if your pain does not go away, if it gets worse, or if you have new or a different type of pain. You may develop tolerance to the medicine. Tolerance means that you will need a higher dose of the medicine for pain relief. Tolerance is normal and is expected if you take the medicine for a long time. Do not suddenly stop taking your medicine  because you may develop a severe reaction. Your body becomes used to the medicine. This does NOT mean you are addicted. Addiction is a behavior related to getting and using a drug for a non-medical reason. If you have pain, you have a medical reason to take pain medicine. Your doctor will tell you how much medicine to take. If your doctor wants you to stop the medicine, the dose will be slowly lowered over time to avoid any side effects. There are different types of narcotic medicines (opiates). If you take more than one type at the same time or if you are taking another medicine that also causes drowsiness, you may have more side effects. Give your health care provider a list of all medicines you use. Your doctor will tell you how much medicine to take. Do not take more medicine than directed. Call emergency for help if you have problems breathing or unusual sleepiness. Do not take other medicines that contain acetaminophen with this medicine. Always read labels carefully. If you have questions, ask your doctor or pharmacist. If you take too much acetaminophen get medical help right away. Too much acetaminophen can be very dangerous and cause liver damage. Even if you do not have symptoms, it is important to get help right away. You may get drowsy or dizzy. Do not drive, use machinery, or do anything that needs mental alertness until you know how this medicine affects you. Do not stand or sit up quickly, especially if you are an older patient. This reduces the risk of dizzy or fainting spells. Alcohol may interfere with the effect of this medicine. Avoid alcoholic drinks. The medicine will cause constipation. Try to have a bowel movement at least every 2 to 3 days. If you do not have a bowel movement for 3 days, call your doctor or health care professional. Your mouth may get dry. Chewing sugarless gum or sucking hard candy, and drinking plenty of water may help. Contact your doctor if the problem does not go  away or is severe. What side effects may I notice from receiving this medicine? Side effects that you should report to your doctor or health care professional as soon as possible: -allergic reactions like skin rash, itching or hives, swelling of the face, lips, or tongue -breathing problems -confusion -redness, blistering, peeling or loosening of the skin, including inside the mouth -signs and symptoms of low blood pressure like dizziness; feeling faint or lightheaded, falls; unusually weak or tired -trouble passing urine or change in the amount of urine -yellowing of the eyes or skin Side effects that usually do not require medical attention (report to your doctor or health care professional if they continue or are bothersome): -constipation -dry mouth -nausea, vomiting -tiredness This list may not describe all possible side effects. Call your doctor for medical advice about side effects. You may report side effects to FDA at 1-800-FDA-1088. Where should I keep my medicine? Keep out of the reach of children. This medicine can be abused. Keep your medicine in a safe place to protect it from theft. Do not share this medicine  with anyone. Selling or giving away this medicine is dangerous and against the law. This medicine may cause accidental overdose and death if it taken by other adults, children, or pets. Mix any unused medicine with a substance like cat litter or coffee grounds. Then throw the medicine away in a sealed container like a sealed bag or a coffee can with a lid. Do not use the medicine after the expiration date. Store at room temperature between 15 and 30 degrees C (59 and 86 degrees F). NOTE: This sheet is a summary. It may not cover all possible information. If you have questions about this medicine, talk to your doctor, pharmacist, or health care provider.  2017 Elsevier/Gold Standard (2015-08-27 10:02:16) Living With an Amputation The most common causes of amputation from  the hip down include:  Diseases that:  Reduce the blood flow to an area of your body.  Decrease your body's ability to fight infection.  Traumatic injuries that cause significant damage to body tissues.  Birth defects.  Cancerous lumps (malignant tumors). Amputation above the hip is usually the result of trauma or birth defect. Disease is a less common cause. Living with an amputation can be challenging, but you can still live a long, productive life. WHAT ARE THE COMMON CHALLENGES OF LIVING WITH AN AMPUTATION? The most common challenges are mobility and self-care. With some new habits, though, it is often possible to do all of the activities you used to do. You may be able to use a device that substitutes for your limb (prosthesis). The prosthesis helps you adapt more quickly to these challenges. Your health care provider can help select a prosthesis to meet your needs. A person who helps you choose and fits you with a prosthesis (prosthetist) may also help. A rehabilitation program can also help you gain mobility and self-reliance. Your rehabilitation team may include:  Physicians.  Physical and occupational therapists.  Prosthetists.  Nurses.  Social workers.  Psychologists.  Dietitians. Your rehabilitation team will help you with all aspects of recovery and returning to work, home, sports, and your community. You will learn to:  Get around safely.  Adjust your home.  Exercise.  Use a prosthetic.  Work through Surveyor, quantity.  Connect with other people who have gone through the same experience. Additional challenges may include:  Grieving period.  Body image issues.  Lifestyle issues, such as sex.  Maintaining a healthy weight. These issues are normal. Discuss these with your rehabilitation team. WHEN CAN I RETURN TO MY REGULAR ACTIVITIES?  Returning to your normal activities is part of healing. Changes can often be made to equipment that allow you to  return to a sport or hobby. Some Arboriculturist for this. Discuss all of your leisure interests with your health care provider and prosthetist. WHEN CAN I RETURN TO WORK? When you are ready to return to work, your therapists can perform job site evaluations and make recommendations to help you perform your job. You may not be able to return to your same job. Your local Office of Vocational Rehabilitation can assist you in job retraining. FOR MORE INFORMATION: Visit these online resources. You can find tips on everything from getting dressed and using bathrooms to driving and travel considerations. These resources can also connect you to a network of emotional support, activities, and innovations. You may also search the Internet to find a local support group.  Amputee Coalition: http://www.amputee-coalition.org/ensuring-fall-safety/  Amputee Support Groups: http://amputee.supportgroups.com  Daily Strength: http://melendez.com/  [employer]  Amputee Foundation: http://www.nationalamputation.[facility] on Health, Physical Activity and Disability: http://www.nchpad.org  Disabled Sports [place]: http://www.disabledsportsusa.org  American Academy of Orthotists and Prosthetists: http://www.oandp.org This information is not intended to replace advice given to you by your health care provider. Make sure you discuss any questions you have with your health care provider. Document Released: 08/26/2002 Document Revised: 12/25/2014 Document Reviewed: 04/21/2014 Elsevier Interactive Patient Education  2017 Elsevier Inc. Bacteremia Introduction Bacteremia is the presence of bacteria in the blood. A small amount of bacteria may not cause any symptoms. Sometimes, the bacteria spread and cause infection in other parts of the body, such as the heart, joints, bones, or brain. Having a great amount of bacteria can cause a serious, sometimes life-threatening  infection called sepsis. What are the causes? This condition is caused by bacteria that get into the blood. Bacteria can enter the blood:  During a dental or medical procedure.  After you brush your teeth so hard that the gums bleed.  Through a scrape or cut on your skin. More severe types of bacteremia can be caused by:  A bacterial infection, such as pneumonia, that spreads to the blood.  Using a dirty needle. What increases the risk? This condition is more likely to develop in:  Children and elderly adults.  People who have a long-lasting (chronic) disease or medical condition.  People who have an artificial joint or heart valve.  People who have heart valve disease.  People who have a tube, such as a catheter or IV tube, that has been inserted for a medical treatment.  People who have a weak body defense system (immune system).  People who use IV drugs. What are the signs or symptoms? Usually, this condition does not cause symptoms when it is mild. When it is more serious, it may cause:  Fever.  Chills.  Racing heart.  Shortness of breath.  Dizziness.  Weakness.  Confusion.  Nausea or vomiting.  Diarrhea. Bacteremia that has spread to other parts of the body may cause symptoms in those areas. How is this diagnosed? This condition may be diagnosed with a physical exam and tests, such as:  A complete blood count (CBC). This test looks for signs of infection.  Blood cultures. These look for bacteria in your blood.  Tests of any IV tubes. These look for a source of infection.  Urine tests.  Imaging tests, such as an X-ray, CT scan, MRI, or heart ultrasound. How is this treated? If the condition is mild, treatment is usually not needed. Usually, the bodys immune system will remove the bacteria. If the condition is more serious, it may be treated with:  Antibiotic medicines through an IV tube. These may be given for about 2 weeks. At first, the  antibiotic that is given may kill most types of blood bacteria. If your test results show that a certain kind of bacteria is causing problems, the antibiotic may be changed to kill only the bacteria that are causing problems.  Antibiotics taken by mouth.  Removing any catheter or IV tube that is a source of infection.  Blood pressure and breathing support, if needed.  Surgery to control the source or spread of infection, if needed. Follow these instructions at home:  Take over-the-counter and prescription medicines only as told by your health care provider.  If you were prescribed an antibiotic, take it as told by your health care provider. Do not stop taking the antibiotic even if you start to feel better.  Rest  at home until your condition is under control.  Drink enough fluid to keep your urine clear or pale yellow.  Keep all follow-up visits as told by your health care provider. This is important. How is this prevented? Take these actions to help prevent future episodes of bacteremia:  Get all vaccinations as recommended by your health care provider.  Clean and cover scrapes or cuts.  Bathe regularly.  Wash your hands often.  Before any dental or surgical procedure, ask your health care provider if you should take an antibiotic. Contact a health care provider if:  Your symptoms get worse.  You continue to have symptoms after treatment.  You develop new symptoms after treatment. Get help right away if:  You have chest pain or trouble breathing.  You develop confusion, dizziness, or weakness.  You develop pale skin. This information is not intended to replace advice given to you by your health care provider. Make sure you discuss any questions you have with your health care provider. Document Released: 09/17/2006 Document Revised: 06/23/2016 Document Reviewed: 02/06/2015  2017 Elsevier

## 2016-11-28 DIAGNOSIS — F3132 Bipolar disorder, current episode depressed, moderate: Secondary | ICD-10-CM | POA: Diagnosis not present

## 2016-11-28 LAB — CULTURE, BLOOD (ROUTINE X 2)
Culture: NO GROWTH
Culture: NO GROWTH

## 2016-12-01 ENCOUNTER — Other Ambulatory Visit: Payer: Self-pay | Admitting: *Deleted

## 2016-12-01 NOTE — Patient Outreach (Signed)
Transition of care call. See next note dated 12/07/16.

## 2016-12-07 ENCOUNTER — Encounter: Payer: Self-pay | Admitting: *Deleted

## 2016-12-07 NOTE — Patient Outreach (Addendum)
Pt returned my call (Initial call made 12/01/16). He reports he is doing much better than when I last saw him in August. He says he is no longer having chest pain and SOB like he was. He says, "Yes, I have lost a toe because I wasn't doing what I should have done, like you advised me, but I am doing much better." He says he is taking his diabetes management much more serious and he seems to be making some progress. He says, that I really did help him before but he wasn't in the mind set to do what he was supposed to do. He says he is in agreement for me to call him weekly and to visit only if he needs me too.  Pt outlook is much improved. He is feeling less depressed and more stable mentally.  Current Outpatient Prescriptions on File Prior to Visit  Medication Sig Dispense Refill  . acetaminophen (TYLENOL) 325 MG tablet Take 2 tablets (650 mg total) by mouth every 6 (six) hours as needed for mild pain (or Fever >/= 101). 30 tablet 0  . AMBULATORY NON FORMULARY MEDICATION Take 90 mg by mouth 2 (two) times daily. Medication Name: BRILINTA 90 mg BID (TWILIGHT Research Study PROVIDED)    . AMBULATORY NON FORMULARY MEDICATION Take 81 mg by mouth daily. Medication Name: Aspirin 81 mg daily or PLACEBO (TWILIGHT Research study PROVIDED)    . HYDROcodone-acetaminophen (NORCO/VICODIN) 5-325 MG tablet Take 1-2 tablets by mouth every 8 (eight) hours as needed for moderate pain. 20 tablet 0  . Insulin Glargine (TOUJEO SOLOSTAR) 300 UNIT/ML SOPN Inject 48 Units into the skin daily. Reported on 04/27/2016 5 pen 0  . irbesartan (AVAPRO) 75 MG tablet Take 1 tablet (75 mg total) by mouth daily. 90 tablet 3  . isosorbide mononitrate (IMDUR) 30 MG 24 hr tablet Take 1 tablet (30 mg total) by mouth 2 (two) times daily. 180 tablet 3  . lithium 600 MG capsule Take 600 mg by mouth daily.    . metoprolol tartrate (LOPRESSOR) 50 MG tablet Take 1 tablet (50 mg total) by mouth 2 (two) times daily. 180 tablet 3  . nitroGLYCERIN  (NITROSTAT) 0.4 MG SL tablet DISSOLVE ONE TABLET UNDER THE TONGUE EVERY 5 MINUTES AS NEEDED FOR CHEST PAIN.  DO NOT EXCEED A TOTAL OF 3 DOSES IN 15 MINUTES 25 tablet 3  . predniSONE (DELTASONE) 10 MG tablet Take 5 mg by mouth 2 (two) times daily.    . ranolazine (RANEXA) 500 MG 12 hr tablet Take 1 tablet (500 mg total) by mouth 2 (two) times daily. (Patient taking differently: Take 500 mg by mouth daily. Samples from MD office; supposed to be twice daily) 56 tablet 0  . rosuvastatin (CRESTOR) 40 MG tablet Take 1 tablet (40 mg total) by mouth daily. 90 tablet 3  . sertraline (ZOLOFT) 100 MG tablet Take 100 mg by mouth daily.    . traZODone (DESYREL) 100 MG tablet Take 100 mg by mouth at bedtime.      No current facility-administered medications on file prior to visit.     Depression screen [facility] 2/9 12/07/2016 02/03/2016  Decreased Interest 1 1  Down, Depressed, Hopeless 1 0  PHQ - 2 Score 2 1  Altered sleeping 0 -  Tired, decreased energy 1 -  Change in appetite 0 -  Feeling bad or failure about yourself  1 -  Trouble concentrating 0 -  Moving slowly or fidgety/restless 0 -  Suicidal thoughts 0 -  PHQ-9 Score 4 -  Difficult doing work/chores Somewhat difficult -   Fall Risk  12/07/2016 02/03/2016 02/03/2016  Falls in the past year? No - No  Risk for fall due to : Impaired balance/gait Medication side effect Medication side effect    [facility] CM Care Plan Problem One   Flowsheet Row Most Recent Value  Care Plan Problem One  Needs encouragement and suggestions on how to manage his diabetes the best way he can.  Role Documenting the Problem One  Care Management Coordinator  Care Plan for Problem One  Active  [facility] Long Term Goal (31-90 days)  Pt will call NP for any problems over the next 31 days.  [facility] Long Term Goal Start Date  12/07/16  Interventions for Problem One Long Term Goal  I will send an Emmi about Holiday eating.  [facility] CM Short Term Goal #1 (0-30 days)  Pt will monitor his glucose  level daily and record and report each week.  [facility] CM Short Term Goal #1 Start Date  12/01/16  [facility] CM Short Term Goal #2 (0-30 days)  Pt glucose levels will be within normal limits 50% of the time over the next 30 days.  [facility]N CM Short Term Goal #2 Start Date  12/07/16  [facility]N CM Short Term Goal #3 (0-30 days)  Pt will made plans to have an annual eye exam, every 6 month podiatry visit and routinely primary care visits for diabetes mangaement.  [facility] CM Short Term Goal #3 Start Date  12/07/16     I will be calling pt weekly for transition of care calls.   Almetta Lovely [facility] Care Manager [phone]

## 2016-12-13 ENCOUNTER — Encounter: Payer: Self-pay | Admitting: Cardiovascular Disease

## 2016-12-19 ENCOUNTER — Other Ambulatory Visit: Payer: Self-pay | Admitting: *Deleted

## 2016-12-19 NOTE — Patient Outreach (Signed)
Transtition of care call. Mr. Austin Arnold is doing fair today, he is just waking up and he says he is tired. He reports he may have deleted the [facility] programs I sent as he didn't recognize the name. He asks that I send these again and I will. He reports his foot is healing well. He still needs to do wound care daily. He is being vigilent of his diet. He checks his glucose several times a week.  I have encouraged him to review the [facility] programs and to check his glucose in the am several times a week and in the pm several times a week so he can be aware if his glucose levels are normal (<120 in am and <160 in pm).  I will call him again next week.  Almetta Lovely [facility] Care Manager [phone]

## 2016-12-27 ENCOUNTER — Other Ambulatory Visit: Payer: Self-pay | Admitting: *Deleted

## 2016-12-27 DIAGNOSIS — F3132 Bipolar disorder, current episode depressed, moderate: Secondary | ICD-10-CM | POA: Diagnosis not present

## 2016-12-27 NOTE — Patient Outreach (Signed)
Transition of care call: diabetes.  Pt reports he has not been checking his glucose as much as I had requested but his levels are usually in the 200's. He is paying more attention to what he is eating and is trying to eat smaller portions and avoid all concentrated sweets.  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 I have encourage him to endeavor to follow his diabetic self management to avoid further complications.  Almetta Lovely [facility] Care Manager [phone]

## 2016-12-28 DIAGNOSIS — F3132 Bipolar disorder, current episode depressed, moderate: Secondary | ICD-10-CM | POA: Diagnosis not present

## 2017-01-02 DIAGNOSIS — M25562 Pain in left knee: Secondary | ICD-10-CM | POA: Diagnosis not present

## 2017-01-02 DIAGNOSIS — M25552 Pain in left hip: Secondary | ICD-10-CM | POA: Diagnosis not present

## 2017-01-03 ENCOUNTER — Other Ambulatory Visit: Payer: Self-pay | Admitting: *Deleted
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
## 2017-01-10 ENCOUNTER — Other Ambulatory Visit: Payer: Self-pay | Admitting: *Deleted

## 2017-01-10 DIAGNOSIS — F3132 Bipolar disorder, current episode depressed, moderate: Secondary | ICD-10-CM | POA: Diagnosis not present

## 2017-01-10 NOTE — Patient Outreach (Signed)
Case Closure phone call. I was not able to talk with Mr. Austin Arnold. I left a message and asked that he return my call.  Austin Arnold [facility] Care Manager [phone]

## 2017-01-25 ENCOUNTER — Other Ambulatory Visit: Payer: Self-pay | Admitting: *Deleted

## 2017-01-25 DIAGNOSIS — F3132 Bipolar disorder, current episode depressed, moderate: Secondary | ICD-10-CM | POA: Diagnosis not present

## 2017-01-25 NOTE — Patient Outreach (Signed)
Attempted a second case closure call but pt was not home. I left a message and requested that he return my call at his convenience.  Almetta Lovely [facility] Care Manager [phone]

## 2017-01-30 DIAGNOSIS — M25552 Pain in left hip: Secondary | ICD-10-CM | POA: Diagnosis not present

## 2017-01-30 DIAGNOSIS — M25561 Pain in right knee: Secondary | ICD-10-CM | POA: Diagnosis not present

## 2017-02-02 DIAGNOSIS — L03032 Cellulitis of left toe: Secondary | ICD-10-CM | POA: Diagnosis not present

## 2017-02-02 DIAGNOSIS — L089 Local infection of the skin and subcutaneous tissue, unspecified: Secondary | ICD-10-CM | POA: Diagnosis not present

## 2017-02-02 DIAGNOSIS — E1169 Type 2 diabetes mellitus with other specified complication: Secondary | ICD-10-CM | POA: Diagnosis not present

## 2017-02-05 ENCOUNTER — Other Ambulatory Visit: Payer: Self-pay | Admitting: *Deleted

## 2017-02-05 ENCOUNTER — Encounter: Payer: Self-pay | Admitting: *Deleted

## 2017-02-05 NOTE — Patient Outreach (Signed)
Case Closure Call.  Mr. Austin Arnold reports he is doing fair. He did go to the ED one time since the last time I spoke with him because he discovered he had injured his second toe and it was infected. He had previously lost his great toe. He was treated and it is resolved. He admits to me he is having great problems with his diet as he cannot seem to abstain from eating junk food like Lillte Debbies. I counseled him on this and asked him to commit to abstain and choose for example an apple. I have offered to refer him to the nutrition center for further counseling and he doesn't feel like he can commit to that at this time. He states he knows what he should and shouldn't eat but just can't seem to stop himself. I have advised him that I will call the Nutrtion Center and have them send him some information to encouraged him. He was agreeable to this.  I am closing his case today. I have reiterated to him that I am available to him if he needs to discuss any issue.  Almetta Lovely [facility] Care Manager [phone]

## 2017-02-06 ENCOUNTER — Telehealth: Payer: Self-pay | Admitting: Cardiovascular Disease

## 2017-02-06 DIAGNOSIS — L97519 Non-pressure chronic ulcer of other part of right foot with unspecified severity: Secondary | ICD-10-CM | POA: Diagnosis not present

## 2017-02-06 DIAGNOSIS — M2041 Other hammer toe(s) (acquired), right foot: Secondary | ICD-10-CM | POA: Diagnosis not present

## 2017-02-06 DIAGNOSIS — M2042 Other hammer toe(s) (acquired), left foot: Secondary | ICD-10-CM | POA: Diagnosis not present

## 2017-02-06 DIAGNOSIS — M21611 Bunion of right foot: Secondary | ICD-10-CM | POA: Diagnosis not present

## 2017-02-06 DIAGNOSIS — E0843 Diabetes mellitus due to underlying condition with diabetic autonomic (poly)neuropathy: Secondary | ICD-10-CM | POA: Diagnosis not present

## 2017-02-06 DIAGNOSIS — L97911 Non-pressure chronic ulcer of unspecified part of right lower leg limited to breakdown of skin: Secondary | ICD-10-CM | POA: Diagnosis not present

## 2017-02-06 DIAGNOSIS — L03032 Cellulitis of left toe: Secondary | ICD-10-CM | POA: Diagnosis not present

## 2017-02-06 NOTE — Telephone Encounter (Signed)
Called the patient and left a voicemail to call me back and schedule his appointment with Dr. Royann Shivers.

## 2017-02-15 DIAGNOSIS — F3132 Bipolar disorder, current episode depressed, moderate: Secondary | ICD-10-CM | POA: Diagnosis not present

## 2017-02-16 DIAGNOSIS — L97911 Non-pressure chronic ulcer of unspecified part of right lower leg limited to breakdown of skin: Secondary | ICD-10-CM | POA: Diagnosis not present

## 2017-02-16 DIAGNOSIS — M21611 Bunion of right foot: Secondary | ICD-10-CM | POA: Diagnosis not present

## 2017-02-16 DIAGNOSIS — E0843 Diabetes mellitus due to underlying condition with diabetic autonomic (poly)neuropathy: Secondary | ICD-10-CM | POA: Diagnosis not present

## 2017-02-16 DIAGNOSIS — L97519 Non-pressure chronic ulcer of other part of right foot with unspecified severity: Secondary | ICD-10-CM | POA: Diagnosis not present

## 2017-02-22 DIAGNOSIS — F3132 Bipolar disorder, current episode depressed, moderate: Secondary | ICD-10-CM | POA: Diagnosis not present

## 2017-03-02 DIAGNOSIS — E0843 Diabetes mellitus due to underlying condition with diabetic autonomic (poly)neuropathy: Secondary | ICD-10-CM | POA: Diagnosis not present

## 2017-03-02 DIAGNOSIS — L97911 Non-pressure chronic ulcer of unspecified part of right lower leg limited to breakdown of skin: Secondary | ICD-10-CM | POA: Diagnosis not present

## 2017-03-02 DIAGNOSIS — L97519 Non-pressure chronic ulcer of other part of right foot with unspecified severity: Secondary | ICD-10-CM | POA: Diagnosis not present

## 2017-03-02 DIAGNOSIS — M21611 Bunion of right foot: Secondary | ICD-10-CM | POA: Diagnosis not present

## 2017-03-09 ENCOUNTER — Encounter: Payer: Self-pay | Admitting: *Deleted

## 2017-03-09 DIAGNOSIS — Z006 Encounter for examination for normal comparison and control in clinical research program: Secondary | ICD-10-CM

## 2017-03-09 NOTE — Progress Notes (Signed)
TWILIGHT Research study month 9 follow up visit completed. Patient denies any bleeding or other adverse events. He has been 97 % compliant with ASA/Placebo and 95 % compliant with Brilinta. He states he ran out of the ASA/Placebo and took open label ASA on 03/06/17 -03/09/17. I dispensed the following bottles [id number] ; [id number]; [id number]. His next study required visit is due no later than 25/SEP/2018. I instructed patient that at his next visit he will no longer receive Brilinta or ASA/Placebo. Further antiplatelet therapy will be at the discretion of his cardiologist. Questions encouraged and answered.

## 2017-03-14 ENCOUNTER — Other Ambulatory Visit: Payer: Self-pay

## 2017-03-14 MED ORDER — ROSUVASTATIN CALCIUM 40 MG PO TABS: 40.0000 mg | ORAL_TABLET | Freq: Every day | ORAL | 0 refills | Status: DC

## 2017-03-20 DIAGNOSIS — F3132 Bipolar disorder, current episode depressed, moderate: Secondary | ICD-10-CM | POA: Diagnosis not present

## 2017-03-22 DIAGNOSIS — F3132 Bipolar disorder, current episode depressed, moderate: Secondary | ICD-10-CM | POA: Diagnosis not present

## 2017-03-23 DIAGNOSIS — M21611 Bunion of right foot: Secondary | ICD-10-CM | POA: Diagnosis not present

## 2017-03-23 DIAGNOSIS — L97519 Non-pressure chronic ulcer of other part of right foot with unspecified severity: Secondary | ICD-10-CM | POA: Diagnosis not present

## 2017-03-23 DIAGNOSIS — L97911 Non-pressure chronic ulcer of unspecified part of right lower leg limited to breakdown of skin: Secondary | ICD-10-CM | POA: Diagnosis not present

## 2017-03-23 DIAGNOSIS — E0843 Diabetes mellitus due to underlying condition with diabetic autonomic (poly)neuropathy: Secondary | ICD-10-CM | POA: Diagnosis not present

## 2017-04-03 DIAGNOSIS — R768 Other specified abnormal immunological findings in serum: Secondary | ICD-10-CM | POA: Diagnosis not present

## 2017-04-03 DIAGNOSIS — R5383 Other fatigue: Secondary | ICD-10-CM | POA: Diagnosis not present

## 2017-04-03 DIAGNOSIS — M069 Rheumatoid arthritis, unspecified: Secondary | ICD-10-CM | POA: Diagnosis not present

## 2017-04-03 DIAGNOSIS — M2550 Pain in unspecified joint: Secondary | ICD-10-CM | POA: Diagnosis not present

## 2017-04-03 DIAGNOSIS — Z8739 Personal history of other diseases of the musculoskeletal system and connective tissue: Secondary | ICD-10-CM | POA: Diagnosis not present

## 2017-04-03 DIAGNOSIS — M79642 Pain in left hand: Secondary | ICD-10-CM | POA: Diagnosis not present

## 2017-04-03 DIAGNOSIS — M79641 Pain in right hand: Secondary | ICD-10-CM | POA: Diagnosis not present

## 2017-04-05 DIAGNOSIS — E782 Mixed hyperlipidemia: Secondary | ICD-10-CM | POA: Diagnosis not present

## 2017-04-05 DIAGNOSIS — E1165 Type 2 diabetes mellitus with hyperglycemia: Secondary | ICD-10-CM | POA: Diagnosis not present

## 2017-04-05 DIAGNOSIS — Z125 Encounter for screening for malignant neoplasm of prostate: Secondary | ICD-10-CM | POA: Diagnosis not present

## 2017-04-10 DIAGNOSIS — F3132 Bipolar disorder, current episode depressed, moderate: Secondary | ICD-10-CM | POA: Diagnosis not present

## 2017-04-12 DIAGNOSIS — E782 Mixed hyperlipidemia: Secondary | ICD-10-CM | POA: Diagnosis not present

## 2017-04-12 DIAGNOSIS — M069 Rheumatoid arthritis, unspecified: Secondary | ICD-10-CM | POA: Diagnosis not present

## 2017-04-12 DIAGNOSIS — E1165 Type 2 diabetes mellitus with hyperglycemia: Secondary | ICD-10-CM | POA: Diagnosis not present

## 2017-04-12 DIAGNOSIS — I2510 Atherosclerotic heart disease of native coronary artery without angina pectoris: Secondary | ICD-10-CM | POA: Diagnosis not present

## 2017-04-23 ENCOUNTER — Encounter: Payer: Self-pay | Admitting: *Deleted

## 2017-04-24 ENCOUNTER — Ambulatory Visit: Payer: Medicare Other | Admitting: Diagnostic Neuroimaging

## 2017-04-24 ENCOUNTER — Encounter: Payer: Self-pay | Admitting: *Deleted

## 2017-04-24 DIAGNOSIS — F3132 Bipolar disorder, current episode depressed, moderate: Secondary | ICD-10-CM | POA: Diagnosis not present

## 2017-04-26 ENCOUNTER — Ambulatory Visit: Payer: Medicare Other | Admitting: Cardiovascular Disease

## 2017-05-07 ENCOUNTER — Ambulatory Visit (INDEPENDENT_AMBULATORY_CARE_PROVIDER_SITE_OTHER): Payer: Medicare Other | Admitting: Diagnostic Neuroimaging

## 2017-05-07 ENCOUNTER — Encounter: Payer: Self-pay | Admitting: Diagnostic Neuroimaging

## 2017-05-07 VITALS — BP 116/55 | HR 67 | Ht 74.0 in | Wt 231.6 lb

## 2017-05-07 DIAGNOSIS — R202 Paresthesia of skin: Secondary | ICD-10-CM | POA: Diagnosis not present

## 2017-05-07 DIAGNOSIS — R200 Anesthesia of skin: Secondary | ICD-10-CM

## 2017-05-07 DIAGNOSIS — M542 Cervicalgia: Secondary | ICD-10-CM

## 2017-05-07 NOTE — Patient Instructions (Signed)
Thank you for coming to see [place] at [facility]. I hope we have been able to provide you high quality care today.  You may receive a patient satisfaction survey over the next few weeks. We would appreciate your feedback and comments so that we may continue to improve ourselves and the health of our patients.   - check MRI cervical spine  - check EMG/NCS - continue diabetes treatment - follow up with rheumatology   ~~~~~~~~~~~~~~~~~~~~~~~~~~~~~~~~~~~~~~~~~~~~~~~~~~~~~~~~~~~~~~~~~  DR. PENUMALLI'S GUIDE TO HAPPY AND HEALTHY LIVING These are some of my general health and wellness recommendations. Some of them may apply to you better than others. Please use common sense as you try these suggestions and feel free to ask me any questions.   ACTIVITY/FITNESS Mental, social, emotional and physical stimulation are very important for brain and body health. Try learning a new activity (arts, music, language, sports, games).  Keep moving your body to the best of your abilities. You can do this at home, inside or outside, the park, community center, gym or anywhere you like. Consider a physical therapist or personal trainer to get started. Consider the app Sworkit. Fitness trackers such as smart-watches, smart-phones or Fitbits can help as well.   NUTRITION Eat more plants: colorful vegetables, nuts, seeds and berries.  Eat less sugar, salt, preservatives and processed foods.  Avoid toxins such as cigarettes and alcohol.  Drink water when you are thirsty. Warm water with a slice of lemon is an excellent morning drink to start the day.  Consider these websites for more information The Nutrition Source (https://www.henry-hernandez.biz/) Precision Nutrition (WindowBlog.ch)   RELAXATION Consider practicing mindfulness meditation or other relaxation techniques such as deep breathing, prayer, yoga, tai chi, massage. See website  mindful.org or the apps Headspace or Calm to help get started.   SLEEP Try to get at least 7-8+ hours sleep per day. Regular exercise and reduced caffeine will help you sleep better. Practice good sleep hygeine techniques. See website sleep.org for more information.   PLANNING Prepare estate planning, living will, healthcare POA documents. Sometimes this is best planned with the help of an attorney. Theconversationproject.org and agingwithdignity.org are excellent resources.
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
## 2017-05-08 DIAGNOSIS — F3132 Bipolar disorder, current episode depressed, moderate: Secondary | ICD-10-CM | POA: Diagnosis not present

## 2017-05-09 DIAGNOSIS — Z5181 Encounter for therapeutic drug level monitoring: Secondary | ICD-10-CM | POA: Diagnosis not present

## 2017-05-16 DIAGNOSIS — E0843 Diabetes mellitus due to underlying condition with diabetic autonomic (poly)neuropathy: Secondary | ICD-10-CM | POA: Diagnosis not present

## 2017-05-16 DIAGNOSIS — M21611 Bunion of right foot: Secondary | ICD-10-CM | POA: Diagnosis not present

## 2017-05-16 DIAGNOSIS — L97519 Non-pressure chronic ulcer of other part of right foot with unspecified severity: Secondary | ICD-10-CM | POA: Diagnosis not present

## 2017-05-16 DIAGNOSIS — L97911 Non-pressure chronic ulcer of unspecified part of right lower leg limited to breakdown of skin: Secondary | ICD-10-CM | POA: Diagnosis not present

## 2017-05-17 ENCOUNTER — Ambulatory Visit
Admission: RE | Admit: 2017-05-17 | Discharge: 2017-05-17 | Disposition: A | Discharge status: Home / Self Care | Payer: Medicare Other | Source: Ambulatory Visit | Attending: Diagnostic Neuroimaging | Admitting: Diagnostic Neuroimaging

## 2017-05-17 DIAGNOSIS — M542 Cervicalgia: Secondary | ICD-10-CM | POA: Diagnosis not present

## 2017-05-17 DIAGNOSIS — R200 Anesthesia of skin: Secondary | ICD-10-CM

## 2017-05-17 DIAGNOSIS — R202 Paresthesia of skin: Secondary | ICD-10-CM

## 2017-05-22 DIAGNOSIS — F3132 Bipolar disorder, current episode depressed, moderate: Secondary | ICD-10-CM | POA: Diagnosis not present

## 2017-05-23 DIAGNOSIS — L97529 Non-pressure chronic ulcer of other part of left foot with unspecified severity: Secondary | ICD-10-CM | POA: Diagnosis not present

## 2017-05-23 DIAGNOSIS — M2042 Other hammer toe(s) (acquired), left foot: Secondary | ICD-10-CM | POA: Diagnosis not present

## 2017-05-23 DIAGNOSIS — E0843 Diabetes mellitus due to underlying condition with diabetic autonomic (poly)neuropathy: Secondary | ICD-10-CM | POA: Diagnosis not present

## 2017-05-23 DIAGNOSIS — M2041 Other hammer toe(s) (acquired), right foot: Secondary | ICD-10-CM | POA: Diagnosis not present

## 2017-05-23 DIAGNOSIS — M21611 Bunion of right foot: Secondary | ICD-10-CM | POA: Diagnosis not present

## 2017-05-25 ENCOUNTER — Encounter (HOSPITAL_BASED_OUTPATIENT_CLINIC_OR_DEPARTMENT_OTHER): Payer: Self-pay | Admitting: *Deleted

## 2017-05-25 NOTE — Progress Notes (Signed)
NPO AFTER MN W/ EXCEPTION CLEAR LIQUIDS UNTIL 0800 (NO CREAM/ MILK PRODUCTS).  ARRIVE AT 1230.  NEEDS ISTAT 8.  CURRENT EKG IN CHART AND EPIC.  WILL TAKE AM MEDS W/ EXCEPTION NO BRILLINTA/ASA, DOS W/ SIPS OF WATER AND IF NEEDED TAKE HYDROCODONE.  PER NOTE FROM Raelyn Ensign RN , DR Noreene Larsson MDA REVIEWED CHART YESTERDAY OK FOR [facility].

## 2017-05-28 ENCOUNTER — Ambulatory Visit (HOSPITAL_BASED_OUTPATIENT_CLINIC_OR_DEPARTMENT_OTHER): Payer: Medicare Other | Admitting: Anesthesiology

## 2017-05-28 ENCOUNTER — Encounter (HOSPITAL_BASED_OUTPATIENT_CLINIC_OR_DEPARTMENT_OTHER)
Admission: RE | Disposition: A | Discharge status: Home / Self Care | Payer: Self-pay | Source: Ambulatory Visit | Attending: Podiatry

## 2017-05-28 ENCOUNTER — Encounter (HOSPITAL_BASED_OUTPATIENT_CLINIC_OR_DEPARTMENT_OTHER): Payer: Self-pay | Admitting: *Deleted

## 2017-05-28 ENCOUNTER — Ambulatory Visit (HOSPITAL_BASED_OUTPATIENT_CLINIC_OR_DEPARTMENT_OTHER)
Admission: RE | Admit: 2017-05-28 | Discharge: 2017-05-28 | Disposition: A | Discharge status: Home / Self Care | Payer: Medicare Other | Source: Ambulatory Visit | Attending: Podiatry | Admitting: Podiatry

## 2017-05-28 DIAGNOSIS — Z951 Presence of aortocoronary bypass graft: Secondary | ICD-10-CM | POA: Diagnosis not present

## 2017-05-28 DIAGNOSIS — I10 Essential (primary) hypertension: Secondary | ICD-10-CM | POA: Insufficient documentation

## 2017-05-28 DIAGNOSIS — E1151 Type 2 diabetes mellitus with diabetic peripheral angiopathy without gangrene: Secondary | ICD-10-CM | POA: Insufficient documentation

## 2017-05-28 DIAGNOSIS — Z955 Presence of coronary angioplasty implant and graft: Secondary | ICD-10-CM | POA: Insufficient documentation

## 2017-05-28 DIAGNOSIS — L97529 Non-pressure chronic ulcer of other part of left foot with unspecified severity: Secondary | ICD-10-CM | POA: Insufficient documentation

## 2017-05-28 DIAGNOSIS — Z7952 Long term (current) use of systemic steroids: Secondary | ICD-10-CM | POA: Insufficient documentation

## 2017-05-28 DIAGNOSIS — Z89412 Acquired absence of left great toe: Secondary | ICD-10-CM | POA: Insufficient documentation

## 2017-05-28 DIAGNOSIS — Z79891 Long term (current) use of opiate analgesic: Secondary | ICD-10-CM | POA: Insufficient documentation

## 2017-05-28 DIAGNOSIS — Z79899 Other long term (current) drug therapy: Secondary | ICD-10-CM | POA: Insufficient documentation

## 2017-05-28 DIAGNOSIS — Z833 Family history of diabetes mellitus: Secondary | ICD-10-CM | POA: Insufficient documentation

## 2017-05-28 DIAGNOSIS — M069 Rheumatoid arthritis, unspecified: Secondary | ICD-10-CM | POA: Diagnosis not present

## 2017-05-28 DIAGNOSIS — E785 Hyperlipidemia, unspecified: Secondary | ICD-10-CM | POA: Insufficient documentation

## 2017-05-28 DIAGNOSIS — M86172 Other acute osteomyelitis, left ankle and foot: Secondary | ICD-10-CM | POA: Insufficient documentation

## 2017-05-28 DIAGNOSIS — F319 Bipolar disorder, unspecified: Secondary | ICD-10-CM | POA: Diagnosis not present

## 2017-05-28 DIAGNOSIS — G4733 Obstructive sleep apnea (adult) (pediatric): Secondary | ICD-10-CM | POA: Insufficient documentation

## 2017-05-28 DIAGNOSIS — Z791 Long term (current) use of non-steroidal anti-inflammatories (NSAID): Secondary | ICD-10-CM | POA: Diagnosis not present

## 2017-05-28 DIAGNOSIS — E1169 Type 2 diabetes mellitus with other specified complication: Secondary | ICD-10-CM | POA: Insufficient documentation

## 2017-05-28 DIAGNOSIS — M159 Polyosteoarthritis, unspecified: Secondary | ICD-10-CM | POA: Insufficient documentation

## 2017-05-28 DIAGNOSIS — Z7984 Long term (current) use of oral hypoglycemic drugs: Secondary | ICD-10-CM | POA: Insufficient documentation

## 2017-05-28 DIAGNOSIS — I2510 Atherosclerotic heart disease of native coronary artery without angina pectoris: Secondary | ICD-10-CM | POA: Insufficient documentation

## 2017-05-28 DIAGNOSIS — M869 Osteomyelitis, unspecified: Secondary | ICD-10-CM | POA: Diagnosis not present

## 2017-05-28 DIAGNOSIS — I252 Old myocardial infarction: Secondary | ICD-10-CM | POA: Insufficient documentation

## 2017-05-28 DIAGNOSIS — E11621 Type 2 diabetes mellitus with foot ulcer: Secondary | ICD-10-CM | POA: Diagnosis not present

## 2017-05-28 DIAGNOSIS — Z7982 Long term (current) use of aspirin: Secondary | ICD-10-CM | POA: Insufficient documentation

## 2017-05-28 DIAGNOSIS — K219 Gastro-esophageal reflux disease without esophagitis: Secondary | ICD-10-CM | POA: Insufficient documentation

## 2017-05-28 HISTORY — DX: Osteomyelitis, unspecified: M86.9

## 2017-05-28 HISTORY — DX: Male erectile dysfunction, unspecified: N52.9

## 2017-05-28 HISTORY — DX: Ischemic cardiomyopathy: I25.5

## 2017-05-28 HISTORY — DX: Presence of coronary angioplasty implant and graft: Z95.5

## 2017-05-28 HISTORY — DX: Personal history of other specified conditions: Z87.898

## 2017-05-28 HISTORY — DX: Opioid use, unspecified, uncomplicated: F11.90

## 2017-05-28 HISTORY — DX: Presence of dental prosthetic device (complete) (partial): Z97.2

## 2017-05-28 HISTORY — DX: Paresthesia of skin: R20.0

## 2017-05-28 HISTORY — DX: Chronic pain syndrome: G89.4

## 2017-05-28 HISTORY — PX: AMPUTATION TOE: SHX6595

## 2017-05-28 HISTORY — DX: Heart failure, unspecified: I50.9

## 2017-05-28 HISTORY — DX: Personal history of (healed) traumatic fracture: Z87.81

## 2017-05-28 HISTORY — DX: Non-pressure chronic ulcer of other part of unspecified foot with unspecified severity: L97.509

## 2017-05-28 HISTORY — DX: Personal history of other diseases of the musculoskeletal system and connective tissue: Z87.39

## 2017-05-28 HISTORY — DX: Other forms of angina pectoris: I20.8

## 2017-05-28 HISTORY — DX: Unspecified symptoms and signs involving the genitourinary system: R39.9

## 2017-05-28 HISTORY — DX: Cervicalgia: M54.2

## 2017-05-28 HISTORY — DX: Old myocardial infarction: I25.2

## 2017-05-28 HISTORY — DX: Other forms of angina pectoris: I20.89

## 2017-05-28 HISTORY — DX: Type 2 diabetes mellitus with foot ulcer: E11.621

## 2017-05-28 HISTORY — DX: Mixed hyperlipidemia: E78.2

## 2017-05-28 HISTORY — DX: Presence of aortocoronary bypass graft: Z95.1

## 2017-05-28 HISTORY — DX: Other psychoactive substance use, unspecified, in remission: F19.91

## 2017-05-28 HISTORY — DX: Paresthesia of skin: R20.2

## 2017-05-28 LAB — GLUCOSE, CAPILLARY: GLUCOSE-CAPILLARY: 237 mg/dL — AB (ref 65–99)

## 2017-05-28 LAB — POCT I-STAT, CHEM 8
BUN: 23 mg/dL — ABNORMAL HIGH (ref 6–20)
CHLORIDE: 103 mmol/L (ref 101–111)
Calcium, Ion: 1.26 mmol/L (ref 1.15–1.40)
Creatinine, Ser: 0.8 mg/dL (ref 0.61–1.24)
Glucose, Bld: 293 mg/dL — ABNORMAL HIGH (ref 65–99)
HEMATOCRIT: 39 % (ref 39.0–52.0)
HEMOGLOBIN: 13.3 g/dL (ref 13.0–17.0)
POTASSIUM: 4.6 mmol/L (ref 3.5–5.1)
SODIUM: 136 mmol/L (ref 135–145)
TCO2: 25 mmol/L (ref 0–100)

## 2017-05-28 SURGERY — AMPUTATION, TOE
Anesthesia: General | Site: Foot | Laterality: Left

## 2017-05-28 MED ORDER — FENTANYL CITRATE (PF) 100 MCG/2ML IJ SOLN
INTRAMUSCULAR | Status: DC | PRN
  Administered 2017-05-28: 50 ug via INTRAVENOUS
  Administered 2017-05-28 (×2): 25 ug via INTRAVENOUS

## 2017-05-28 MED ORDER — LIDOCAINE 2% (20 MG/ML) 5 ML SYRINGE
INTRAMUSCULAR | Status: DC | PRN
  Administered 2017-05-28: 100 mg via INTRAVENOUS

## 2017-05-28 MED ORDER — CEFAZOLIN SODIUM-DEXTROSE 2-3 GM-% IV SOLR
INTRAVENOUS | Status: DC | PRN
  Administered 2017-05-28: 2 g via INTRAVENOUS

## 2017-05-28 MED ORDER — ONDANSETRON HCL 4 MG/2ML IJ SOLN
INTRAMUSCULAR | Status: AC
  Filled 2017-05-28: qty 2

## 2017-05-28 MED ORDER — ONDANSETRON HCL 4 MG/2ML IJ SOLN
INTRAMUSCULAR | Status: DC | PRN
  Administered 2017-05-28: 4 mg via INTRAVENOUS

## 2017-05-28 MED ORDER — OXYCODONE HCL 5 MG PO TABS
5.0000 mg | ORAL_TABLET | Freq: Once | ORAL | Status: DC | PRN
  Filled 2017-05-28: qty 1

## 2017-05-28 MED ORDER — BUPIVACAINE HCL 0.5 % IJ SOLN
INTRAMUSCULAR | Status: DC | PRN
  Administered 2017-05-28: 10 mL

## 2017-05-28 MED ORDER — MIDAZOLAM HCL 2 MG/2ML IJ SOLN
INTRAMUSCULAR | Status: AC
  Filled 2017-05-28: qty 2

## 2017-05-28 MED ORDER — LACTATED RINGERS IV SOLN
INTRAVENOUS | Status: DC
  Administered 2017-05-28: 13:00:00 via INTRAVENOUS
  Filled 2017-05-28: qty 1000

## 2017-05-28 MED ORDER — MIDAZOLAM HCL 5 MG/5ML IJ SOLN
INTRAMUSCULAR | Status: DC | PRN
  Administered 2017-05-28: 2 mg via INTRAVENOUS

## 2017-05-28 MED ORDER — CHLORHEXIDINE GLUCONATE 4 % EX LIQD
1.0000 "application " | Freq: Once | CUTANEOUS | Status: DC
  Filled 2017-05-28: qty 15

## 2017-05-28 MED ORDER — PROMETHAZINE HCL 25 MG/ML IJ SOLN
6.2500 mg | INTRAMUSCULAR | Status: DC | PRN
  Filled 2017-05-28: qty 1

## 2017-05-28 MED ORDER — OXYCODONE HCL 5 MG/5ML PO SOLN
5.0000 mg | Freq: Once | ORAL | Status: DC | PRN
Start: 2017-05-28 — End: 2017-05-28
  Filled 2017-05-28: qty 5

## 2017-05-28 MED ORDER — FENTANYL CITRATE (PF) 100 MCG/2ML IJ SOLN
INTRAMUSCULAR | Status: AC
  Filled 2017-05-28: qty 2

## 2017-05-28 MED ORDER — PROPOFOL 10 MG/ML IV BOLUS
INTRAVENOUS | Status: AC
  Filled 2017-05-28: qty 20

## 2017-05-28 MED ORDER — CEFAZOLIN SODIUM-DEXTROSE 2-4 GM/100ML-% IV SOLN
INTRAVENOUS | Status: AC
  Filled 2017-05-28: qty 100

## 2017-05-28 MED ORDER — HYDROMORPHONE HCL 1 MG/ML IJ SOLN
0.2500 mg | INTRAMUSCULAR | Status: DC | PRN
  Filled 2017-05-28: qty 0.5

## 2017-05-28 MED ORDER — PROPOFOL 10 MG/ML IV BOLUS
INTRAVENOUS | Status: DC | PRN
  Administered 2017-05-28: 30 mg via INTRAVENOUS
  Administered 2017-05-28: 200 mg via INTRAVENOUS

## 2017-05-28 SURGICAL SUPPLY — 59 items
BANDAGE CO FLEX L/F 2IN X 5YD (GAUZE/BANDAGES/DRESSINGS) ×3 IMPLANT
BANDAGE ELASTIC 3 VELCRO ST LF (GAUZE/BANDAGES/DRESSINGS) ×3 IMPLANT
BANDAGE ELASTIC 6 VELCRO ST LF (GAUZE/BANDAGES/DRESSINGS) ×3 IMPLANT
BENZOIN TINCTURE PRP APPL 2/3 (GAUZE/BANDAGES/DRESSINGS) IMPLANT
BLADE AVERAGE 25MMX9MM (BLADE)
BLADE AVERAGE 25X9 (BLADE) IMPLANT
BLADE SURG 15 STRL LF DISP TIS (BLADE) ×3 IMPLANT
BLADE SURG 15 STRL SS (BLADE) ×6
BNDG CONFORM 2 STRL LF (GAUZE/BANDAGES/DRESSINGS) ×3 IMPLANT
BNDG CONFORM 3 STRL LF (GAUZE/BANDAGES/DRESSINGS) ×3 IMPLANT
BNDG ELASTIC 2X5.8 VLCR STR LF (GAUZE/BANDAGES/DRESSINGS) ×3 IMPLANT
BNDG ESMARK 4X9 LF (GAUZE/BANDAGES/DRESSINGS) ×3 IMPLANT
CLOSURE WOUND 1/4X4 (GAUZE/BANDAGES/DRESSINGS)
COVER BACK TABLE 60X90IN (DRAPES) ×3 IMPLANT
COVER MAYO STAND STRL (DRAPES) ×3 IMPLANT
DRAPE EXTREMITY T 121X128X90 (DRAPE) ×3 IMPLANT
DRAPE LG THREE QUARTER DISP (DRAPES) ×3 IMPLANT
DRSG EMULSION OIL 3X3 NADH (GAUZE/BANDAGES/DRESSINGS) ×3 IMPLANT
DURAPREP 26ML APPLICATOR (WOUND CARE) IMPLANT
ELECT REM PT RETURN 9FT ADLT (ELECTROSURGICAL) ×3
ELECTRODE REM PT RTRN 9FT ADLT (ELECTROSURGICAL) ×1 IMPLANT
GAUZE SPONGE 4X4 12PLY STRL LF (GAUZE/BANDAGES/DRESSINGS) ×3 IMPLANT
GAUZE SPONGE 4X4 16PLY XRAY LF (GAUZE/BANDAGES/DRESSINGS) IMPLANT
GAUZE XEROFORM 1X8 LF (GAUZE/BANDAGES/DRESSINGS) ×3 IMPLANT
GLOVE BIO SURGEON STRL SZ7.5 (GLOVE) ×3 IMPLANT
GLOVE INDICATOR 7.5 STRL GRN (GLOVE) ×3 IMPLANT
GOWN STRL REUS W/TWL LRG LVL3 (GOWN DISPOSABLE) ×3 IMPLANT
KIT RM TURNOVER CYSTO AR (KITS) ×3 IMPLANT
LOOP VESSEL MAXI BLUE (MISCELLANEOUS) IMPLANT
NDL SAFETY ECLIPSE 18X1.5 (NEEDLE) ×1 IMPLANT
NEEDLE 27GAX1X1/2 (NEEDLE) ×3 IMPLANT
NEEDLE HYPO 18GX1.5 SHARP (NEEDLE) ×2
NEEDLE HYPO 22GX1.5 SAFETY (NEEDLE) ×3 IMPLANT
NS IRRIG 500ML POUR BTL (IV SOLUTION) ×3 IMPLANT
PACK BASIN DAY SURGERY FS (CUSTOM PROCEDURE TRAY) ×3 IMPLANT
PADDING CAST ABS 4INX4YD NS (CAST SUPPLIES) ×2
PADDING CAST ABS COTTON 4X4 ST (CAST SUPPLIES) ×1 IMPLANT
PENCIL BUTTON HOLSTER BLD 10FT (ELECTRODE) ×3 IMPLANT
RASP SM TEAR CROSS CUT (RASP) ×3 IMPLANT
SPONGE GAUZE 4X4 12PLY (GAUZE/BANDAGES/DRESSINGS) ×6 IMPLANT
SPONGE LAP 4X18 X RAY DECT (DISPOSABLE) ×3 IMPLANT
STOCKINETTE 6  STRL (DRAPES) ×2
STOCKINETTE 6 STRL (DRAPES) ×1 IMPLANT
STRIP CLOSURE SKIN 1/4X4 (GAUZE/BANDAGES/DRESSINGS) IMPLANT
SUCTION FRAZIER HANDLE 10FR (MISCELLANEOUS) ×2
SUCTION TUBE FRAZIER 10FR DISP (MISCELLANEOUS) ×1 IMPLANT
SUT ETHILON 4 0 PS 2 18 (SUTURE) IMPLANT
SUT ETHILON 5 0 PS 2 18 (SUTURE) IMPLANT
SUT MNCRL AB 4-0 PS2 18 (SUTURE) ×3 IMPLANT
SUT VIC AB 3-0 SH 27 (SUTURE) ×2
SUT VIC AB 3-0 SH 27X BRD (SUTURE) ×1 IMPLANT
SUT VICRYL 4-0 PS2 18IN ABS (SUTURE) IMPLANT
SYR 3ML 18GX1 1/2 (SYRINGE) ×3 IMPLANT
SYR BULB 3OZ (MISCELLANEOUS) ×3 IMPLANT
SYR CONTROL 10ML LL (SYRINGE) ×6 IMPLANT
TUBE CONNECTING 12'X1/4 (SUCTIONS) ×1
TUBE CONNECTING 12X1/4 (SUCTIONS) ×2 IMPLANT
UNDERPAD 30X30 INCONTINENT (UNDERPADS AND DIAPERS) ×3 IMPLANT
WATER STERILE IRR 500ML POUR (IV SOLUTION) ×3 IMPLANT

## 2017-05-28 NOTE — Discharge Instructions (Signed)

## 2017-05-28 NOTE — Anesthesia Procedure Notes (Signed)
Procedure Name: LMA Insertion Date/Time: 05/28/2017 2:42 PM Performed by: Jessica Priest Pre-anesthesia Checklist: Patient identified, Emergency Drugs available, Suction available and Patient being monitored Patient Re-evaluated:Patient Re-evaluated prior to inductionOxygen Delivery Method: Circle system utilized Preoxygenation: Pre-oxygenation with 100% oxygen Intubation Type: IV induction Ventilation: Mask ventilation without difficulty LMA: LMA inserted LMA Size: 4.0 Number of attempts: 1 Airway Equipment and Method: Bite block Placement Confirmation: positive ETCO2 Tube secured with: Tape Dental Injury: Teeth and Oropharynx as per pre-operative assessment

## 2017-05-28 NOTE — Anesthesia Postprocedure Evaluation (Signed)
Anesthesia Post Note  Patient: Conservator, museum/gallery  Procedure(s) Performed: Procedure(s) (LRB): AMPUTATION LEFT SECOND TOE (Left)     Patient location during evaluation: PACU Anesthesia Type: General Level of consciousness: sedated Pain management: pain level controlled Vital Signs Assessment: post-procedure vital signs reviewed and stable Respiratory status: spontaneous breathing and respiratory function stable Cardiovascular status: stable Anesthetic complications: no    Last Vitals:  Vitals:   05/28/17 1545 05/28/17 1600  BP: 103/66 (!) 105/48  Pulse: 60 63  Resp: 15 14  Temp:      Last Pain:  Vitals:   05/28/17 1221  TempSrc: Oral                 Lilja Soland DANIEL

## 2017-05-28 NOTE — Op Note (Signed)
Surgeon:  Larey Dresser, DPM Assistant:  None Preop DX:  2nd left toe ulceration with osteomyelitis Postop DX:  Same Procedure:  Partial amputation to distal 2nd left toe Anesthesia:  LMA Specimen:  Cultures and pathology of distal 2nd left toe performed  Patient was brought to the OR and placed in the supine position at which time an LMA was administered.  A pneumatic ankle tourniquet was applied.  The patient was prepped and draped in the usual aseptic manner.  The touniquet was inflated.    A fish-mouth type incision was made at the level of the 2nd left DIPJ.  The incision was carefully dissected at this joint level, disarticulating the distal aspect of the 2nd left toe.  This was removed in it's entirety.  The area was irrigated with saline and care was used to protect NV structures.  The head of the middle phalanx was evaluated and appeared clean and free of infection.  Deep closure was performed with 4-0 Vicryl and skin closure with 5-0 nylon.  10CC of 0.5% Marcaine was injected for local anesthesia and postop pain control.    The foot was dressed with adaptic, gauze, Kling and coban.  The tourniquet was then deflated.  Attention was then made to the specimen where proximal section were sent for culture and the remaining 2nd left toe for pathology for evaluation.  The patient was sent to the PACU with vital signs stable and capillary refill time at presurgical levels.  No guarantees given.

## 2017-05-28 NOTE — Anesthesia Preprocedure Evaluation (Signed)
Anesthesia Evaluation  Patient identified by MRN, date of birth, ID band Patient awake    Reviewed: Allergy & Precautions, NPO status , Patient's Chart, lab work & pertinent test results, reviewed documented beta blocker date and time   Airway Mallampati: II  TM Distance: >3 FB Neck ROM: Full    Dental   Pulmonary sleep apnea , Current Smoker, former smoker,    breath sounds clear to auscultation       Cardiovascular hypertension, Pt. on medications + angina + CAD, + Past MI, + Cardiac Stents, + CABG and + Peripheral Vascular Disease   Rhythm:Regular Rate:Normal  Spoke with Dr. Royann Shivers. Pt on Twilight study with only 1week to go. Pt has not received Brilinta while in hospital. He states okay to restart after surgery today.   Neuro/Psych Depression Bipolar Disorder negative neurological ROS     GI/Hepatic Neg liver ROS, GERD  ,  Endo/Other  diabetes, Type 2, Insulin Dependent  Renal/GU negative Renal ROS     Musculoskeletal  (+) Arthritis ,   Abdominal   Peds  Hematology  (+) anemia ,   Anesthesia Other Findings   Reproductive/Obstetrics                             Lab Results  Component Value Date   WBC 8.1 11/25/2016   HGB 13.3 05/28/2017   HCT 39.0 05/28/2017   MCV 85.6 11/25/2016   PLT 231 11/25/2016   Lab Results  Component Value Date   CREATININE 0.80 05/28/2017   BUN 23 (H) 05/28/2017   NA 136 05/28/2017   K 4.6 05/28/2017   CL 103 05/28/2017   CO2 26 11/23/2016   Lab Results  Component Value Date   INR 1.15 11/21/2016   INR 0.90 05/17/2016   INR 1.34 01/20/2016    Anesthesia Physical  Anesthesia Plan  ASA: III  Anesthesia Plan: General   Post-op Pain Management:    Induction: Intravenous  PONV Risk Score and Plan: 1 and Ondansetron and Propofol  Airway Management Planned: LMA  Additional Equipment:   Intra-op Plan:   Post-operative Plan:   Informed  Consent: I have reviewed the patients History and Physical, chart, labs and discussed the procedure including the risks, benefits and alternatives for the proposed anesthesia with the patient or authorized representative who has indicated his/her understanding and acceptance.   Dental advisory given  Plan Discussed with: CRNA  Anesthesia Plan Comments:         Anesthesia Quick Evaluation

## 2017-05-28 NOTE — Transfer of Care (Signed)
Immediate Anesthesia Transfer of Care Note  Patient: Austin Arnold  Procedure(s) Performed: Procedure(s) (LRB): AMPUTATION LEFT SECOND TOE (Left)  Patient Location: PACU  Anesthesia Type: General  Level of Consciousness: awake, sedated, patient cooperative and responds to stimulation  Airway & Oxygen Therapy: Patient Spontanous Breathing and Patient connected to N[employer]  Post-op Assessment: Report given to PACU RN, Post -op Vital signs reviewed and stable and Patient moving all extremities  Post vital signs: Reviewed and stable  Complications: No apparent anesthesia complications

## 2017-05-29 ENCOUNTER — Encounter (HOSPITAL_BASED_OUTPATIENT_CLINIC_OR_DEPARTMENT_OTHER): Payer: Self-pay | Admitting: Podiatry

## 2017-06-01 ENCOUNTER — Telehealth: Payer: Self-pay | Admitting: *Deleted

## 2017-06-01 NOTE — Telephone Encounter (Signed)
Spoke with patient and informed him his MRI cervical spine showed multi-level degenerative changes with a pinched nerve at right C7. Advised he may consider conservative management with epidural injection or a referral to pain managementt or a neurosurgery consult. Patient stated he has been going to pain management for years and has had epidural injections in the past. He also has had previous surgery on the left side of his neck. Patient asked if he would see Dr Marjory Lies next Tues when he has NCS done. This RN advised Dr Anne Hahn will do study because Dr Marjory Lies is out of the office next week. Advised he can ask Dr Anne Hahn questions during the study. He stated he will wait until his NCS next Tues to decide if he wants to see a neurosurgeon. He verbalized understanding of call.

## 2017-06-04 DIAGNOSIS — M86172 Other acute osteomyelitis, left ankle and foot: Secondary | ICD-10-CM | POA: Diagnosis not present

## 2017-06-05 ENCOUNTER — Encounter: Payer: Medicare Other | Admitting: Neurology

## 2017-06-05 LAB — AEROBIC/ANAEROBIC CULTURE (SURGICAL/DEEP WOUND): GRAM STAIN: NONE SEEN

## 2017-06-05 LAB — AEROBIC/ANAEROBIC CULTURE W GRAM STAIN (SURGICAL/DEEP WOUND)

## 2017-06-12 ENCOUNTER — Ambulatory Visit (INDEPENDENT_AMBULATORY_CARE_PROVIDER_SITE_OTHER): Payer: Medicare Other | Admitting: Neurology

## 2017-06-12 ENCOUNTER — Encounter: Payer: Self-pay | Admitting: Neurology

## 2017-06-12 ENCOUNTER — Ambulatory Visit (INDEPENDENT_AMBULATORY_CARE_PROVIDER_SITE_OTHER): Payer: Self-pay | Admitting: Neurology

## 2017-06-12 DIAGNOSIS — R200 Anesthesia of skin: Secondary | ICD-10-CM

## 2017-06-12 DIAGNOSIS — R202 Paresthesia of skin: Secondary | ICD-10-CM

## 2017-06-12 DIAGNOSIS — M542 Cervicalgia: Secondary | ICD-10-CM

## 2017-06-12 DIAGNOSIS — G5603 Carpal tunnel syndrome, bilateral upper limbs: Secondary | ICD-10-CM

## 2017-06-12 HISTORY — DX: Carpal tunnel syndrome, bilateral upper limbs: G56.03

## 2017-06-12 NOTE — Procedures (Signed)
     HISTORY:  Austin Arnold is a 51 year old gentleman with a history of diabetes that is poorly controlled. The patient has a history of prior neck surgery with a C6-7 laminectomy in 2005. The patient reports a 2 or 3 month history of significant discomfort and numbness involving the right arm primarily, slight symptoms are noted on the left as well. The patient does have numbness in the feet associated with a diabetic peripheral neuropathy. He is being evaluated for a possible neuropathy or a cervical radiculopathy.  NERVE CONDUCTION STUDIES:  Nerve conduction studies were performed on both upper extremities. The distal motor latencies for the median nerves were markedly prolonged on the right, prolonged on the left, with a low motor amplitude on the right, slightly low on the left. Slowing was seen for these nerves bilaterally. The distal motor latency for the right ulnar nerve was prolonged, normal on the left. The motor amplitudes for the ulnar nerves are normal bilaterally with slowing seen for these nerves bilaterally above and below the elbow. The sensory latencies for the radial and ulnar nerves were prolonged bilaterally. The right median sensory latency was unobtainable, prolonged on the left. The F wave latencies for the ulnar nerves were prolonged bilaterally.  EMG STUDIES:  EMG study was performed on the right upper extremity:  The first dorsal interosseous muscle reveals 2 to 4 K units with full recruitment. No fibrillations or positive waves were noted. The abductor pollicis brevis muscle reveals 2 to 3 K units with decreased recruitment. No fibrillations or positive waves were noted. The extensor indicis proprius muscle reveals 1 to 4 K units with full recruitment. No fibrillations or positive waves were noted. The pronator teres muscle reveals 2 to 3 K units with full recruitment. No fibrillations or positive waves were noted. The biceps muscle reveals 1 to 2 K units with full  recruitment. No fibrillations or positive waves were noted. The triceps muscle reveals 2 to 4 K units with full recruitment. No fibrillations or positive waves were noted. The anterior deltoid muscle reveals 2 to 3 K units with full recruitment. No fibrillations or positive waves were noted. The cervical paraspinal muscles were tested at 2 levels. No abnormalities of insertional activity were seen at either level tested. There was fair relaxation.   IMPRESSION:  Nerve conduction studies done on both upper extremities shows evidence of a right carpal tunnel syndrome of moderate severity and a mild left carpal tunnel syndrome. There appears to be diffuse slowing and sensory abnormalities throughout the upper extremities suggesting the presence of overlying peripheral neuropathy. EMG evaluation of the right upper extremity shows no evidence of an overlying cervical radiculopathy.  Marlan Palau MD 06/12/2017 10:19 AM  [facility] [address] [place], [place] [address]  Phone [phone] Fax [phone]

## 2017-06-12 NOTE — Progress Notes (Signed)
Please refer to EMG and nerve conduction study procedure note. 

## 2017-06-18 ENCOUNTER — Other Ambulatory Visit: Payer: Self-pay | Admitting: Cardiovascular Disease

## 2017-06-18 ENCOUNTER — Telehealth: Payer: Self-pay | Admitting: Diagnostic Neuroimaging

## 2017-06-18 DIAGNOSIS — G5603 Carpal tunnel syndrome, bilateral upper limbs: Secondary | ICD-10-CM

## 2017-06-18 NOTE — Telephone Encounter (Signed)
Pt is wanting a referral to a Hydrographic surveyor. Please call

## 2017-06-19 ENCOUNTER — Other Ambulatory Visit: Payer: Self-pay

## 2017-06-19 MED ORDER — ROSUVASTATIN CALCIUM 40 MG PO TABS: 40.0000 mg | ORAL_TABLET | Freq: Every evening | ORAL | 0 refills | Status: DC

## 2017-06-21 NOTE — Telephone Encounter (Signed)
LMVM for pt that referral placed for hand surgeon.  He is to call back if questions.

## 2017-06-28 ENCOUNTER — Telehealth: Payer: Self-pay | Admitting: Diagnostic Neuroimaging

## 2017-06-28 NOTE — Telephone Encounter (Signed)
Patient called and relayed he could get into [facility] for a sooner apt. [facility] doctor is still reviewing his chart.  Referral sent to [facility] per patient request.

## 2017-06-29 DIAGNOSIS — G5601 Carpal tunnel syndrome, right upper limb: Secondary | ICD-10-CM | POA: Diagnosis not present

## 2017-07-02 ENCOUNTER — Other Ambulatory Visit: Payer: Self-pay | Admitting: Orthopedic Surgery

## 2017-07-05 DIAGNOSIS — F3132 Bipolar disorder, current episode depressed, moderate: Secondary | ICD-10-CM | POA: Diagnosis not present

## 2017-07-11 ENCOUNTER — Telehealth: Payer: Self-pay

## 2017-07-11 ENCOUNTER — Encounter: Payer: Self-pay | Admitting: Cardiovascular Disease

## 2017-07-11 NOTE — Telephone Encounter (Signed)
1. Type of surgery: right carpal tunnel release 2. Date of surgery: 07/19/17 3. Surgeon: Dr Betha Loa 4. Medications that need to be held & how long: NA 5. Fax and/or Phone: (p) [phone] (f) [phone]

## 2017-07-11 NOTE — Telephone Encounter (Signed)
epicd 

## 2017-07-12 DIAGNOSIS — F3132 Bipolar disorder, current episode depressed, moderate: Secondary | ICD-10-CM | POA: Diagnosis not present

## 2017-07-12 NOTE — Telephone Encounter (Signed)
Clearance letter faxed via epic. 

## 2017-07-17 ENCOUNTER — Encounter: Payer: Self-pay | Admitting: Cardiology

## 2017-07-17 ENCOUNTER — Encounter (HOSPITAL_BASED_OUTPATIENT_CLINIC_OR_DEPARTMENT_OTHER): Payer: Self-pay | Admitting: *Deleted

## 2017-07-17 ENCOUNTER — Ambulatory Visit (INDEPENDENT_AMBULATORY_CARE_PROVIDER_SITE_OTHER): Payer: Medicare Other | Admitting: Cardiology

## 2017-07-17 ENCOUNTER — Encounter (HOSPITAL_BASED_OUTPATIENT_CLINIC_OR_DEPARTMENT_OTHER)
Admission: RE | Admit: 2017-07-17 | Discharge: 2017-07-17 | Disposition: A | Discharge status: Home / Self Care | Payer: Medicare Other | Source: Ambulatory Visit | Attending: Orthopedic Surgery | Admitting: Orthopedic Surgery

## 2017-07-17 DIAGNOSIS — E785 Hyperlipidemia, unspecified: Secondary | ICD-10-CM | POA: Diagnosis not present

## 2017-07-17 DIAGNOSIS — I25708 Atherosclerosis of coronary artery bypass graft(s), unspecified, with other forms of angina pectoris: Secondary | ICD-10-CM

## 2017-07-17 DIAGNOSIS — I2510 Atherosclerotic heart disease of native coronary artery without angina pectoris: Secondary | ICD-10-CM | POA: Diagnosis not present

## 2017-07-17 DIAGNOSIS — I255 Ischemic cardiomyopathy: Secondary | ICD-10-CM | POA: Diagnosis not present

## 2017-07-17 DIAGNOSIS — G4733 Obstructive sleep apnea (adult) (pediatric): Secondary | ICD-10-CM | POA: Diagnosis not present

## 2017-07-17 DIAGNOSIS — E782 Mixed hyperlipidemia: Secondary | ICD-10-CM | POA: Diagnosis not present

## 2017-07-17 DIAGNOSIS — E119 Type 2 diabetes mellitus without complications: Secondary | ICD-10-CM

## 2017-07-17 DIAGNOSIS — G4730 Sleep apnea, unspecified: Secondary | ICD-10-CM | POA: Diagnosis not present

## 2017-07-17 DIAGNOSIS — Z8659 Personal history of other mental and behavioral disorders: Secondary | ICD-10-CM | POA: Diagnosis not present

## 2017-07-17 DIAGNOSIS — M069 Rheumatoid arthritis, unspecified: Secondary | ICD-10-CM | POA: Diagnosis not present

## 2017-07-17 DIAGNOSIS — Z955 Presence of coronary angioplasty implant and graft: Secondary | ICD-10-CM | POA: Diagnosis not present

## 2017-07-17 DIAGNOSIS — K219 Gastro-esophageal reflux disease without esophagitis: Secondary | ICD-10-CM | POA: Diagnosis not present

## 2017-07-17 DIAGNOSIS — I208 Other forms of angina pectoris: Secondary | ICD-10-CM

## 2017-07-17 DIAGNOSIS — G894 Chronic pain syndrome: Secondary | ICD-10-CM | POA: Diagnosis not present

## 2017-07-17 DIAGNOSIS — I10 Essential (primary) hypertension: Secondary | ICD-10-CM | POA: Diagnosis not present

## 2017-07-17 DIAGNOSIS — Z794 Long term (current) use of insulin: Secondary | ICD-10-CM | POA: Diagnosis not present

## 2017-07-17 DIAGNOSIS — G5603 Carpal tunnel syndrome, bilateral upper limbs: Secondary | ICD-10-CM | POA: Diagnosis not present

## 2017-07-17 DIAGNOSIS — I2089 Other forms of angina pectoris: Secondary | ICD-10-CM

## 2017-07-17 DIAGNOSIS — R0789 Other chest pain: Secondary | ICD-10-CM | POA: Diagnosis not present

## 2017-07-17 DIAGNOSIS — F319 Bipolar disorder, unspecified: Secondary | ICD-10-CM | POA: Diagnosis not present

## 2017-07-17 DIAGNOSIS — I252 Old myocardial infarction: Secondary | ICD-10-CM | POA: Diagnosis not present

## 2017-07-17 DIAGNOSIS — Z882 Allergy status to sulfonamides status: Secondary | ICD-10-CM | POA: Diagnosis not present

## 2017-07-17 DIAGNOSIS — M1990 Unspecified osteoarthritis, unspecified site: Secondary | ICD-10-CM | POA: Diagnosis not present

## 2017-07-17 DIAGNOSIS — I209 Angina pectoris, unspecified: Secondary | ICD-10-CM

## 2017-07-17 DIAGNOSIS — I509 Heart failure, unspecified: Secondary | ICD-10-CM | POA: Diagnosis not present

## 2017-07-17 DIAGNOSIS — Z89412 Acquired absence of left great toe: Secondary | ICD-10-CM | POA: Diagnosis not present

## 2017-07-17 DIAGNOSIS — F17210 Nicotine dependence, cigarettes, uncomplicated: Secondary | ICD-10-CM | POA: Diagnosis not present

## 2017-07-17 DIAGNOSIS — E1151 Type 2 diabetes mellitus with diabetic peripheral angiopathy without gangrene: Secondary | ICD-10-CM | POA: Diagnosis not present

## 2017-07-17 LAB — BASIC METABOLIC PANEL
Anion gap: 7 (ref 5–15)
BUN: 9 mg/dL (ref 6–20)
CO2: 25 mmol/L (ref 22–32)
CREATININE: 0.93 mg/dL (ref 0.61–1.24)
Calcium: 9.2 mg/dL (ref 8.9–10.3)
Chloride: 102 mmol/L (ref 101–111)
GFR calc Af Amer: >60 mL/min (ref >60)
GLUCOSE: 228 mg/dL — AB (ref 65–99)
Potassium: 4.7 mmol/L (ref 3.5–5.1)
SODIUM: 134 mmol/L — AB (ref 135–145)

## 2017-07-17 MED ORDER — METOPROLOL TARTRATE 50 MG PO TABS: 50.0000 mg | ORAL_TABLET | Freq: Two times a day (BID) | ORAL | 3 refills | Status: DC

## 2017-07-17 MED ORDER — AMLODIPINE BESYLATE 2.5 MG PO TABS: 2.5000 mg | ORAL_TABLET | Freq: Every day | ORAL | 3 refills | Status: DC

## 2017-07-17 MED ORDER — ISOSORBIDE MONONITRATE ER 30 MG PO TB24: ORAL_TABLET | ORAL | 3 refills | Status: DC

## 2017-07-17 MED ORDER — NITROGLYCERIN 0.4 MG SL SUBL: SUBLINGUAL_TABLET | SUBLINGUAL | 11 refills | Status: DC

## 2017-07-17 NOTE — Assessment & Plan Note (Signed)
On high dose statin Rx 
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
## 2017-07-17 NOTE — Progress Notes (Signed)
Chart reviewed by Dr Turk, OK for DSC. 

## 2017-07-17 NOTE — Assessment & Plan Note (Signed)
Poorly controlled, osteomyelitis in Dec 2017- s/p Lt great toe amputation

## 2017-07-17 NOTE — Assessment & Plan Note (Signed)
CABG  X 22 Jan 2016, 3/4 grafts occluded June 2017- S/p complex CFX and OM PCI with DES 05/19/16- Failed PCI RI 05/30/17

## 2017-07-17 NOTE — Assessment & Plan Note (Signed)
Stable

## 2017-07-17 NOTE — Assessment & Plan Note (Signed)
Besides chronic angina the pt has a history of generalized chronic pain. He has taken steroids PRN. Previously followed at a pain clinic, now followed by his PCP for this.

## 2017-07-17 NOTE — Patient Instructions (Signed)
Start Amlodipine 2.5 mg daily   Increase Imdur 60 mg in morning and 30 mg in afternoon    Keep appointment with Dr.Croitoru Friday 09/07/17 at 9:40 am.

## 2017-07-17 NOTE — Assessment & Plan Note (Signed)
Pt continues to have chest pain. He thinks the Ranexa helped but he is out of work and could not afford it

## 2017-07-18 DIAGNOSIS — F3132 Bipolar disorder, current episode depressed, moderate: Secondary | ICD-10-CM | POA: Diagnosis not present

## 2017-07-19 ENCOUNTER — Encounter (HOSPITAL_BASED_OUTPATIENT_CLINIC_OR_DEPARTMENT_OTHER)
Admission: RE | Disposition: A | Discharge status: Home / Self Care | Payer: Self-pay | Source: Ambulatory Visit | Attending: Orthopedic Surgery

## 2017-07-19 ENCOUNTER — Ambulatory Visit (HOSPITAL_BASED_OUTPATIENT_CLINIC_OR_DEPARTMENT_OTHER): Payer: Medicare Other | Admitting: Anesthesiology

## 2017-07-19 ENCOUNTER — Encounter (HOSPITAL_BASED_OUTPATIENT_CLINIC_OR_DEPARTMENT_OTHER): Payer: Self-pay | Admitting: Anesthesiology

## 2017-07-19 ENCOUNTER — Ambulatory Visit (HOSPITAL_BASED_OUTPATIENT_CLINIC_OR_DEPARTMENT_OTHER)
Admission: RE | Admit: 2017-07-19 | Discharge: 2017-07-19 | Disposition: A | Discharge status: Home / Self Care | Payer: Medicare Other | Source: Ambulatory Visit | Attending: Orthopedic Surgery | Admitting: Orthopedic Surgery

## 2017-07-19 DIAGNOSIS — M069 Rheumatoid arthritis, unspecified: Secondary | ICD-10-CM | POA: Insufficient documentation

## 2017-07-19 DIAGNOSIS — K219 Gastro-esophageal reflux disease without esophagitis: Secondary | ICD-10-CM | POA: Insufficient documentation

## 2017-07-19 DIAGNOSIS — M1990 Unspecified osteoarthritis, unspecified site: Secondary | ICD-10-CM | POA: Insufficient documentation

## 2017-07-19 DIAGNOSIS — I2510 Atherosclerotic heart disease of native coronary artery without angina pectoris: Secondary | ICD-10-CM | POA: Insufficient documentation

## 2017-07-19 DIAGNOSIS — E1151 Type 2 diabetes mellitus with diabetic peripheral angiopathy without gangrene: Secondary | ICD-10-CM | POA: Insufficient documentation

## 2017-07-19 DIAGNOSIS — I25709 Atherosclerosis of coronary artery bypass graft(s), unspecified, with unspecified angina pectoris: Secondary | ICD-10-CM | POA: Diagnosis not present

## 2017-07-19 DIAGNOSIS — G4730 Sleep apnea, unspecified: Secondary | ICD-10-CM | POA: Insufficient documentation

## 2017-07-19 DIAGNOSIS — G5601 Carpal tunnel syndrome, right upper limb: Secondary | ICD-10-CM | POA: Diagnosis not present

## 2017-07-19 DIAGNOSIS — I252 Old myocardial infarction: Secondary | ICD-10-CM | POA: Diagnosis not present

## 2017-07-19 DIAGNOSIS — Z89412 Acquired absence of left great toe: Secondary | ICD-10-CM | POA: Insufficient documentation

## 2017-07-19 DIAGNOSIS — G5603 Carpal tunnel syndrome, bilateral upper limbs: Secondary | ICD-10-CM | POA: Diagnosis not present

## 2017-07-19 DIAGNOSIS — F17210 Nicotine dependence, cigarettes, uncomplicated: Secondary | ICD-10-CM | POA: Diagnosis not present

## 2017-07-19 DIAGNOSIS — F319 Bipolar disorder, unspecified: Secondary | ICD-10-CM | POA: Insufficient documentation

## 2017-07-19 DIAGNOSIS — I509 Heart failure, unspecified: Secondary | ICD-10-CM | POA: Diagnosis not present

## 2017-07-19 DIAGNOSIS — M869 Osteomyelitis, unspecified: Secondary | ICD-10-CM | POA: Diagnosis not present

## 2017-07-19 DIAGNOSIS — Z794 Long term (current) use of insulin: Secondary | ICD-10-CM | POA: Diagnosis not present

## 2017-07-19 DIAGNOSIS — I255 Ischemic cardiomyopathy: Secondary | ICD-10-CM | POA: Insufficient documentation

## 2017-07-19 DIAGNOSIS — I10 Essential (primary) hypertension: Secondary | ICD-10-CM | POA: Insufficient documentation

## 2017-07-19 DIAGNOSIS — Z882 Allergy status to sulfonamides status: Secondary | ICD-10-CM | POA: Insufficient documentation

## 2017-07-19 DIAGNOSIS — G4733 Obstructive sleep apnea (adult) (pediatric): Secondary | ICD-10-CM | POA: Insufficient documentation

## 2017-07-19 DIAGNOSIS — E782 Mixed hyperlipidemia: Secondary | ICD-10-CM | POA: Insufficient documentation

## 2017-07-19 DIAGNOSIS — E1169 Type 2 diabetes mellitus with other specified complication: Secondary | ICD-10-CM | POA: Diagnosis not present

## 2017-07-19 DIAGNOSIS — Z955 Presence of coronary angioplasty implant and graft: Secondary | ICD-10-CM | POA: Insufficient documentation

## 2017-07-19 DIAGNOSIS — G894 Chronic pain syndrome: Secondary | ICD-10-CM | POA: Insufficient documentation

## 2017-07-19 HISTORY — PX: CARPAL TUNNEL RELEASE: SHX101

## 2017-07-19 HISTORY — DX: Peripheral vascular disease, unspecified: I73.9

## 2017-07-19 LAB — GLUCOSE, CAPILLARY
Glucose-Capillary: 196 mg/dL — ABNORMAL HIGH (ref 65–99)
Glucose-Capillary: 156 mg/dL — ABNORMAL HIGH (ref 65–99)

## 2017-07-19 SURGERY — CARPAL TUNNEL RELEASE
Anesthesia: Monitor Anesthesia Care | Site: Wrist | Laterality: Right

## 2017-07-19 MED ORDER — MIDAZOLAM HCL 2 MG/2ML IJ SOLN
1.0000 mg | INTRAMUSCULAR | Status: DC | PRN
  Administered 2017-07-19: 2 mg via INTRAVENOUS

## 2017-07-19 MED ORDER — HYDROMORPHONE HCL 1 MG/ML IJ SOLN
INTRAMUSCULAR | Status: AC
  Filled 2017-07-19: qty 0.5

## 2017-07-19 MED ORDER — FENTANYL CITRATE (PF) 100 MCG/2ML IJ SOLN
50.0000 ug | INTRAMUSCULAR | Status: DC | PRN
  Administered 2017-07-19: 100 ug via INTRAVENOUS

## 2017-07-19 MED ORDER — PROMETHAZINE HCL 25 MG/ML IJ SOLN: 6.2500 mg | INTRAMUSCULAR | Status: DC | PRN

## 2017-07-19 MED ORDER — HYDROMORPHONE HCL 1 MG/ML IJ SOLN
0.5000 mg | INTRAMUSCULAR | Status: DC | PRN
  Administered 2017-07-19: 0.5 mg via INTRAVENOUS

## 2017-07-19 MED ORDER — CEFAZOLIN SODIUM-DEXTROSE 2-4 GM/100ML-% IV SOLN
2.0000 g | INTRAVENOUS | Status: AC
  Administered 2017-07-19: 2 g via INTRAVENOUS

## 2017-07-19 MED ORDER — PROPOFOL 500 MG/50ML IV EMUL
INTRAVENOUS | Status: DC | PRN
  Administered 2017-07-19: 25 ug/kg/min via INTRAVENOUS

## 2017-07-19 MED ORDER — BUPIVACAINE HCL (PF) 0.25 % IJ SOLN
INTRAMUSCULAR | Status: DC | PRN
  Administered 2017-07-19: 10 mL

## 2017-07-19 MED ORDER — DEXAMETHASONE SODIUM PHOSPHATE 10 MG/ML IJ SOLN
INTRAMUSCULAR | Status: AC
  Filled 2017-07-19: qty 1

## 2017-07-19 MED ORDER — DEXAMETHASONE SODIUM PHOSPHATE 10 MG/ML IJ SOLN
INTRAMUSCULAR | Status: DC | PRN
  Administered 2017-07-19: 10 mg via INTRAVENOUS

## 2017-07-19 MED ORDER — ONDANSETRON HCL 4 MG/2ML IJ SOLN
INTRAMUSCULAR | Status: DC | PRN
  Administered 2017-07-19: 4 mg via INTRAVENOUS

## 2017-07-19 MED ORDER — ONDANSETRON HCL 4 MG/2ML IJ SOLN
INTRAMUSCULAR | Status: AC
  Filled 2017-07-19: qty 2

## 2017-07-19 MED ORDER — HYDROMORPHONE HCL 1 MG/ML IJ SOLN: 0.2500 mg | INTRAMUSCULAR | Status: DC | PRN

## 2017-07-19 MED ORDER — MIDAZOLAM HCL 2 MG/2ML IJ SOLN
INTRAMUSCULAR | Status: AC
  Filled 2017-07-19: qty 2

## 2017-07-19 MED ORDER — LIDOCAINE 2% (20 MG/ML) 5 ML SYRINGE
INTRAMUSCULAR | Status: DC | PRN
  Administered 2017-07-19: 50 mg via INTRAVENOUS

## 2017-07-19 MED ORDER — CEFAZOLIN SODIUM-DEXTROSE 2-4 GM/100ML-% IV SOLN
INTRAVENOUS | Status: AC
  Filled 2017-07-19: qty 100

## 2017-07-19 MED ORDER — SCOPOLAMINE 1 MG/3DAYS TD PT72: 1.0000 | MEDICATED_PATCH | Freq: Once | TRANSDERMAL | Status: DC | PRN

## 2017-07-19 MED ORDER — CHLORHEXIDINE GLUCONATE 4 % EX LIQD: 60.0000 mL | Freq: Once | CUTANEOUS | Status: DC

## 2017-07-19 MED ORDER — HYDROCODONE-ACETAMINOPHEN 5-325 MG PO TABS: ORAL_TABLET | ORAL | 0 refills | Status: DC

## 2017-07-19 MED ORDER — LIDOCAINE 2% (20 MG/ML) 5 ML SYRINGE
INTRAMUSCULAR | Status: AC
  Filled 2017-07-19: qty 10

## 2017-07-19 MED ORDER — FENTANYL CITRATE (PF) 100 MCG/2ML IJ SOLN
INTRAMUSCULAR | Status: AC
  Filled 2017-07-19: qty 2

## 2017-07-19 MED ORDER — LACTATED RINGERS IV SOLN
INTRAVENOUS | Status: DC
  Administered 2017-07-19: 09:00:00 via INTRAVENOUS

## 2017-07-19 SURGICAL SUPPLY — 36 items
BANDAGE ACE 3X5.8 VEL STRL LF (GAUZE/BANDAGES/DRESSINGS) ×3 IMPLANT
BLADE SURG 15 STRL LF DISP TIS (BLADE) ×2 IMPLANT
BLADE SURG 15 STRL SS (BLADE) ×4
BNDG ESMARK 4X9 LF (GAUZE/BANDAGES/DRESSINGS) IMPLANT
BNDG GAUZE ELAST 4 BULKY (GAUZE/BANDAGES/DRESSINGS) ×3 IMPLANT
CHLORAPREP W/TINT 26ML (MISCELLANEOUS) ×3 IMPLANT
CORD BIPOLAR FORCEPS 12FT (ELECTRODE) ×3 IMPLANT
COVER BACK TABLE 60X90IN (DRAPES) ×3 IMPLANT
COVER MAYO STAND STRL (DRAPES) ×3 IMPLANT
CUFF TOURNIQUET SINGLE 18IN (TOURNIQUET CUFF) ×3 IMPLANT
DRAPE EXTREMITY T 121X128X90 (DRAPE) ×3 IMPLANT
DRAPE SURG 17X23 STRL (DRAPES) ×3 IMPLANT
DRSG PAD ABDOMINAL 8X10 ST (GAUZE/BANDAGES/DRESSINGS) ×3 IMPLANT
GAUZE SPONGE 4X4 12PLY STRL (GAUZE/BANDAGES/DRESSINGS) ×3 IMPLANT
GAUZE XEROFORM 1X8 LF (GAUZE/BANDAGES/DRESSINGS) ×3 IMPLANT
GLOVE BIO SURGEON STRL SZ 6.5 (GLOVE) ×2 IMPLANT
GLOVE BIO SURGEON STRL SZ7.5 (GLOVE) ×3 IMPLANT
GLOVE BIO SURGEONS STRL SZ 6.5 (GLOVE) ×1
GLOVE BIOGEL PI IND STRL 7.0 (GLOVE) ×2 IMPLANT
GLOVE BIOGEL PI IND STRL 8 (GLOVE) ×1 IMPLANT
GLOVE BIOGEL PI INDICATOR 7.0 (GLOVE) ×4
GLOVE BIOGEL PI INDICATOR 8 (GLOVE) ×2
GOWN STRL REUS W/ TWL LRG LVL3 (GOWN DISPOSABLE) ×1 IMPLANT
GOWN STRL REUS W/TWL LRG LVL3 (GOWN DISPOSABLE) ×2
GOWN STRL REUS W/TWL XL LVL3 (GOWN DISPOSABLE) ×3 IMPLANT
NEEDLE HYPO 25X1 1.5 SAFETY (NEEDLE) ×3 IMPLANT
NS IRRIG 1000ML POUR BTL (IV SOLUTION) ×3 IMPLANT
PACK BASIN DAY SURGERY FS (CUSTOM PROCEDURE TRAY) ×3 IMPLANT
PADDING CAST ABS 4INX4YD NS (CAST SUPPLIES) ×2
PADDING CAST ABS COTTON 4X4 ST (CAST SUPPLIES) ×1 IMPLANT
STOCKINETTE 4X48 STRL (DRAPES) ×3 IMPLANT
SUT ETHILON 4 0 PS 2 18 (SUTURE) ×3 IMPLANT
SYR BULB 3OZ (MISCELLANEOUS) ×3 IMPLANT
SYR CONTROL 10ML LL (SYRINGE) ×3 IMPLANT
TOWEL OR 17X24 6PK STRL BLUE (TOWEL DISPOSABLE) ×6 IMPLANT
UNDERPAD 30X30 (UNDERPADS AND DIAPERS) ×3 IMPLANT
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
## 2017-07-19 NOTE — Discharge Instructions (Addendum)

## 2017-07-19 NOTE — Brief Op Note (Signed)
07/19/2017  10:34 AM  PATIENT:  Austin Arnold  51 y.o. male  PRE-OPERATIVE DIAGNOSIS:  RIGHT CARPAL TUNNEL SYNDROME  POST-OPERATIVE DIAGNOSIS:  RIGHT CARPAL TUNNEL SYNDROME  PROCEDURE:  Procedure(s): RIGHT CARPAL TUNNEL RELEASE (Right)  SURGEON:  Surgeon(s) and Role:    * Betha Loa, MD - Primary  PHYSICIAN ASSISTANT:   ASSISTANTS: none   ANESTHESIA:   IV sedation and Bier block  EBL:  Total I/O In: 500 [I.V.:500] Out: 3 [Blood:3]  BLOOD ADMINISTERED:none  DRAINS: none   LOCAL MEDICATIONS USED:  MARCAINE     SPECIMEN:  No Specimen  DISPOSITION OF SPECIMEN:  N/A  COUNTS:  YES  TOURNIQUET:   Total Tourniquet Time Documented: Forearm (Right) - 34 minutes Total: Forearm (Right) - 34 minutes   DICTATION: .Note written in EPIC  PLAN OF CARE: Discharge to home after PACU  PATIENT DISPOSITION:  PACU - hemodynamically stable.

## 2017-07-19 NOTE — Anesthesia Procedure Notes (Signed)
Anesthesia Regional Block: Bier block (IV Regional)   Pre-Anesthetic Checklist: ,, timeout performed, Correct Patient, Correct Site, Correct Laterality, Correct Procedure,, site marked, surgical consent,, at surgeon's request Needles:  Injection technique: Single-shot  Needle Type: Other      Needle Gauge: 20     Additional Needles:   Procedures:,,,,,,, Esmarch exsanguination, single tourniquet utilized,  Narrative:  Start time: 07/19/2017 9:07 AM End time: 07/19/2017 9:58 AM Injection made incrementally with aspirations every 35 mL.  Performed by: Personally   Additional Notes: Esmark wrap, torq inflated 290, neg pulse, slowly injected 0.5% pres free [place], pt tol well

## 2017-07-19 NOTE — Transfer of Care (Signed)
Immediate Anesthesia Transfer of Care Note  Patient: Austin Arnold  Procedure(s) Performed: Procedure(s): RIGHT CARPAL TUNNEL RELEASE (Right)  Patient Location: PACU  Anesthesia Type:MAC and Bier block  Level of Consciousness: awake and alert   Airway & Oxygen Therapy: Patient Spontanous Breathing and Patient connected to face mask oxygen  Post-op Assessment: Report given to RN and Post -op Vital signs reviewed and stable  Post vital signs: Reviewed and stable  Last Vitals:  Vitals:   07/19/17 0846  BP: 127/70  Pulse: 70  Resp: 20  Temp: 36.7 C    Last Pain:  Vitals:   07/19/17 0846  TempSrc: Oral  PainSc: 10-Worst pain ever         Complications: No apparent anesthesia complications

## 2017-07-19 NOTE — Anesthesia Preprocedure Evaluation (Addendum)
Anesthesia Evaluation  Patient identified by MRN, date of birth, ID band Patient awake    Reviewed: Allergy & Precautions, NPO status , Patient's Chart, lab work & pertinent test results, reviewed documented beta blocker date and time   History of Anesthesia Complications Negative for: history of anesthetic complications  Airway Mallampati: II  TM Distance: >3 FB Neck ROM: Full    Dental  (+) Dental Advisory Given, Edentulous Upper   Pulmonary sleep apnea , Current Smoker, former smoker,    breath sounds clear to auscultation       Cardiovascular hypertension, Pt. on medications + angina + CAD, + Past MI, + Cardiac Stents, + CABG and + Peripheral Vascular Disease   Rhythm:Regular Rate:Normal  Study Conclusions  - Left ventricle: The cavity size was mildly dilated. There was   moderate focal basal hypertrophy. Systolic function was normal.   The estimated ejection fraction was in the range of 60% to 65%.   There is hypokinesis of the apical septal myocardium.    Neuro/Psych PSYCHIATRIC DISORDERS Depression Bipolar Disorder negative neurological ROS     GI/Hepatic Neg liver ROS, GERD  ,  Endo/Other  diabetes, Type 2, Insulin Dependent  Renal/GU negative Renal ROS     Musculoskeletal  (+) Arthritis ,   Abdominal   Peds  Hematology  (+) anemia ,   Anesthesia Other Findings   Reproductive/Obstetrics                            Lab Results  Component Value Date   WBC 8.1 11/25/2016   HGB 13.3 05/28/2017   HCT 39.0 05/28/2017   MCV 85.6 11/25/2016   PLT 231 11/25/2016   Lab Results  Component Value Date   CREATININE 0.93 07/17/2017   BUN 9 07/17/2017   NA 134 (L) 07/17/2017   K 4.7 07/17/2017   CL 102 07/17/2017   CO2 25 07/17/2017   Lab Results  Component Value Date   INR 1.15 11/21/2016   INR 0.90 05/17/2016   INR 1.34 01/20/2016    Anesthesia Physical  Anesthesia  Plan  ASA: III  Anesthesia Plan: MAC and Bier Block   Post-op Pain Management:    Induction: Intravenous  PONV Risk Score and Plan: 1 and Ondansetron and Propofol  Airway Management Planned: LMA  Additional Equipment:   Intra-op Plan:   Post-operative Plan:   Informed Consent: I have reviewed the patients History and Physical, chart, labs and discussed the procedure including the risks, benefits and alternatives for the proposed anesthesia with the patient or authorized representative who has indicated his/her understanding and acceptance.   Dental advisory given  Plan Discussed with: CRNA  Anesthesia Plan Comments: (Some confusion regarding the Brilinta. )       Anesthesia Quick Evaluation

## 2017-07-19 NOTE — Op Note (Signed)
07/19/2017 [facility]                              OPERATIVE REPORT   PREOPERATIVE DIAGNOSIS:  Right carpal tunnel syndrome.  POSTOPERATIVE DIAGNOSIS:  Right carpal tunnel syndrome.  PROCEDURE:  Right carpal tunnel release.  SURGEON:  Betha Loa, MD  ASSISTANT:  none.  ANESTHESIA:  Bier block and sedation.  IV FLUIDS:  Per anesthesia flow sheet.  ESTIMATED BLOOD LOSS:  Minimal.  COMPLICATIONS:  None.  SPECIMENS:  None.  TOURNIQUET TIME:    Total Tourniquet Time Documented: Forearm (Right) - 34 minutes Total: Forearm (Right) - 34 minutes   DISPOSITION:  Stable to PACU.  LOCATION: [facility]  INDICATIONS:  51 yo male with numbness in right hand with nocturnal symptoms and positive nerve conduction studies.   He wishes to have a carpal tunnel release for management of his symptoms.  Risks, benefits and alternatives of surgery were discussed including the risk of blood loss; infection; damage to nerves, vessels, tendons, ligaments, bone; failure of surgery; need for additional surgery; complications with wound healing; continued pain; recurrence of carpal tunnel syndrome; and damage to motor branch. He voiced understanding of these risks and elected to proceed.   OPERATIVE COURSE:  After being identified preoperatively by myself, the patient and I agreed upon the procedure and site of procedure.  The surgical site was marked.  The risks, benefits, and alternatives of the surgery were reviewed and he wished to proceed.  Surgical consent had been signed.  He was given IV Ancef as preoperative antibiotic prophylaxis.  He was transferred to the operating room and placed on the operating room table in supine position with the Right upper extremity on an armboard.  Bier block was induced by Anesthesiology.  Right upper extremity was prepped and draped in normal sterile orthopaedic fashion.  A surgical pause was performed between the surgeons, anesthesia, and  operating room staff, and all were in agreement as to the patient, procedure, and site of procedure.  Tourniquet at the proximal aspect of the forearm  had been inflated for the Bier block.   Incision was made over the transverse carpal ligament and carried into the subcutaneous tissues by spreading technique.  Bipolar electrocautery was used to obtain hemostasis.  The palmar fascia was sharply incised.  The transverse carpal ligament was identified and sharply incised.  It was incised distally first.  Care was taken to ensure complete decompression distally.  It was then incised proximally.  Scissors were used to split the distal aspect of the volar antebrachial fascia.  A finger was placed into the wound to ensure complete decompression, which was the case.  The nerve was examined.  It was flattened and hyperemic.  The motor branch was identified and was intact.  The wound was copiously irrigated with sterile saline.  It was then closed with 4-0 nylon in a horizontal mattress fashion.  It was injected with 0.25% plain Marcaine to aid in postoperative analgesia.  It was dressed with sterile Xeroform, 4x4s, an ABD, and wrapped with Kerlix and an Ace bandage.  Tourniquet was deflated at 34 minutes.  Fingertips were pink with brisk capillary refill after deflation of the tourniquet.  Operative drapes were broken down.  The patient was awoken from anesthesia safely.  He was transferred back to stretcher and taken to the PACU in stable condition.  I will see him back in  the office in 1 week for postoperative followup.  I will give him a prescription for norco 5/325 1-2 tabs PO q6 hours prn pain, dispense #20.    Tami Ribas, MD Electronically signed, 07/19/17

## 2017-07-19 NOTE — Anesthesia Postprocedure Evaluation (Signed)
Anesthesia Post Note  Patient: Austin Arnold, Austin Arnold  Procedure(s) Performed: Procedure(s) (LRB): RIGHT CARPAL TUNNEL RELEASE (Right)     Patient location during evaluation: PACU Anesthesia Type: MAC Level of consciousness: awake and alert Pain management: pain level controlled Vital Signs Assessment: post-procedure vital signs reviewed and stable Respiratory status: spontaneous breathing and respiratory function stable Cardiovascular status: stable Anesthetic complications: no    Last Vitals:  Vitals:   07/19/17 1115 07/19/17 1145  BP: 117/68 129/68  Pulse: 68 62  Resp: 13 16  Temp:  36.4 C    Last Pain:  Vitals:   07/19/17 1200  TempSrc:   PainSc: 0-No pain                 Yaphet Smethurst DANIEL

## 2017-07-20 ENCOUNTER — Encounter (HOSPITAL_BASED_OUTPATIENT_CLINIC_OR_DEPARTMENT_OTHER): Payer: Self-pay | Admitting: Orthopedic Surgery

## 2017-07-22 NOTE — Interval H&P Note (Signed)
Anesthesia H&P Update: History and Physical Exam reviewed; patient is OK for planned anesthetic and procedure. ? ?

## 2017-07-22 NOTE — Addendum Note (Signed)
Addendum  created 07/22/17 0908 by Heather Roberts, MD   Sign clinical note

## 2017-07-24 ENCOUNTER — Encounter: Payer: Self-pay | Admitting: Cardiovascular Disease

## 2017-07-24 NOTE — Telephone Encounter (Signed)
-----   Message from Thurmon Fair, MD sent at 07/20/2017  8:17 AM EDT ----- Regarding: RE: TWILIGHT end of treatment I think best option is lifelong ASA 81 mg and clopidogrel 75 mg daily. Will send Rx to pharmacy. Thanks. MCr ----- Message ----- From: Orrin Brigham, RN Sent: 07/19/2017   1:45 PM To: Thurmon Fair, MD Subject: TWILIGHT end of treatment                      Dr. Royann Shivers,     Mr. Delduca has a research appointment 8/27. At this appointment he will no longer receive ASA/Placebo or Brilinta. Further Antiplatelet therapy will be at your discretion (Including ASA).  A script will need to be sent to his pharmacy.   Thanks [employer]

## 2017-07-26 DIAGNOSIS — F3132 Bipolar disorder, current episode depressed, moderate: Secondary | ICD-10-CM | POA: Diagnosis not present

## 2017-08-07 DIAGNOSIS — F3132 Bipolar disorder, current episode depressed, moderate: Secondary | ICD-10-CM | POA: Diagnosis not present

## 2017-08-13 ENCOUNTER — Other Ambulatory Visit: Payer: Self-pay | Admitting: *Deleted

## 2017-08-13 ENCOUNTER — Encounter: Payer: Self-pay | Admitting: *Deleted

## 2017-08-13 DIAGNOSIS — Z006 Encounter for examination for normal comparison and control in clinical research program: Secondary | ICD-10-CM

## 2017-08-13 MED ORDER — ASPIRIN EC 81 MG PO TBEC: 81.0000 mg | DELAYED_RELEASE_TABLET | Freq: Every day | ORAL | 3 refills | Status: AC

## 2017-08-13 NOTE — Progress Notes (Signed)
I'll make sure we take care of that today. Austin Arnold, clopidogrel 75 mg daily #90, 3 RF, please. MCr

## 2017-08-13 NOTE — Progress Notes (Signed)
TWILIGHT research study visit month 15 completed. Patient denies any bleeding events. After pill count he has been 92 % compliant with ASA/Placebo and 100% compliant with Brilinta. He has been prescribed probable life long Plavix 75 mg and ASA 81 mg. I do not think the script has been called in so I gave him a few Brilinta to hold him over until script picked up. He has 1 remaining TWILIGHT required telephone follow up visit due before 08/DEC/2018. I thanked him for participation. Questions encouraged and answered.

## 2017-08-15 ENCOUNTER — Telehealth: Payer: Self-pay

## 2017-08-15 MED ORDER — CLOPIDOGREL BISULFATE 75 MG PO TABS: 75.0000 mg | ORAL_TABLET | Freq: Every day | ORAL | 3 refills | Status: DC

## 2017-08-15 NOTE — Telephone Encounter (Signed)
Rx(s) sent to pharmacy electronically.  

## 2017-08-15 NOTE — Telephone Encounter (Signed)
Progress Notes by Thurmon Fair, MD at 08/13/2017 10:08 AM   Author: Thurmon Fair, MD Author Type: Physician Filed: 08/13/2017 10:47 AM  Note Status: Signed Cosign: Cosign Not Required Encounter Date: 08/13/2017  Editor: Thurmon Fair, MD (Physician)    I'll make sure we take care of that today. [place], clopidogrel 75 mg daily #90, 3 RF, please. MCr

## 2017-08-28 IMAGING — CR DG CHEST 1V PORT
1 series · 1 of 1 positions shown · non-contrast
Comparison: May 18, 2015

CLINICAL DATA: Chest pain radiating into left arm for several
weeks. Shortness of breath.

EXAM:
PORTABLE CHEST 1 VIEW

[AP]
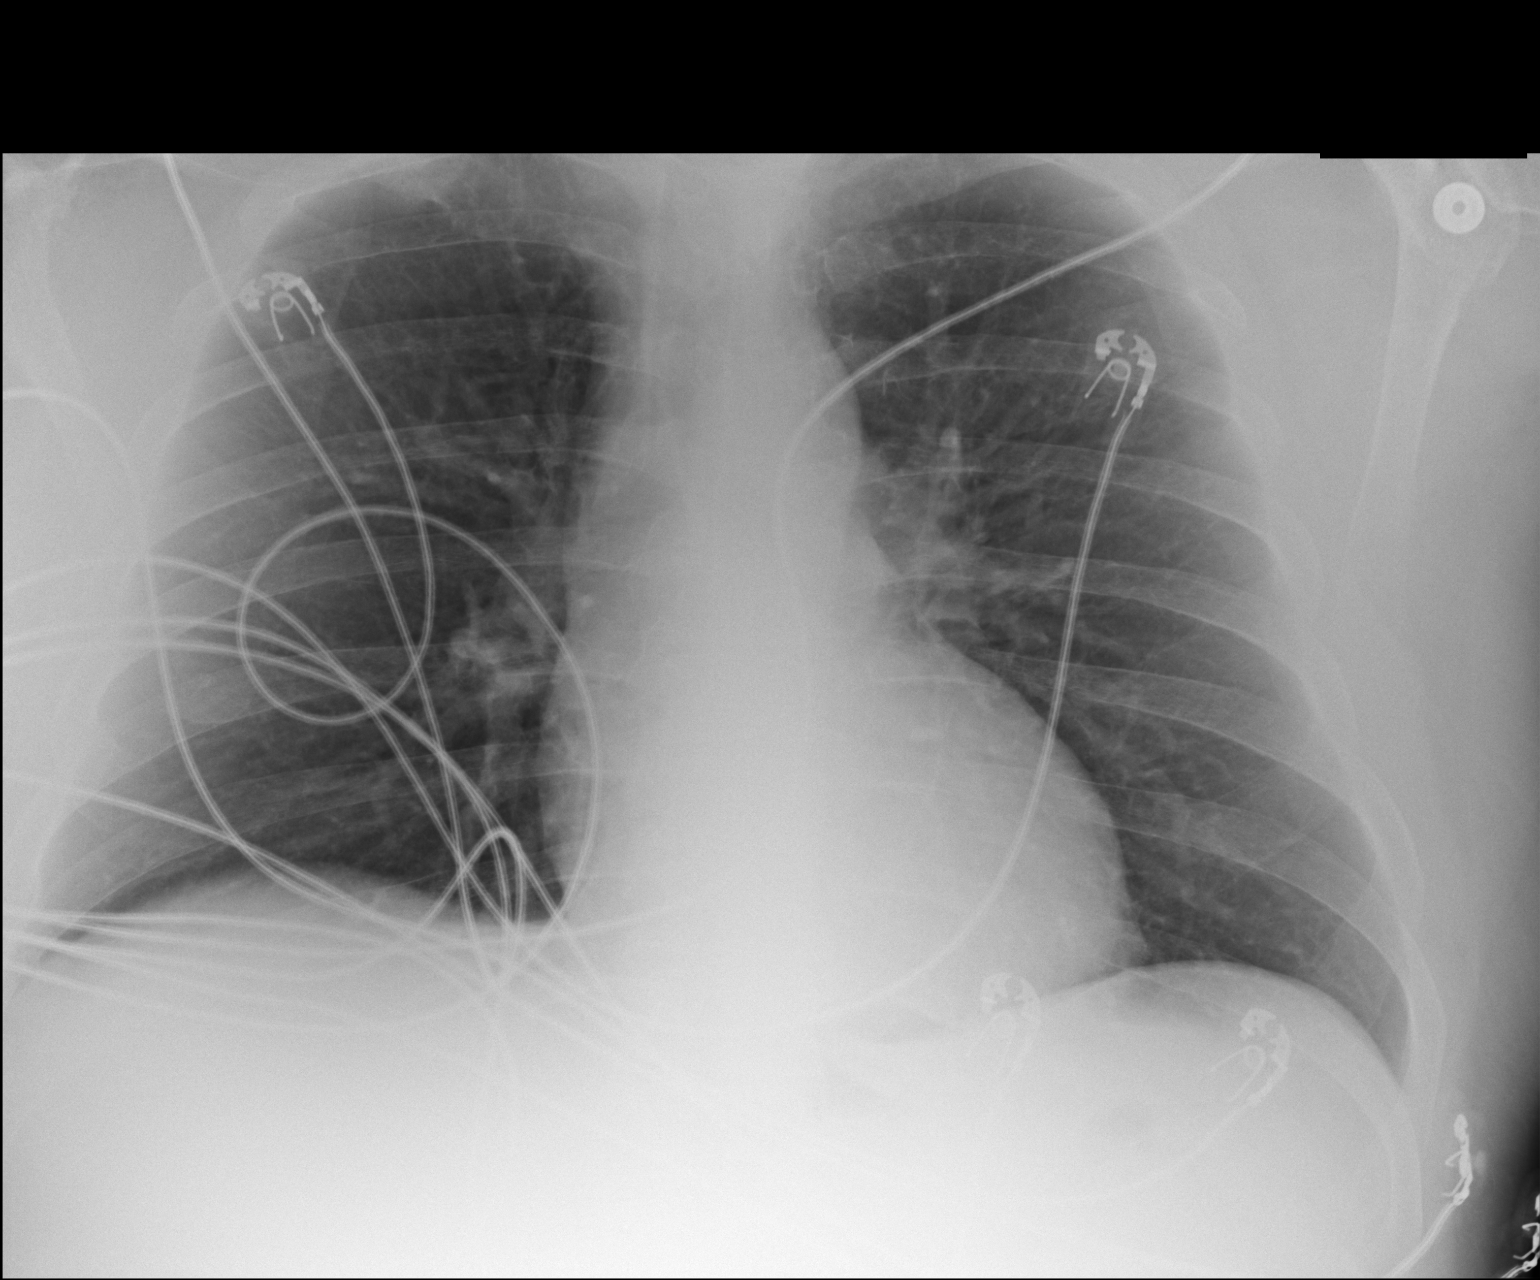

[1 of 1 positions shown; findings below may reference images not displayed]

FINDINGS: Lungs are clear. Heart size and pulmonary vascularity are normal. No
adenopathy. No bone lesions. No pneumothorax.
IMPRESSION: No edema or consolidation.

## 2017-09-07 ENCOUNTER — Encounter: Payer: Self-pay | Admitting: Cardiovascular Disease

## 2017-09-07 ENCOUNTER — Ambulatory Visit (INDEPENDENT_AMBULATORY_CARE_PROVIDER_SITE_OTHER): Payer: Medicare Other | Admitting: Cardiovascular Disease

## 2017-09-07 VITALS — BP 90/64 | HR 67 | Ht 74.0 in | Wt 225.0 lb

## 2017-09-07 DIAGNOSIS — E118 Type 2 diabetes mellitus with unspecified complications: Secondary | ICD-10-CM

## 2017-09-07 DIAGNOSIS — I739 Peripheral vascular disease, unspecified: Secondary | ICD-10-CM | POA: Diagnosis not present

## 2017-09-07 DIAGNOSIS — I25708 Atherosclerosis of coronary artery bypass graft(s), unspecified, with other forms of angina pectoris: Secondary | ICD-10-CM | POA: Diagnosis not present

## 2017-09-07 DIAGNOSIS — I209 Angina pectoris, unspecified: Secondary | ICD-10-CM

## 2017-09-07 DIAGNOSIS — IMO0002 Reserved for concepts with insufficient information to code with codable children: Secondary | ICD-10-CM

## 2017-09-07 DIAGNOSIS — E1165 Type 2 diabetes mellitus with hyperglycemia: Secondary | ICD-10-CM | POA: Diagnosis not present

## 2017-09-07 DIAGNOSIS — E785 Hyperlipidemia, unspecified: Secondary | ICD-10-CM

## 2017-09-07 DIAGNOSIS — I255 Ischemic cardiomyopathy: Secondary | ICD-10-CM

## 2017-09-07 MED ORDER — ISOSORBIDE MONONITRATE ER 30 MG PO TB24: 30.0000 mg | ORAL_TABLET | Freq: Two times a day (BID) | ORAL | 3 refills | Status: DC

## 2017-09-07 MED ORDER — ROSUVASTATIN CALCIUM 40 MG PO TABS: 40.0000 mg | ORAL_TABLET | Freq: Every evening | ORAL | 3 refills | Status: DC

## 2017-09-07 NOTE — Patient Instructions (Signed)
Dr Royann Shivers has recommended making the following medication changes: 1. DECREASE Isosorbide to 30 mg TWICE daily  Your physician recommends that you schedule a follow-up appointment in 6 months. You will receive a reminder letter in the mail two months in advance. If you don't receive a letter, please call our office to schedule the follow-up appointment.  If you need a refill on your cardiac medications before your next appointment, please call your pharmacy.
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
## 2017-09-11 DIAGNOSIS — F3132 Bipolar disorder, current episode depressed, moderate: Secondary | ICD-10-CM | POA: Diagnosis not present

## 2017-09-14 IMAGING — DX DG CHEST 2V
2 series · 2 of 2 positions shown · non-contrast
Comparison: 01/23/2016 and prior chest radiographs

CLINICAL DATA: 49-year-old male with acute chest pain and shortness
of breath for 2 days. Status post CABG 8 days ago.

EXAM:
CHEST  2 VIEW

[w chest pa]
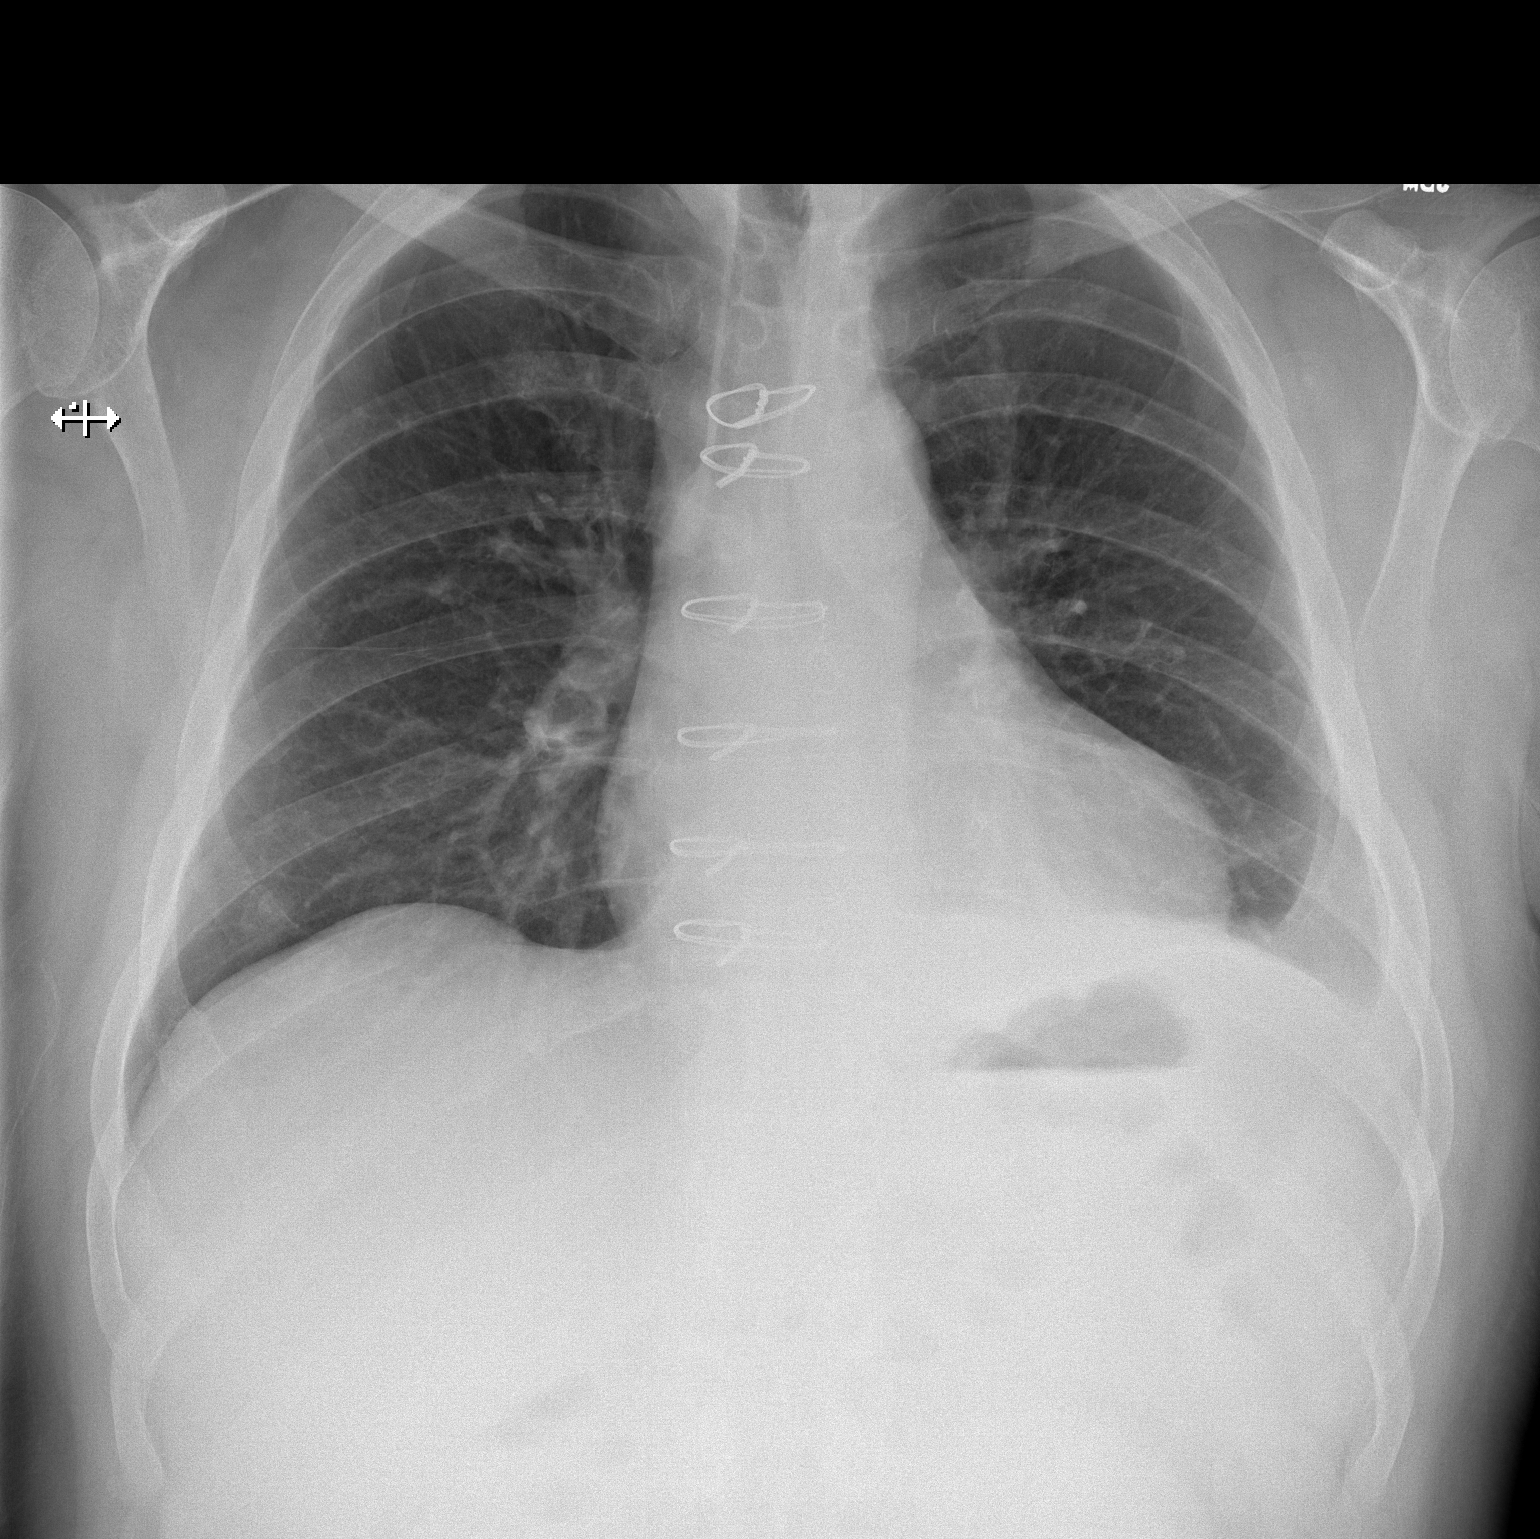

[w chest lat]
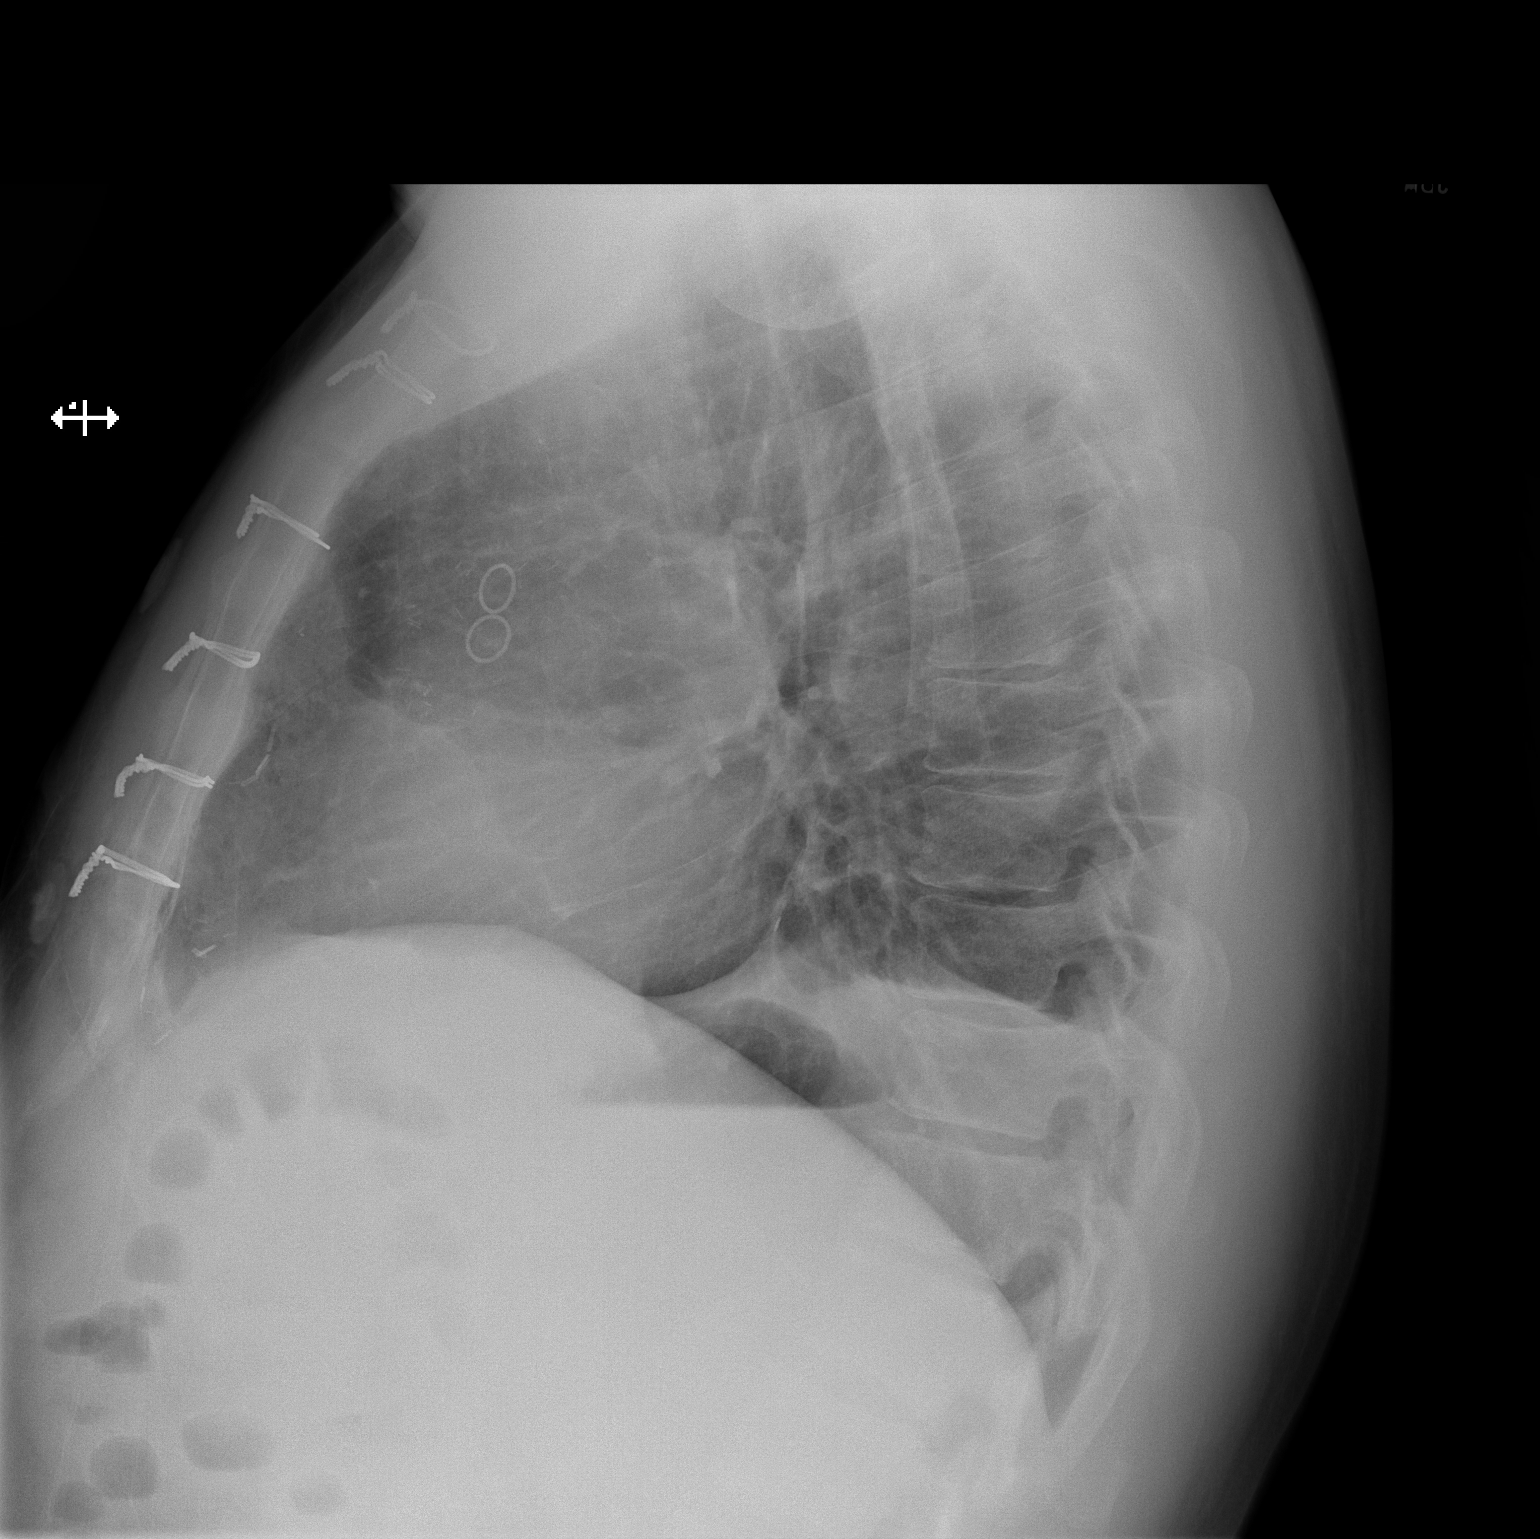

[2 of 2 positions shown; findings below may reference images not displayed]

FINDINGS: Cardiomegaly and CABG changes identified.

Left basilar atelectasis and small left pleural effusion are
improved.

There is no evidence of pneumothorax or pulmonary edema.

No acute bony abnormalities are identified.
IMPRESSION: CABG changes with decreased left basilar atelectasis and small left
effusion.

No acute abnormalities or other changes noted.

## 2017-09-17 DIAGNOSIS — F3132 Bipolar disorder, current episode depressed, moderate: Secondary | ICD-10-CM | POA: Diagnosis not present

## 2017-09-24 DIAGNOSIS — F3132 Bipolar disorder, current episode depressed, moderate: Secondary | ICD-10-CM | POA: Diagnosis not present

## 2017-09-25 IMAGING — CR DG CHEST 2V
2 series · 2 of 2 positions shown · non-contrast
Comparison: Chest x-ray of 01/29/2016

CLINICAL DATA: Post CABG, some anterior chest pain, smoking history

EXAM:
CHEST  2 VIEW

[view not recorded (1 of 2)]
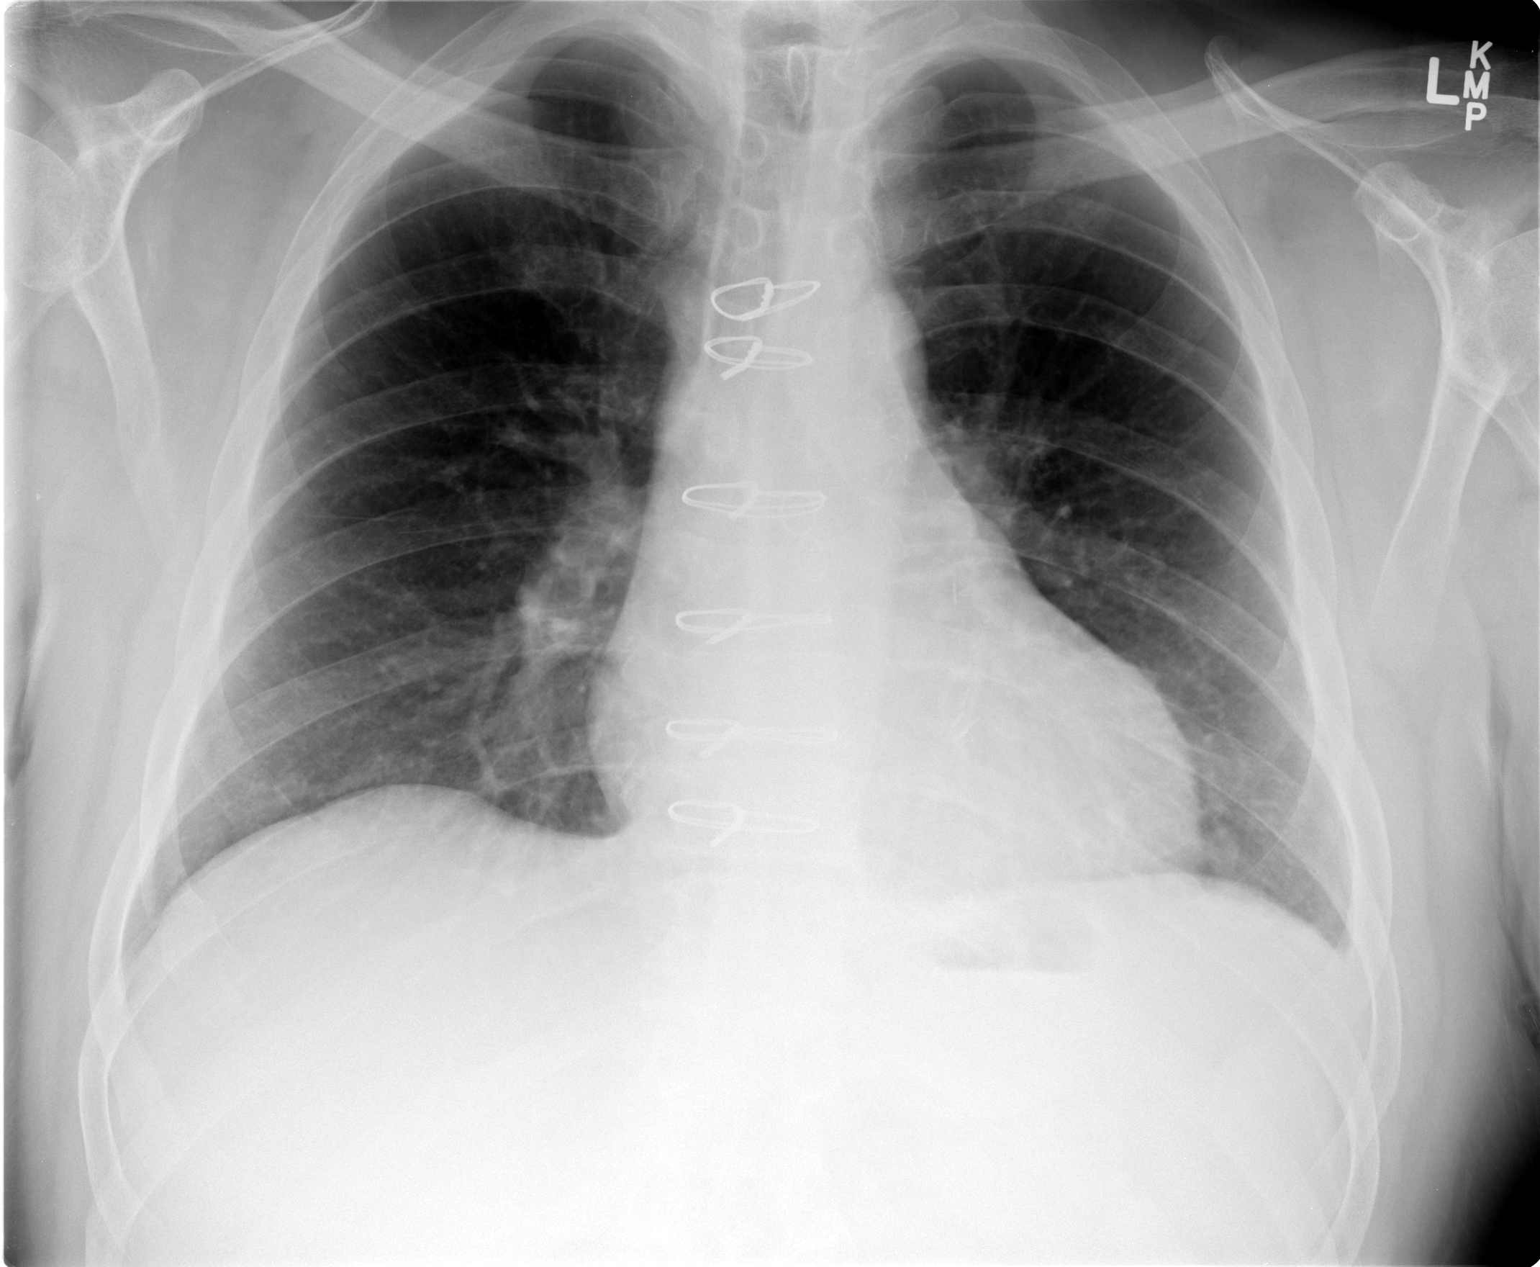

[view not recorded (2 of 2)]
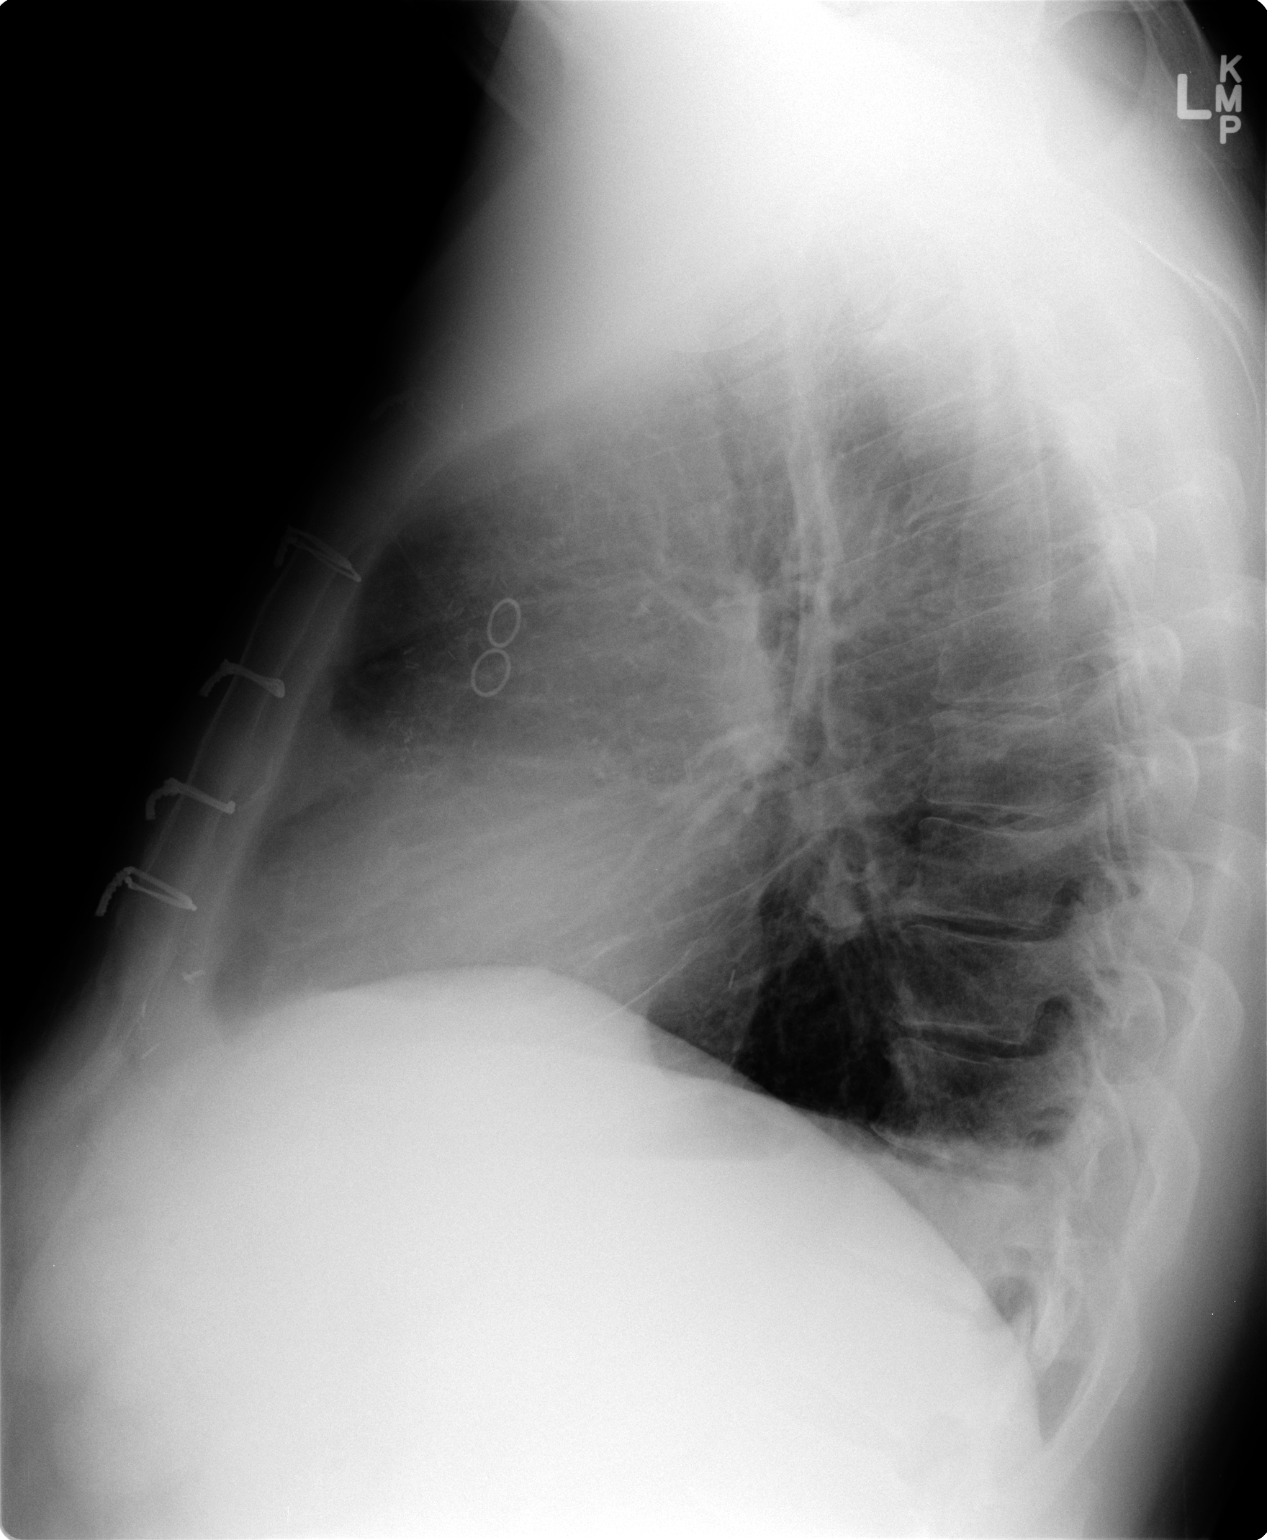

[2 of 2 positions shown; findings below may reference images not displayed]

FINDINGS: The small left pleural effusion has further diminished in volume.
The right lung is clear. No pneumothorax is seen. Mild cardiomegaly
is stable. Median sternotomy sutures are noted from CABG.
IMPRESSION: Decrease in volume of small left pleural effusion.

## 2017-10-08 DIAGNOSIS — F3132 Bipolar disorder, current episode depressed, moderate: Secondary | ICD-10-CM | POA: Diagnosis not present

## 2017-10-10 DIAGNOSIS — G8929 Other chronic pain: Secondary | ICD-10-CM | POA: Diagnosis not present

## 2017-10-10 DIAGNOSIS — M5412 Radiculopathy, cervical region: Secondary | ICD-10-CM | POA: Diagnosis not present

## 2017-10-10 DIAGNOSIS — E119 Type 2 diabetes mellitus without complications: Secondary | ICD-10-CM | POA: Diagnosis not present

## 2017-10-10 DIAGNOSIS — G5601 Carpal tunnel syndrome, right upper limb: Secondary | ICD-10-CM | POA: Diagnosis not present

## 2017-10-10 DIAGNOSIS — M25511 Pain in right shoulder: Secondary | ICD-10-CM | POA: Diagnosis not present

## 2017-10-12 DIAGNOSIS — M25511 Pain in right shoulder: Secondary | ICD-10-CM | POA: Diagnosis not present

## 2017-10-12 DIAGNOSIS — M5412 Radiculopathy, cervical region: Secondary | ICD-10-CM | POA: Diagnosis not present

## 2017-10-12 DIAGNOSIS — G8929 Other chronic pain: Secondary | ICD-10-CM | POA: Diagnosis not present

## 2017-10-12 DIAGNOSIS — G5601 Carpal tunnel syndrome, right upper limb: Secondary | ICD-10-CM | POA: Diagnosis not present

## 2017-10-18 DIAGNOSIS — F3132 Bipolar disorder, current episode depressed, moderate: Secondary | ICD-10-CM | POA: Diagnosis not present

## 2017-11-01 DIAGNOSIS — M7541 Impingement syndrome of right shoulder: Secondary | ICD-10-CM | POA: Diagnosis not present

## 2017-11-01 DIAGNOSIS — M792 Neuralgia and neuritis, unspecified: Secondary | ICD-10-CM | POA: Diagnosis not present

## 2017-11-01 DIAGNOSIS — G894 Chronic pain syndrome: Secondary | ICD-10-CM | POA: Diagnosis not present

## 2017-11-01 DIAGNOSIS — M5412 Radiculopathy, cervical region: Secondary | ICD-10-CM | POA: Diagnosis not present

## 2017-11-01 DIAGNOSIS — M7542 Impingement syndrome of left shoulder: Secondary | ICD-10-CM | POA: Diagnosis not present

## 2017-11-01 DIAGNOSIS — G8929 Other chronic pain: Secondary | ICD-10-CM | POA: Diagnosis not present

## 2017-11-02 DIAGNOSIS — E785 Hyperlipidemia, unspecified: Secondary | ICD-10-CM | POA: Diagnosis not present

## 2017-11-02 DIAGNOSIS — I25708 Atherosclerosis of coronary artery bypass graft(s), unspecified, with other forms of angina pectoris: Secondary | ICD-10-CM | POA: Diagnosis not present

## 2017-11-02 DIAGNOSIS — E538 Deficiency of other specified B group vitamins: Secondary | ICD-10-CM | POA: Diagnosis not present

## 2017-11-02 DIAGNOSIS — M069 Rheumatoid arthritis, unspecified: Secondary | ICD-10-CM | POA: Diagnosis not present

## 2017-11-02 DIAGNOSIS — E1159 Type 2 diabetes mellitus with other circulatory complications: Secondary | ICD-10-CM | POA: Diagnosis not present

## 2017-11-02 DIAGNOSIS — E559 Vitamin D deficiency, unspecified: Secondary | ICD-10-CM | POA: Diagnosis not present

## 2017-11-02 DIAGNOSIS — E1169 Type 2 diabetes mellitus with other specified complication: Secondary | ICD-10-CM | POA: Diagnosis not present

## 2017-11-02 DIAGNOSIS — I10 Essential (primary) hypertension: Secondary | ICD-10-CM | POA: Diagnosis not present

## 2017-11-02 DIAGNOSIS — Z7984 Long term (current) use of oral hypoglycemic drugs: Secondary | ICD-10-CM | POA: Diagnosis not present

## 2017-11-05 DIAGNOSIS — L602 Onychogryphosis: Secondary | ICD-10-CM | POA: Diagnosis not present

## 2017-11-05 DIAGNOSIS — E1351 Other specified diabetes mellitus with diabetic peripheral angiopathy without gangrene: Secondary | ICD-10-CM | POA: Diagnosis not present

## 2017-11-05 DIAGNOSIS — L84 Corns and callosities: Secondary | ICD-10-CM | POA: Diagnosis not present

## 2017-11-05 DIAGNOSIS — M21611 Bunion of right foot: Secondary | ICD-10-CM | POA: Diagnosis not present

## 2017-11-07 ENCOUNTER — Telehealth: Payer: Self-pay | Admitting: *Deleted

## 2017-11-07 NOTE — Telephone Encounter (Signed)
Left message for patient to call research office for 18 month TWILIGHT telephone follow up.

## 2017-11-21 ENCOUNTER — Telehealth: Payer: Self-pay | Admitting: *Deleted

## 2017-11-21 NOTE — Telephone Encounter (Signed)
Left message for patient to call research office for last TWILIGHT Research study follow up.

## 2017-12-03 ENCOUNTER — Encounter: Payer: Self-pay | Admitting: *Deleted

## 2017-12-03 DIAGNOSIS — Z006 Encounter for examination for normal comparison and control in clinical research program: Secondary | ICD-10-CM

## 2017-12-03 NOTE — Progress Notes (Signed)
TWILIGHT Research study month 18 follow up visit completed via phone. Patient continues in ASA/Plavix without any bleeding events. I thanked the patient for his participation in the study.

## 2017-12-13 DIAGNOSIS — G894 Chronic pain syndrome: Secondary | ICD-10-CM | POA: Diagnosis not present

## 2017-12-13 DIAGNOSIS — M791 Myalgia, unspecified site: Secondary | ICD-10-CM | POA: Diagnosis not present

## 2017-12-13 DIAGNOSIS — M792 Neuralgia and neuritis, unspecified: Secondary | ICD-10-CM | POA: Diagnosis not present

## 2017-12-13 DIAGNOSIS — I00 Rheumatic fever without heart involvement: Secondary | ICD-10-CM | POA: Diagnosis not present

## 2018-01-02 DIAGNOSIS — F3132 Bipolar disorder, current episode depressed, moderate: Secondary | ICD-10-CM | POA: Diagnosis not present

## 2018-01-16 DIAGNOSIS — F3132 Bipolar disorder, current episode depressed, moderate: Secondary | ICD-10-CM | POA: Diagnosis not present

## 2018-01-18 DIAGNOSIS — F3132 Bipolar disorder, current episode depressed, moderate: Secondary | ICD-10-CM | POA: Diagnosis not present

## 2018-02-05 DIAGNOSIS — Z6828 Body mass index (BMI) 28.0-28.9, adult: Secondary | ICD-10-CM | POA: Diagnosis not present

## 2018-02-05 DIAGNOSIS — M7989 Other specified soft tissue disorders: Secondary | ICD-10-CM | POA: Diagnosis not present

## 2018-02-05 DIAGNOSIS — M2550 Pain in unspecified joint: Secondary | ICD-10-CM | POA: Diagnosis not present

## 2018-02-05 DIAGNOSIS — E663 Overweight: Secondary | ICD-10-CM | POA: Diagnosis not present

## 2018-02-11 DIAGNOSIS — M0579 Rheumatoid arthritis with rheumatoid factor of multiple sites without organ or systems involvement: Secondary | ICD-10-CM | POA: Diagnosis not present

## 2018-02-11 DIAGNOSIS — E663 Overweight: Secondary | ICD-10-CM | POA: Diagnosis not present

## 2018-02-11 DIAGNOSIS — Z6828 Body mass index (BMI) 28.0-28.9, adult: Secondary | ICD-10-CM | POA: Diagnosis not present

## 2018-02-20 DIAGNOSIS — F3132 Bipolar disorder, current episode depressed, moderate: Secondary | ICD-10-CM | POA: Diagnosis not present

## 2018-02-26 DIAGNOSIS — F3132 Bipolar disorder, current episode depressed, moderate: Secondary | ICD-10-CM | POA: Diagnosis not present

## 2018-03-12 DIAGNOSIS — F3132 Bipolar disorder, current episode depressed, moderate: Secondary | ICD-10-CM | POA: Diagnosis not present

## 2018-03-21 DIAGNOSIS — M0579 Rheumatoid arthritis with rheumatoid factor of multiple sites without organ or systems involvement: Secondary | ICD-10-CM | POA: Diagnosis not present

## 2018-03-21 DIAGNOSIS — E663 Overweight: Secondary | ICD-10-CM | POA: Diagnosis not present

## 2018-03-21 DIAGNOSIS — G894 Chronic pain syndrome: Secondary | ICD-10-CM | POA: Diagnosis not present

## 2018-03-21 DIAGNOSIS — Z6828 Body mass index (BMI) 28.0-28.9, adult: Secondary | ICD-10-CM | POA: Diagnosis not present

## 2018-03-26 DIAGNOSIS — F3132 Bipolar disorder, current episode depressed, moderate: Secondary | ICD-10-CM | POA: Diagnosis not present

## 2018-04-09 DIAGNOSIS — F3132 Bipolar disorder, current episode depressed, moderate: Secondary | ICD-10-CM | POA: Diagnosis not present

## 2018-04-16 DIAGNOSIS — M5412 Radiculopathy, cervical region: Secondary | ICD-10-CM | POA: Diagnosis not present

## 2018-04-16 DIAGNOSIS — M542 Cervicalgia: Secondary | ICD-10-CM | POA: Diagnosis not present

## 2018-04-16 DIAGNOSIS — G894 Chronic pain syndrome: Secondary | ICD-10-CM | POA: Diagnosis not present

## 2018-04-16 DIAGNOSIS — M792 Neuralgia and neuritis, unspecified: Secondary | ICD-10-CM | POA: Diagnosis not present
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
## 2018-04-19 ENCOUNTER — Encounter: Payer: Self-pay | Admitting: Cardiovascular Disease

## 2018-04-19 ENCOUNTER — Ambulatory Visit (INDEPENDENT_AMBULATORY_CARE_PROVIDER_SITE_OTHER): Payer: Medicare Other | Admitting: Cardiovascular Disease

## 2018-04-19 VITALS — BP 86/58 | HR 71 | Ht 74.0 in | Wt 230.8 lb

## 2018-04-19 DIAGNOSIS — I255 Ischemic cardiomyopathy: Secondary | ICD-10-CM

## 2018-04-19 DIAGNOSIS — I25708 Atherosclerosis of coronary artery bypass graft(s), unspecified, with other forms of angina pectoris: Secondary | ICD-10-CM

## 2018-04-19 DIAGNOSIS — E118 Type 2 diabetes mellitus with unspecified complications: Secondary | ICD-10-CM | POA: Diagnosis not present

## 2018-04-19 DIAGNOSIS — IMO0002 Reserved for concepts with insufficient information to code with codable children: Secondary | ICD-10-CM

## 2018-04-19 DIAGNOSIS — E1165 Type 2 diabetes mellitus with hyperglycemia: Secondary | ICD-10-CM | POA: Diagnosis not present

## 2018-04-19 DIAGNOSIS — I739 Peripheral vascular disease, unspecified: Secondary | ICD-10-CM | POA: Diagnosis not present

## 2018-04-19 DIAGNOSIS — E785 Hyperlipidemia, unspecified: Secondary | ICD-10-CM

## 2018-04-19 NOTE — Patient Instructions (Signed)
Dr Royann Shivers has recommended making the following medication changes: 1. STOP Amlodipine  Your physician recommends that you schedule a follow-up appointment in 6 months. You will receive a reminder letter in the mail two months in advance. If you don't receive a letter, please call our office to schedule the follow-up appointment.  If you need a refill on your cardiac medications before your next appointment, please call your pharmacy.

## 2018-04-24 DIAGNOSIS — F3132 Bipolar disorder, current episode depressed, moderate: Secondary | ICD-10-CM | POA: Diagnosis not present

## 2018-04-26 DIAGNOSIS — E559 Vitamin D deficiency, unspecified: Secondary | ICD-10-CM | POA: Diagnosis not present

## 2018-04-26 DIAGNOSIS — E1159 Type 2 diabetes mellitus with other circulatory complications: Secondary | ICD-10-CM | POA: Diagnosis not present

## 2018-04-26 DIAGNOSIS — Z79899 Other long term (current) drug therapy: Secondary | ICD-10-CM | POA: Diagnosis not present

## 2018-04-26 DIAGNOSIS — E785 Hyperlipidemia, unspecified: Secondary | ICD-10-CM | POA: Diagnosis not present

## 2018-04-26 DIAGNOSIS — E1169 Type 2 diabetes mellitus with other specified complication: Secondary | ICD-10-CM | POA: Diagnosis not present

## 2018-04-26 DIAGNOSIS — I10 Essential (primary) hypertension: Secondary | ICD-10-CM | POA: Diagnosis not present

## 2018-04-26 DIAGNOSIS — Z5181 Encounter for therapeutic drug level monitoring: Secondary | ICD-10-CM | POA: Diagnosis not present

## 2018-04-26 DIAGNOSIS — Z7984 Long term (current) use of oral hypoglycemic drugs: Secondary | ICD-10-CM | POA: Diagnosis not present

## 2018-05-07 DIAGNOSIS — F3132 Bipolar disorder, current episode depressed, moderate: Secondary | ICD-10-CM | POA: Diagnosis not present

## 2018-05-21 DIAGNOSIS — F3132 Bipolar disorder, current episode depressed, moderate: Secondary | ICD-10-CM | POA: Diagnosis not present

## 2018-05-28 DIAGNOSIS — F3132 Bipolar disorder, current episode depressed, moderate: Secondary | ICD-10-CM | POA: Diagnosis not present

## 2018-06-05 DIAGNOSIS — F3132 Bipolar disorder, current episode depressed, moderate: Secondary | ICD-10-CM | POA: Diagnosis not present

## 2018-06-07 DIAGNOSIS — F3132 Bipolar disorder, current episode depressed, moderate: Secondary | ICD-10-CM | POA: Diagnosis not present

## 2018-06-14 DIAGNOSIS — Z79899 Other long term (current) drug therapy: Secondary | ICD-10-CM | POA: Diagnosis not present

## 2018-06-14 DIAGNOSIS — G894 Chronic pain syndrome: Secondary | ICD-10-CM | POA: Diagnosis not present

## 2018-06-14 DIAGNOSIS — M069 Rheumatoid arthritis, unspecified: Secondary | ICD-10-CM | POA: Diagnosis not present

## 2018-06-14 DIAGNOSIS — M542 Cervicalgia: Secondary | ICD-10-CM | POA: Diagnosis not present

## 2018-06-14 DIAGNOSIS — M5412 Radiculopathy, cervical region: Secondary | ICD-10-CM | POA: Diagnosis not present

## 2018-06-14 DIAGNOSIS — M792 Neuralgia and neuritis, unspecified: Secondary | ICD-10-CM | POA: Diagnosis not present

## 2018-06-19 DIAGNOSIS — G894 Chronic pain syndrome: Secondary | ICD-10-CM | POA: Diagnosis not present

## 2018-06-19 DIAGNOSIS — M5412 Radiculopathy, cervical region: Secondary | ICD-10-CM | POA: Diagnosis not present

## 2018-06-19 DIAGNOSIS — M792 Neuralgia and neuritis, unspecified: Secondary | ICD-10-CM | POA: Diagnosis not present

## 2018-06-25 DIAGNOSIS — L97529 Non-pressure chronic ulcer of other part of left foot with unspecified severity: Secondary | ICD-10-CM | POA: Diagnosis not present

## 2018-06-25 DIAGNOSIS — L97911 Non-pressure chronic ulcer of unspecified part of right lower leg limited to breakdown of skin: Secondary | ICD-10-CM | POA: Diagnosis not present

## 2018-06-25 DIAGNOSIS — L03032 Cellulitis of left toe: Secondary | ICD-10-CM | POA: Diagnosis not present

## 2018-06-25 DIAGNOSIS — E0843 Diabetes mellitus due to underlying condition with diabetic autonomic (poly)neuropathy: Secondary | ICD-10-CM | POA: Diagnosis not present

## 2018-06-26 DIAGNOSIS — M0579 Rheumatoid arthritis with rheumatoid factor of multiple sites without organ or systems involvement: Secondary | ICD-10-CM | POA: Diagnosis not present

## 2018-06-26 DIAGNOSIS — Z6829 Body mass index (BMI) 29.0-29.9, adult: Secondary | ICD-10-CM | POA: Diagnosis not present

## 2018-06-26 DIAGNOSIS — E663 Overweight: Secondary | ICD-10-CM | POA: Diagnosis not present

## 2018-06-26 DIAGNOSIS — G894 Chronic pain syndrome: Secondary | ICD-10-CM | POA: Diagnosis not present

## 2018-07-02 DIAGNOSIS — E11621 Type 2 diabetes mellitus with foot ulcer: Secondary | ICD-10-CM | POA: Diagnosis not present

## 2018-07-02 DIAGNOSIS — M71352 Other bursal cyst, left hip: Secondary | ICD-10-CM | POA: Diagnosis not present

## 2018-07-02 DIAGNOSIS — M898X5 Other specified disorders of bone, thigh: Secondary | ICD-10-CM | POA: Diagnosis not present

## 2018-07-02 DIAGNOSIS — L97911 Non-pressure chronic ulcer of unspecified part of right lower leg limited to breakdown of skin: Secondary | ICD-10-CM | POA: Diagnosis not present

## 2018-07-02 DIAGNOSIS — L97912 Non-pressure chronic ulcer of unspecified part of right lower leg with fat layer exposed: Secondary | ICD-10-CM | POA: Diagnosis not present

## 2018-07-03 DIAGNOSIS — M5412 Radiculopathy, cervical region: Secondary | ICD-10-CM | POA: Diagnosis not present

## 2018-07-11 DIAGNOSIS — F3132 Bipolar disorder, current episode depressed, moderate: Secondary | ICD-10-CM | POA: Diagnosis not present

## 2018-07-17 DIAGNOSIS — M61052 Myositis ossificans traumatica, left thigh: Secondary | ICD-10-CM | POA: Diagnosis not present

## 2018-07-19 DIAGNOSIS — M61052 Myositis ossificans traumatica, left thigh: Secondary | ICD-10-CM | POA: Diagnosis not present

## 2018-07-19 DIAGNOSIS — B999 Unspecified infectious disease: Secondary | ICD-10-CM | POA: Diagnosis not present

## 2018-07-19 DIAGNOSIS — E119 Type 2 diabetes mellitus without complications: Secondary | ICD-10-CM | POA: Diagnosis not present

## 2018-07-22 ENCOUNTER — Other Ambulatory Visit: Payer: Self-pay | Admitting: Orthopedic Surgery

## 2018-07-22 ENCOUNTER — Encounter: Payer: Self-pay | Admitting: Radiology

## 2018-07-22 ENCOUNTER — Ambulatory Visit
Admission: RE | Admit: 2018-07-22 | Discharge: 2018-07-22 | Disposition: A | Discharge status: Home / Self Care | Payer: Medicare Other | Source: Ambulatory Visit | Attending: Orthopedic Surgery | Admitting: Orthopedic Surgery

## 2018-07-22 DIAGNOSIS — F3132 Bipolar disorder, current episode depressed, moderate: Secondary | ICD-10-CM | POA: Diagnosis not present

## 2018-07-22 DIAGNOSIS — M7989 Other specified soft tissue disorders: Secondary | ICD-10-CM

## 2018-07-22 DIAGNOSIS — R2241 Localized swelling, mass and lump, right lower limb: Secondary | ICD-10-CM | POA: Diagnosis not present

## 2018-07-22 DIAGNOSIS — M898X9 Other specified disorders of bone, unspecified site: Secondary | ICD-10-CM

## 2018-07-22 MED ORDER — IOPAMIDOL (ISOVUE-300) INJECTION 61%
75.0000 mL | Freq: Once | INTRAVENOUS | Status: AC | PRN
  Administered 2018-07-22: 75 mL via INTRAVENOUS

## 2018-07-30 DIAGNOSIS — E7800 Pure hypercholesterolemia, unspecified: Secondary | ICD-10-CM | POA: Diagnosis not present

## 2018-07-30 DIAGNOSIS — F319 Bipolar disorder, unspecified: Secondary | ICD-10-CM | POA: Diagnosis not present

## 2018-07-30 DIAGNOSIS — L97412 Non-pressure chronic ulcer of right heel and midfoot with fat layer exposed: Secondary | ICD-10-CM | POA: Diagnosis not present

## 2018-07-30 DIAGNOSIS — E11621 Type 2 diabetes mellitus with foot ulcer: Secondary | ICD-10-CM | POA: Diagnosis not present

## 2018-07-30 DIAGNOSIS — M069 Rheumatoid arthritis, unspecified: Secondary | ICD-10-CM | POA: Diagnosis not present

## 2018-07-30 DIAGNOSIS — E119 Type 2 diabetes mellitus without complications: Secondary | ICD-10-CM | POA: Diagnosis not present

## 2018-07-30 DIAGNOSIS — I739 Peripheral vascular disease, unspecified: Secondary | ICD-10-CM | POA: Diagnosis not present

## 2018-07-30 DIAGNOSIS — Z955 Presence of coronary angioplasty implant and graft: Secondary | ICD-10-CM | POA: Diagnosis not present

## 2018-07-30 DIAGNOSIS — Z794 Long term (current) use of insulin: Secondary | ICD-10-CM | POA: Diagnosis not present

## 2018-07-30 DIAGNOSIS — Z0000 Encounter for general adult medical examination without abnormal findings: Secondary | ICD-10-CM | POA: Diagnosis not present

## 2018-07-30 DIAGNOSIS — Z951 Presence of aortocoronary bypass graft: Secondary | ICD-10-CM | POA: Diagnosis not present

## 2018-07-30 DIAGNOSIS — E785 Hyperlipidemia, unspecified: Secondary | ICD-10-CM | POA: Diagnosis not present

## 2018-07-30 DIAGNOSIS — I25810 Atherosclerosis of coronary artery bypass graft(s) without angina pectoris: Secondary | ICD-10-CM | POA: Diagnosis not present

## 2018-08-28 DIAGNOSIS — F3132 Bipolar disorder, current episode depressed, moderate: Secondary | ICD-10-CM | POA: Diagnosis not present

## 2018-08-29 ENCOUNTER — Other Ambulatory Visit: Payer: Self-pay | Admitting: Cardiovascular Disease

## 2018-08-29 DIAGNOSIS — Z794 Long term (current) use of insulin: Secondary | ICD-10-CM | POA: Diagnosis not present

## 2018-08-29 DIAGNOSIS — F319 Bipolar disorder, unspecified: Secondary | ICD-10-CM | POA: Diagnosis not present

## 2018-08-29 DIAGNOSIS — M25551 Pain in right hip: Secondary | ICD-10-CM | POA: Diagnosis not present

## 2018-08-29 DIAGNOSIS — E7800 Pure hypercholesterolemia, unspecified: Secondary | ICD-10-CM | POA: Diagnosis not present

## 2018-08-29 DIAGNOSIS — E11621 Type 2 diabetes mellitus with foot ulcer: Secondary | ICD-10-CM | POA: Diagnosis not present

## 2018-08-29 DIAGNOSIS — I25810 Atherosclerosis of coronary artery bypass graft(s) without angina pectoris: Secondary | ICD-10-CM | POA: Diagnosis not present

## 2018-08-29 DIAGNOSIS — E1165 Type 2 diabetes mellitus with hyperglycemia: Secondary | ICD-10-CM | POA: Diagnosis not present

## 2018-08-29 DIAGNOSIS — L97412 Non-pressure chronic ulcer of right heel and midfoot with fat layer exposed: Secondary | ICD-10-CM | POA: Diagnosis not present

## 2018-08-29 NOTE — Telephone Encounter (Signed)
Rx sent to pharmacy   

## 2018-09-04 DIAGNOSIS — F3132 Bipolar disorder, current episode depressed, moderate: Secondary | ICD-10-CM | POA: Diagnosis not present

## 2018-09-05 DIAGNOSIS — F3132 Bipolar disorder, current episode depressed, moderate: Secondary | ICD-10-CM | POA: Diagnosis not present

## 2018-09-19 DIAGNOSIS — F3132 Bipolar disorder, current episode depressed, moderate: Secondary | ICD-10-CM | POA: Diagnosis not present

## 2018-09-20 DIAGNOSIS — M47817 Spondylosis without myelopathy or radiculopathy, lumbosacral region: Secondary | ICD-10-CM | POA: Diagnosis not present

## 2018-09-20 DIAGNOSIS — M1611 Unilateral primary osteoarthritis, right hip: Secondary | ICD-10-CM | POA: Diagnosis not present

## 2018-09-20 DIAGNOSIS — M5441 Lumbago with sciatica, right side: Secondary | ICD-10-CM | POA: Diagnosis not present

## 2018-09-20 DIAGNOSIS — M25551 Pain in right hip: Secondary | ICD-10-CM | POA: Diagnosis not present

## 2018-09-20 DIAGNOSIS — M47816 Spondylosis without myelopathy or radiculopathy, lumbar region: Secondary | ICD-10-CM | POA: Diagnosis not present

## 2018-10-03 DIAGNOSIS — F3132 Bipolar disorder, current episode depressed, moderate: Secondary | ICD-10-CM | POA: Diagnosis not present

## 2018-10-10 ENCOUNTER — Other Ambulatory Visit: Payer: Self-pay

## 2018-10-10 MED ORDER — CLOPIDOGREL BISULFATE 75 MG PO TABS
75.0000 mg | ORAL_TABLET | Freq: Every day | ORAL | 3 refills | Status: AC
Start: 2018-10-10 — End: ?

## 2018-10-21 ENCOUNTER — Ambulatory Visit (INDEPENDENT_AMBULATORY_CARE_PROVIDER_SITE_OTHER): Payer: Medicare Other | Admitting: Cardiovascular Disease

## 2018-10-21 ENCOUNTER — Encounter: Payer: Self-pay | Admitting: Cardiovascular Disease

## 2018-10-21 VITALS — BP 116/72 | HR 66 | Ht 74.0 in | Wt 237.4 lb

## 2018-10-21 DIAGNOSIS — E782 Mixed hyperlipidemia: Secondary | ICD-10-CM

## 2018-10-21 DIAGNOSIS — I255 Ischemic cardiomyopathy: Secondary | ICD-10-CM

## 2018-10-21 DIAGNOSIS — I739 Peripheral vascular disease, unspecified: Secondary | ICD-10-CM | POA: Diagnosis not present

## 2018-10-21 DIAGNOSIS — I25708 Atherosclerosis of coronary artery bypass graft(s), unspecified, with other forms of angina pectoris: Secondary | ICD-10-CM | POA: Diagnosis not present

## 2018-10-21 DIAGNOSIS — E1151 Type 2 diabetes mellitus with diabetic peripheral angiopathy without gangrene: Secondary | ICD-10-CM | POA: Diagnosis not present

## 2018-10-21 DIAGNOSIS — I5042 Chronic combined systolic (congestive) and diastolic (congestive) heart failure: Secondary | ICD-10-CM

## 2018-10-21 MED ORDER — NITROGLYCERIN 0.4 MG SL SUBL: 0.4000 mg | SUBLINGUAL_TABLET | SUBLINGUAL | 3 refills | Status: AC | PRN
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
## 2018-10-21 NOTE — Patient Instructions (Signed)
Medication Instructions:  Dr Croitoru recommends that you continue on your current medications as directed. Please refer to the Current Medication list given to you today.  If you need a refill on your cardiac medications before your next appointment, please call your pharmacy.   Follow-Up: At [facility], you and your health needs are our priority.  As part of our continuing mission to provide you with exceptional heart care, we have created designated Provider Care Teams.  These Care Teams include your primary Cardiologist (physician) and Advanced Practice Providers (APPs -  Physician Assistants and Nurse Practitioners) who all work together to provide you with the care you need, when you need it. You will need a follow up appointment in 6 months.  Please call our office 2 months in advance to schedule this appointment.  You may see Mihai Croitoru, MD or one of the following Advanced Practice Providers on your designated Care Team: Hao Meng, PA-C .  [facility], PA-C 

## 2018-10-22 DIAGNOSIS — F3132 Bipolar disorder, current episode depressed, moderate: Secondary | ICD-10-CM | POA: Diagnosis not present

## 2018-10-22 DIAGNOSIS — E782 Mixed hyperlipidemia: Secondary | ICD-10-CM | POA: Insufficient documentation

## 2018-10-22 DIAGNOSIS — I5042 Chronic combined systolic (congestive) and diastolic (congestive) heart failure: Secondary | ICD-10-CM | POA: Insufficient documentation

## 2018-10-30 DIAGNOSIS — I25119 Atherosclerotic heart disease of native coronary artery with unspecified angina pectoris: Secondary | ICD-10-CM | POA: Diagnosis not present

## 2018-10-30 DIAGNOSIS — E785 Hyperlipidemia, unspecified: Secondary | ICD-10-CM | POA: Diagnosis not present

## 2018-10-30 DIAGNOSIS — Z794 Long term (current) use of insulin: Secondary | ICD-10-CM | POA: Diagnosis not present

## 2018-10-30 DIAGNOSIS — E119 Type 2 diabetes mellitus without complications: Secondary | ICD-10-CM | POA: Diagnosis not present

## 2018-10-30 DIAGNOSIS — G894 Chronic pain syndrome: Secondary | ICD-10-CM | POA: Diagnosis not present

## 2018-10-30 DIAGNOSIS — I739 Peripheral vascular disease, unspecified: Secondary | ICD-10-CM | POA: Diagnosis not present

## 2018-12-04 DIAGNOSIS — E1165 Type 2 diabetes mellitus with hyperglycemia: Secondary | ICD-10-CM | POA: Diagnosis not present

## 2018-12-04 DIAGNOSIS — G894 Chronic pain syndrome: Secondary | ICD-10-CM | POA: Diagnosis not present

## 2018-12-04 DIAGNOSIS — Z794 Long term (current) use of insulin: Secondary | ICD-10-CM | POA: Diagnosis not present

## 2018-12-04 DIAGNOSIS — L97411 Non-pressure chronic ulcer of right heel and midfoot limited to breakdown of skin: Secondary | ICD-10-CM | POA: Diagnosis not present

## 2018-12-04 DIAGNOSIS — E785 Hyperlipidemia, unspecified: Secondary | ICD-10-CM | POA: Diagnosis not present

## 2018-12-04 DIAGNOSIS — M069 Rheumatoid arthritis, unspecified: Secondary | ICD-10-CM | POA: Diagnosis not present

## 2018-12-04 DIAGNOSIS — F319 Bipolar disorder, unspecified: Secondary | ICD-10-CM | POA: Diagnosis not present

## 2018-12-04 DIAGNOSIS — E08621 Diabetes mellitus due to underlying condition with foot ulcer: Secondary | ICD-10-CM | POA: Diagnosis not present

## 2018-12-04 DIAGNOSIS — I25810 Atherosclerosis of coronary artery bypass graft(s) without angina pectoris: Secondary | ICD-10-CM | POA: Diagnosis not present

## 2019-01-12 ENCOUNTER — Other Ambulatory Visit: Payer: Self-pay | Admitting: Cardiology

## 2019-04-04 ENCOUNTER — Other Ambulatory Visit: Payer: Self-pay | Admitting: Cardiovascular Disease

## 2019-04-04 NOTE — Telephone Encounter (Signed)
Rosuvastatin 40 mg refilled. 

## 2019-04-21 ENCOUNTER — Other Ambulatory Visit: Payer: Self-pay | Admitting: *Deleted

## 2019-04-30 ENCOUNTER — Telehealth: Payer: Self-pay | Admitting: Cardiovascular Disease

## 2019-04-30 NOTE — Telephone Encounter (Signed)
Mychart, smartphone, consent sent via mychart msg 04/29/19, pre reg complete 04/30/19 AF

## 2019-05-01 ENCOUNTER — Encounter: Payer: Self-pay | Admitting: Cardiovascular Disease

## 2019-05-01 ENCOUNTER — Telehealth: Payer: Self-pay | Admitting: Cardiovascular Disease

## 2019-05-01 ENCOUNTER — Telehealth (INDEPENDENT_AMBULATORY_CARE_PROVIDER_SITE_OTHER): Payer: Medicare Other | Admitting: Cardiovascular Disease

## 2019-05-01 VITALS — Ht 74.0 in | Wt 220.0 lb

## 2019-05-01 DIAGNOSIS — I208 Other forms of angina pectoris: Secondary | ICD-10-CM | POA: Diagnosis not present

## 2019-05-01 DIAGNOSIS — Z8659 Personal history of other mental and behavioral disorders: Secondary | ICD-10-CM

## 2019-05-01 DIAGNOSIS — IMO0002 Reserved for concepts with insufficient information to code with codable children: Secondary | ICD-10-CM

## 2019-05-01 DIAGNOSIS — I739 Peripheral vascular disease, unspecified: Secondary | ICD-10-CM

## 2019-05-01 DIAGNOSIS — I2089 Other forms of angina pectoris: Secondary | ICD-10-CM

## 2019-05-01 DIAGNOSIS — E785 Hyperlipidemia, unspecified: Secondary | ICD-10-CM

## 2019-05-01 DIAGNOSIS — I25708 Atherosclerosis of coronary artery bypass graft(s), unspecified, with other forms of angina pectoris: Secondary | ICD-10-CM

## 2019-05-01 DIAGNOSIS — E782 Mixed hyperlipidemia: Secondary | ICD-10-CM

## 2019-05-01 DIAGNOSIS — Z955 Presence of coronary angioplasty implant and graft: Secondary | ICD-10-CM

## 2019-05-01 DIAGNOSIS — I5042 Chronic combined systolic (congestive) and diastolic (congestive) heart failure: Secondary | ICD-10-CM

## 2019-05-01 DIAGNOSIS — Z951 Presence of aortocoronary bypass graft: Secondary | ICD-10-CM

## 2019-05-01 DIAGNOSIS — E1151 Type 2 diabetes mellitus with diabetic peripheral angiopathy without gangrene: Secondary | ICD-10-CM

## 2019-05-01 DIAGNOSIS — I255 Ischemic cardiomyopathy: Secondary | ICD-10-CM | POA: Diagnosis not present

## 2019-05-01 DIAGNOSIS — E1165 Type 2 diabetes mellitus with hyperglycemia: Secondary | ICD-10-CM

## 2019-05-01 DIAGNOSIS — I214 Non-ST elevation (NSTEMI) myocardial infarction: Secondary | ICD-10-CM

## 2019-05-01 NOTE — Patient Instructions (Signed)
Medication Instructions:  Continue same medications If you need a refill on your cardiac medications before your next appointment, please call your pharmacy.   Lab work: None ordered  Requested labs from [employer] office   Testing/Procedures: None ordered  Follow-Up: At [facility], you and your health needs are our priority.  As part of our continuing mission to provide you with exceptional heart care, we have created designated Provider Care Teams.  These Care Teams include your primary Cardiologist (physician) and Advanced Practice Providers (APPs -  Physician Assistants and Nurse Practitioners) who all work together to provide you with the care you need, when you need it. . Schedule follow up appointment with Dr.Croitoru in 12 months   Call 3 months before to schedule

## 2019-05-01 NOTE — Telephone Encounter (Signed)
New Message    Pt is waiting for call for his appt   Please call back

## 2019-05-01 NOTE — Telephone Encounter (Signed)
Telehealth visit has been completed.
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
## 2019-06-18 DEATH — deceased

## 2020-07-15 ENCOUNTER — Telehealth: Payer: Self-pay | Admitting: Cardiovascular Disease

## 2020-07-15 NOTE — Telephone Encounter (Signed)
Attempted to contact patient to get follow up scheduled with Croit[facility] from recall list, every number we have for the patient are not in service
